# Patient Record
Sex: Male | Born: 1935 | Race: White | Hispanic: No | Marital: Married | Smoking: Never smoker
Health system: Southern US, Community
[De-identification: ages and names within clinical notes are randomized; demographics above are authoritative.]

## PROBLEM LIST (undated history)

## (undated) DIAGNOSIS — E785 Hyperlipidemia, unspecified: Secondary | ICD-10-CM

## (undated) DIAGNOSIS — J310 Chronic rhinitis: Secondary | ICD-10-CM

## (undated) DIAGNOSIS — J449 Chronic obstructive pulmonary disease, unspecified: Secondary | ICD-10-CM

## (undated) DIAGNOSIS — N189 Chronic kidney disease, unspecified: Secondary | ICD-10-CM

## (undated) DIAGNOSIS — D649 Anemia, unspecified: Secondary | ICD-10-CM

## (undated) DIAGNOSIS — I2510 Atherosclerotic heart disease of native coronary artery without angina pectoris: Secondary | ICD-10-CM

## (undated) DIAGNOSIS — C189 Malignant neoplasm of colon, unspecified: Secondary | ICD-10-CM

## (undated) HISTORY — DX: Anemia, unspecified: D64.9

## (undated) HISTORY — DX: Atherosclerotic heart disease of native coronary artery without angina pectoris: I25.10

## (undated) HISTORY — PX: COLON SURGERY: SHX602

## (undated) HISTORY — DX: Chronic rhinitis: J31.0

## (undated) HISTORY — DX: Chronic obstructive pulmonary disease, unspecified: J44.9

## (undated) HISTORY — DX: Chronic kidney disease, unspecified: N18.9

## (undated) HISTORY — DX: Hyperlipidemia, unspecified: E78.5

## (undated) HISTORY — DX: Malignant neoplasm of colon, unspecified: C18.9

---

## 2004-05-13 HISTORY — PX: COLONOSCOPY: SHX174

## 2005-08-11 ENCOUNTER — Other Ambulatory Visit: Payer: Self-pay

## 2005-08-11 ENCOUNTER — Ambulatory Visit: Payer: Self-pay | Admitting: Orthopedic Surgery

## 2005-08-18 ENCOUNTER — Ambulatory Visit: Payer: Self-pay | Admitting: Orthopedic Surgery

## 2005-09-08 ENCOUNTER — Ambulatory Visit: Payer: Self-pay | Admitting: Orthopedic Surgery

## 2006-12-20 ENCOUNTER — Ambulatory Visit: Payer: Self-pay | Admitting: Ophthalmology

## 2006-12-20 ENCOUNTER — Other Ambulatory Visit: Payer: Self-pay

## 2006-12-27 ENCOUNTER — Ambulatory Visit: Payer: Self-pay | Admitting: Ophthalmology

## 2007-02-15 HISTORY — PX: CORONARY ANGIOPLASTY: SHX604

## 2007-02-22 ENCOUNTER — Inpatient Hospital Stay: Payer: Self-pay | Admitting: Internal Medicine

## 2007-02-22 ENCOUNTER — Other Ambulatory Visit: Payer: Self-pay

## 2007-02-24 ENCOUNTER — Other Ambulatory Visit: Payer: Self-pay

## 2007-02-26 ENCOUNTER — Inpatient Hospital Stay: Payer: Self-pay | Admitting: Internal Medicine

## 2007-02-26 ENCOUNTER — Other Ambulatory Visit: Payer: Self-pay

## 2007-02-27 ENCOUNTER — Other Ambulatory Visit: Payer: Self-pay

## 2007-03-06 ENCOUNTER — Emergency Department: Payer: Self-pay | Admitting: Emergency Medicine

## 2007-11-26 ENCOUNTER — Inpatient Hospital Stay: Payer: Self-pay | Admitting: Internal Medicine

## 2007-11-26 ENCOUNTER — Other Ambulatory Visit: Payer: Self-pay

## 2008-01-25 ENCOUNTER — Emergency Department: Payer: Self-pay

## 2008-09-30 ENCOUNTER — Ambulatory Visit: Payer: Self-pay | Admitting: Family Medicine

## 2008-11-20 ENCOUNTER — Emergency Department: Payer: Self-pay | Admitting: Emergency Medicine

## 2009-08-05 ENCOUNTER — Inpatient Hospital Stay: Payer: Self-pay | Admitting: Internal Medicine

## 2010-12-01 ENCOUNTER — Ambulatory Visit: Payer: Self-pay | Admitting: Ophthalmology

## 2010-12-22 ENCOUNTER — Ambulatory Visit: Payer: Self-pay | Admitting: Ophthalmology

## 2011-02-03 ENCOUNTER — Inpatient Hospital Stay: Payer: Self-pay | Admitting: Internal Medicine

## 2013-12-07 DIAGNOSIS — J449 Chronic obstructive pulmonary disease, unspecified: Secondary | ICD-10-CM | POA: Insufficient documentation

## 2014-04-19 ENCOUNTER — Ambulatory Visit: Payer: Self-pay | Admitting: Specialist

## 2014-05-09 ENCOUNTER — Inpatient Hospital Stay: Payer: Self-pay | Admitting: Internal Medicine

## 2014-05-09 DIAGNOSIS — D649 Anemia, unspecified: Secondary | ICD-10-CM | POA: Insufficient documentation

## 2014-05-09 DIAGNOSIS — I2510 Atherosclerotic heart disease of native coronary artery without angina pectoris: Secondary | ICD-10-CM | POA: Insufficient documentation

## 2014-05-09 DIAGNOSIS — K219 Gastro-esophageal reflux disease without esophagitis: Secondary | ICD-10-CM | POA: Insufficient documentation

## 2014-05-09 DIAGNOSIS — E782 Mixed hyperlipidemia: Secondary | ICD-10-CM | POA: Insufficient documentation

## 2014-05-09 DIAGNOSIS — R0602 Shortness of breath: Secondary | ICD-10-CM | POA: Insufficient documentation

## 2014-05-09 LAB — CBC WITH DIFFERENTIAL/PLATELET
Basophil #: 0.1 10*3/uL (ref 0.0–0.1)
Basophil %: 1.3 %
EOS PCT: 1.1 %
Eosinophil #: 0.1 10*3/uL (ref 0.0–0.7)
HCT: 20.3 % — ABNORMAL LOW (ref 40.0–52.0)
HGB: 5.4 g/dL — AB (ref 13.0–18.0)
Lymphocyte #: 1.2 10*3/uL (ref 1.0–3.6)
Lymphocyte %: 23.9 %
MCH: 14.8 pg — AB (ref 26.0–34.0)
MCHC: 26.6 g/dL — ABNORMAL LOW (ref 32.0–36.0)
MCV: 56 fL — ABNORMAL LOW (ref 80–100)
MONOS PCT: 16.2 %
Monocyte #: 0.8 x10 3/mm (ref 0.2–1.0)
NEUTROS ABS: 3 10*3/uL (ref 1.4–6.5)
Neutrophil %: 57.5 %
Platelet: 194 10*3/uL (ref 150–440)
RBC: 3.63 10*6/uL — ABNORMAL LOW (ref 4.40–5.90)
RDW: 20.5 % — ABNORMAL HIGH (ref 11.5–14.5)
WBC: 5.2 10*3/uL (ref 3.8–10.6)

## 2014-05-09 LAB — FERRITIN: Ferritin (ARMC): 3 ng/mL — ABNORMAL LOW (ref 8–388)

## 2014-05-09 LAB — IRON AND TIBC
IRON BIND. CAP.(TOTAL): 453 ug/dL — AB (ref 250–450)
IRON SATURATION: 4 %
Iron: 16 ug/dL — ABNORMAL LOW (ref 65–175)
Unbound Iron-Bind.Cap.: 437 ug/dL

## 2014-05-09 LAB — COMPREHENSIVE METABOLIC PANEL
ALT: 20 U/L
ANION GAP: 8 (ref 7–16)
Albumin: 3.4 g/dL (ref 3.4–5.0)
Alkaline Phosphatase: 56 U/L
BILIRUBIN TOTAL: 0.5 mg/dL (ref 0.2–1.0)
BUN: 18 mg/dL (ref 7–18)
CHLORIDE: 106 mmol/L (ref 98–107)
CO2: 27 mmol/L (ref 21–32)
CREATININE: 1.23 mg/dL (ref 0.60–1.30)
Calcium, Total: 8.7 mg/dL (ref 8.5–10.1)
EGFR (Non-African Amer.): 58 — ABNORMAL LOW
Glucose: 84 mg/dL (ref 65–99)
OSMOLALITY: 282 (ref 275–301)
POTASSIUM: 4.2 mmol/L (ref 3.5–5.1)
SGOT(AST): 31 U/L (ref 15–37)
Sodium: 141 mmol/L (ref 136–145)
TOTAL PROTEIN: 7 g/dL (ref 6.4–8.2)

## 2014-05-09 LAB — TROPONIN I: Troponin-I: <0.02 ng/mL

## 2014-05-09 LAB — FOLATE: Folic Acid: 65.4 ng/mL — ABNORMAL HIGH (ref 3.1–17.5)

## 2014-05-10 LAB — CBC WITH DIFFERENTIAL/PLATELET
BASOS PCT: 1.5 %
Basophil #: 0.1 10*3/uL (ref 0.0–0.1)
EOS ABS: 0.2 10*3/uL (ref 0.0–0.7)
Eosinophil %: 3.1 %
HCT: 21.4 % — ABNORMAL LOW (ref 40.0–52.0)
HGB: 6.2 g/dL — ABNORMAL LOW (ref 13.0–18.0)
LYMPHS PCT: 19.6 %
Lymphocyte #: 1.3 10*3/uL (ref 1.0–3.6)
MCH: 17.5 pg — ABNORMAL LOW (ref 26.0–34.0)
MCHC: 28.7 g/dL — AB (ref 32.0–36.0)
MCV: 61 fL — AB (ref 80–100)
Monocyte #: 1.3 x10 3/mm — ABNORMAL HIGH (ref 0.2–1.0)
Monocyte %: 19.7 %
Neutrophil #: 3.6 10*3/uL (ref 1.4–6.5)
Neutrophil %: 56.1 %
Platelet: 149 10*3/uL — ABNORMAL LOW (ref 150–440)
RBC: 3.53 10*6/uL — ABNORMAL LOW (ref 4.40–5.90)
RDW: 25.6 % — ABNORMAL HIGH (ref 11.5–14.5)
WBC: 6.5 10*3/uL (ref 3.8–10.6)

## 2014-05-10 LAB — OCCULT BLOOD X 1 CARD TO LAB, STOOL: OCCULT BLOOD, FECES: POSITIVE

## 2014-05-11 HISTORY — PX: ESOPHAGOGASTRODUODENOSCOPY: SHX1529

## 2014-05-11 LAB — CBC WITH DIFFERENTIAL/PLATELET
BASOS ABS: 0.1 10*3/uL (ref 0.0–0.1)
Basophil %: 1.7 %
EOS ABS: 0.3 10*3/uL (ref 0.0–0.7)
Eosinophil %: 4.1 %
HCT: 25.4 % — AB (ref 40.0–52.0)
HGB: 7.2 g/dL — AB (ref 13.0–18.0)
LYMPHS PCT: 21.5 %
Lymphocyte #: 1.7 10*3/uL (ref 1.0–3.6)
MCH: 18.1 pg — AB (ref 26.0–34.0)
MCHC: 28.2 g/dL — AB (ref 32.0–36.0)
MCV: 64 fL — AB (ref 80–100)
Monocyte #: 1.7 x10 3/mm — ABNORMAL HIGH (ref 0.2–1.0)
Monocyte %: 20.8 %
Neutrophil #: 4.1 10*3/uL (ref 1.4–6.5)
Neutrophil %: 51.9 %
Platelet: 158 10*3/uL (ref 150–440)
RBC: 3.97 10*6/uL — ABNORMAL LOW (ref 4.40–5.90)
RDW: 28.6 % — ABNORMAL HIGH (ref 11.5–14.5)
WBC: 7.9 10*3/uL (ref 3.8–10.6)

## 2014-05-12 LAB — CBC WITH DIFFERENTIAL/PLATELET
BASOS ABS: 0.1 10*3/uL (ref 0.0–0.1)
Basophil %: 1.1 %
EOS PCT: 3.3 %
Eosinophil #: 0.3 10*3/uL (ref 0.0–0.7)
HCT: 28.6 % — AB (ref 40.0–52.0)
HGB: 8.4 g/dL — ABNORMAL LOW (ref 13.0–18.0)
LYMPHS ABS: 1.4 10*3/uL (ref 1.0–3.6)
Lymphocyte %: 13.4 %
MCH: 19.4 pg — ABNORMAL LOW (ref 26.0–34.0)
MCHC: 29.3 g/dL — AB (ref 32.0–36.0)
MCV: 66 fL — ABNORMAL LOW (ref 80–100)
Monocyte #: 1.4 x10 3/mm — ABNORMAL HIGH (ref 0.2–1.0)
Monocyte %: 13.2 %
Neutrophil #: 7.3 10*3/uL — ABNORMAL HIGH (ref 1.4–6.5)
Neutrophil %: 69 %
Platelet: 165 10*3/uL (ref 150–440)
RBC: 4.33 10*6/uL — ABNORMAL LOW (ref 4.40–5.90)
RDW: 32.2 % — ABNORMAL HIGH (ref 11.5–14.5)
WBC: 10.6 10*3/uL (ref 3.8–10.6)

## 2014-05-13 LAB — CBC WITH DIFFERENTIAL/PLATELET
BASOS ABS: 0.1 10*3/uL (ref 0.0–0.1)
Basophil %: 1 %
EOS ABS: 0.2 10*3/uL (ref 0.0–0.7)
Eosinophil %: 2 %
HCT: 29.7 % — ABNORMAL LOW (ref 40.0–52.0)
HGB: 8.7 g/dL — ABNORMAL LOW (ref 13.0–18.0)
LYMPHS ABS: 1 10*3/uL (ref 1.0–3.6)
LYMPHS PCT: 11.7 %
MCH: 19.3 pg — ABNORMAL LOW (ref 26.0–34.0)
MCHC: 29.2 g/dL — ABNORMAL LOW (ref 32.0–36.0)
MCV: 66 fL — ABNORMAL LOW (ref 80–100)
MONO ABS: 1.3 x10 3/mm — AB (ref 0.2–1.0)
Monocyte %: 15.6 %
NEUTROS PCT: 69.7 %
Neutrophil #: 5.8 10*3/uL (ref 1.4–6.5)
Platelet: 172 10*3/uL (ref 150–440)
RBC: 4.49 10*6/uL (ref 4.40–5.90)
RDW: 33 % — ABNORMAL HIGH (ref 11.5–14.5)
WBC: 8.2 10*3/uL (ref 3.8–10.6)

## 2014-05-14 HISTORY — PX: FLEXIBLE SIGMOIDOSCOPY: SHX1649

## 2014-05-14 LAB — HEPATIC FUNCTION PANEL A (ARMC)
AST: 24 U/L (ref 15–37)
Albumin: 2.9 g/dL — ABNORMAL LOW (ref 3.4–5.0)
Alkaline Phosphatase: 60 U/L
BILIRUBIN DIRECT: 0.2 mg/dL (ref 0.0–0.2)
Bilirubin,Total: 0.9 mg/dL (ref 0.2–1.0)
SGPT (ALT): 16 U/L
TOTAL PROTEIN: 6.2 g/dL — AB (ref 6.4–8.2)

## 2014-05-14 LAB — CBC WITH DIFFERENTIAL/PLATELET
Basophil #: 0.1 10*3/uL (ref 0.0–0.1)
Basophil %: 1 %
Eosinophil #: 0.1 10*3/uL (ref 0.0–0.7)
Eosinophil %: 1.2 %
HCT: 28.2 % — ABNORMAL LOW (ref 40.0–52.0)
HGB: 8.2 g/dL — AB (ref 13.0–18.0)
Lymphocyte #: 1 10*3/uL (ref 1.0–3.6)
Lymphocyte %: 11.7 %
MCH: 19.4 pg — AB (ref 26.0–34.0)
MCHC: 29.2 g/dL — AB (ref 32.0–36.0)
MCV: 66 fL — ABNORMAL LOW (ref 80–100)
MONO ABS: 1.5 x10 3/mm — AB (ref 0.2–1.0)
MONOS PCT: 16.7 %
NEUTROS ABS: 6 10*3/uL (ref 1.4–6.5)
NEUTROS PCT: 69.4 %
PLATELETS: 149 10*3/uL — AB (ref 150–440)
RBC: 4.25 10*6/uL — AB (ref 4.40–5.90)
RDW: 33.8 % — ABNORMAL HIGH (ref 11.5–14.5)
WBC: 8.7 10*3/uL (ref 3.8–10.6)

## 2014-05-14 LAB — BASIC METABOLIC PANEL
Anion Gap: 7 (ref 7–16)
BUN: 7 mg/dL (ref 7–18)
CREATININE: 1.17 mg/dL (ref 0.60–1.30)
Calcium, Total: 8.2 mg/dL — ABNORMAL LOW (ref 8.5–10.1)
Chloride: 109 mmol/L — ABNORMAL HIGH (ref 98–107)
Co2: 26 mmol/L (ref 21–32)
EGFR (Non-African Amer.): >60
Glucose: 79 mg/dL (ref 65–99)
OSMOLALITY: 280 (ref 275–301)
POTASSIUM: 3.6 mmol/L (ref 3.5–5.1)
Sodium: 142 mmol/L (ref 136–145)

## 2014-05-15 LAB — HEMOGLOBIN: HGB: 8.7 g/dL — AB (ref 13.0–18.0)

## 2014-05-16 LAB — BASIC METABOLIC PANEL
Anion Gap: 7 (ref 7–16)
BUN: 11 mg/dL (ref 7–18)
CHLORIDE: 108 mmol/L — AB (ref 98–107)
Calcium, Total: 8.7 mg/dL (ref 8.5–10.1)
Co2: 26 mmol/L (ref 21–32)
Creatinine: 1.23 mg/dL (ref 0.60–1.30)
EGFR (African American): >60
EGFR (Non-African Amer.): 58 — ABNORMAL LOW
Glucose: 92 mg/dL (ref 65–99)
OSMOLALITY: 280 (ref 275–301)
Potassium: 3.6 mmol/L (ref 3.5–5.1)
Sodium: 141 mmol/L (ref 136–145)

## 2014-05-16 LAB — URIC ACID: Uric Acid: 6 mg/dL (ref 3.5–7.2)

## 2014-05-16 LAB — CBC WITH DIFFERENTIAL/PLATELET
BASOS ABS: 0 10*3/uL (ref 0.0–0.1)
Basophil %: 0.4 %
EOS ABS: 0 10*3/uL (ref 0.0–0.7)
Eosinophil %: 0.5 %
HCT: 29.2 % — AB (ref 40.0–52.0)
HGB: 8.5 g/dL — ABNORMAL LOW (ref 13.0–18.0)
LYMPHS ABS: 1.3 10*3/uL (ref 1.0–3.6)
Lymphocyte %: 17.7 %
MCH: 19.5 pg — ABNORMAL LOW (ref 26.0–34.0)
MCHC: 29.2 g/dL — ABNORMAL LOW (ref 32.0–36.0)
MCV: 67 fL — AB (ref 80–100)
Monocyte #: 1.2 x10 3/mm — ABNORMAL HIGH (ref 0.2–1.0)
Monocyte %: 15.4 %
Neutrophil #: 5 10*3/uL (ref 1.4–6.5)
Neutrophil %: 66 %
PLATELETS: 132 10*3/uL — AB (ref 150–440)
RBC: 4.36 10*6/uL — ABNORMAL LOW (ref 4.40–5.90)
RDW: 33.5 % — AB (ref 11.5–14.5)
WBC: 7.5 10*3/uL (ref 3.8–10.6)

## 2014-05-16 LAB — PROTIME-INR
INR: 1.1
Prothrombin Time: 14.2 secs (ref 11.5–14.7)

## 2014-05-16 LAB — CEA: CEA: 2 ng/mL (ref 0.0–4.7)

## 2014-05-17 ENCOUNTER — Ambulatory Visit: Payer: Self-pay | Admitting: Oncology

## 2014-05-17 DIAGNOSIS — K922 Gastrointestinal hemorrhage, unspecified: Secondary | ICD-10-CM | POA: Diagnosis not present

## 2014-05-17 DIAGNOSIS — D649 Anemia, unspecified: Secondary | ICD-10-CM | POA: Diagnosis not present

## 2014-05-17 DIAGNOSIS — R4182 Altered mental status, unspecified: Secondary | ICD-10-CM | POA: Diagnosis not present

## 2014-05-17 DIAGNOSIS — C189 Malignant neoplasm of colon, unspecified: Secondary | ICD-10-CM | POA: Diagnosis not present

## 2014-05-17 LAB — COMPREHENSIVE METABOLIC PANEL
ALBUMIN: 2.5 g/dL — AB (ref 3.4–5.0)
ANION GAP: 3 — AB (ref 7–16)
AST: 20 U/L (ref 15–37)
Alkaline Phosphatase: 51 U/L
BILIRUBIN TOTAL: 0.5 mg/dL (ref 0.2–1.0)
BUN: 14 mg/dL (ref 7–18)
CHLORIDE: 109 mmol/L — AB (ref 98–107)
CREATININE: 1.54 mg/dL — AB (ref 0.60–1.30)
Calcium, Total: 7.7 mg/dL — ABNORMAL LOW (ref 8.5–10.1)
Co2: 28 mmol/L (ref 21–32)
EGFR (African American): 55 — ABNORMAL LOW
EGFR (Non-African Amer.): 45 — ABNORMAL LOW
GLUCOSE: 119 mg/dL — AB (ref 65–99)
OSMOLALITY: 281 (ref 275–301)
POTASSIUM: 4.3 mmol/L (ref 3.5–5.1)
SGPT (ALT): 16 U/L
SODIUM: 140 mmol/L (ref 136–145)
Total Protein: 5.8 g/dL — ABNORMAL LOW (ref 6.4–8.2)

## 2014-05-17 LAB — CBC WITH DIFFERENTIAL/PLATELET
BASOS ABS: 1 %
Bands: 3 %
Eosinophil: 1 %
HCT: 27.9 % — AB (ref 40.0–52.0)
HGB: 7.9 g/dL — AB (ref 13.0–18.0)
LYMPHS PCT: 4 %
MCH: 19.3 pg — ABNORMAL LOW (ref 26.0–34.0)
MCHC: 28.5 g/dL — ABNORMAL LOW (ref 32.0–36.0)
MCV: 68 fL — ABNORMAL LOW (ref 80–100)
MONOS PCT: 8 %
Platelet: 141 10*3/uL — ABNORMAL LOW (ref 150–440)
RBC: 4.12 10*6/uL — ABNORMAL LOW (ref 4.40–5.90)
RDW: 34.2 % — AB (ref 11.5–14.5)
SEGMENTED NEUTROPHILS: 83 %
WBC: 27 10*3/uL — AB (ref 3.8–10.6)

## 2014-05-18 DIAGNOSIS — K922 Gastrointestinal hemorrhage, unspecified: Secondary | ICD-10-CM | POA: Diagnosis not present

## 2014-05-18 DIAGNOSIS — R4182 Altered mental status, unspecified: Secondary | ICD-10-CM | POA: Diagnosis not present

## 2014-05-18 DIAGNOSIS — D649 Anemia, unspecified: Secondary | ICD-10-CM | POA: Diagnosis not present

## 2014-05-18 DIAGNOSIS — C189 Malignant neoplasm of colon, unspecified: Secondary | ICD-10-CM | POA: Diagnosis not present

## 2014-05-18 LAB — CBC WITH DIFFERENTIAL/PLATELET
BASOS PCT: 0.6 %
Basophil #: 0.1 10*3/uL (ref 0.0–0.1)
EOS PCT: 0.5 %
Eosinophil #: 0.1 10*3/uL (ref 0.0–0.7)
HCT: 23.3 % — AB (ref 40.0–52.0)
HGB: 6.6 g/dL — ABNORMAL LOW (ref 13.0–18.0)
LYMPHS ABS: 1 10*3/uL (ref 1.0–3.6)
Lymphocyte %: 7.6 %
MCH: 19.4 pg — ABNORMAL LOW (ref 26.0–34.0)
MCHC: 28.4 g/dL — ABNORMAL LOW (ref 32.0–36.0)
MCV: 68 fL — AB (ref 80–100)
Monocyte #: 2.4 x10 3/mm — ABNORMAL HIGH (ref 0.2–1.0)
Monocyte %: 17.3 %
NEUTROS ABS: 10 10*3/uL — AB (ref 1.4–6.5)
Neutrophil %: 74 %
PLATELETS: 116 10*3/uL — AB (ref 150–440)
RBC: 3.42 10*6/uL — AB (ref 4.40–5.90)
RDW: 33.2 % — ABNORMAL HIGH (ref 11.5–14.5)
WBC: 13.6 10*3/uL — ABNORMAL HIGH (ref 3.8–10.6)

## 2014-05-18 LAB — BASIC METABOLIC PANEL
Anion Gap: 5 — ABNORMAL LOW (ref 7–16)
BUN: 13 mg/dL (ref 7–18)
CHLORIDE: 107 mmol/L (ref 98–107)
CO2: 26 mmol/L (ref 21–32)
CREATININE: 1.15 mg/dL (ref 0.60–1.30)
Calcium, Total: 7.5 mg/dL — ABNORMAL LOW (ref 8.5–10.1)
EGFR (African American): >60
EGFR (Non-African Amer.): >60
Glucose: 91 mg/dL (ref 65–99)
Osmolality: 275 (ref 275–301)
Potassium: 3.9 mmol/L (ref 3.5–5.1)
SODIUM: 138 mmol/L (ref 136–145)

## 2014-05-19 DIAGNOSIS — R4182 Altered mental status, unspecified: Secondary | ICD-10-CM | POA: Diagnosis not present

## 2014-05-19 DIAGNOSIS — C189 Malignant neoplasm of colon, unspecified: Secondary | ICD-10-CM | POA: Diagnosis not present

## 2014-05-19 DIAGNOSIS — K922 Gastrointestinal hemorrhage, unspecified: Secondary | ICD-10-CM | POA: Diagnosis not present

## 2014-05-19 DIAGNOSIS — D649 Anemia, unspecified: Secondary | ICD-10-CM | POA: Diagnosis not present

## 2014-05-19 LAB — CBC WITH DIFFERENTIAL/PLATELET
BASOS PCT: 0.6 %
Basophil #: 0.1 10*3/uL (ref 0.0–0.1)
EOS PCT: 2.6 %
Eosinophil #: 0.2 10*3/uL (ref 0.0–0.7)
HCT: 28.1 % — AB (ref 40.0–52.0)
HGB: 8.4 g/dL — AB (ref 13.0–18.0)
LYMPHS ABS: 0.8 10*3/uL — AB (ref 1.0–3.6)
Lymphocyte %: 8.7 %
MCH: 21.5 pg — AB (ref 26.0–34.0)
MCHC: 30 g/dL — ABNORMAL LOW (ref 32.0–36.0)
MCV: 71 fL — AB (ref 80–100)
Monocyte #: 1.4 x10 3/mm — ABNORMAL HIGH (ref 0.2–1.0)
Monocyte %: 14.9 %
Neutrophil #: 6.9 10*3/uL — ABNORMAL HIGH (ref 1.4–6.5)
Neutrophil %: 73.2 %
Platelet: 132 10*3/uL — ABNORMAL LOW (ref 150–440)
RBC: 3.93 10*6/uL — AB (ref 4.40–5.90)
RDW: 33.5 % — ABNORMAL HIGH (ref 11.5–14.5)
WBC: 9.5 10*3/uL (ref 3.8–10.6)

## 2014-05-19 LAB — BASIC METABOLIC PANEL
Anion Gap: 5 — ABNORMAL LOW (ref 7–16)
BUN: 9 mg/dL (ref 7–18)
CHLORIDE: 103 mmol/L (ref 98–107)
CO2: 29 mmol/L (ref 21–32)
CREATININE: 1.1 mg/dL (ref 0.60–1.30)
Calcium, Total: 7.7 mg/dL — ABNORMAL LOW (ref 8.5–10.1)
EGFR (African American): >60
EGFR (Non-African Amer.): >60
Glucose: 97 mg/dL (ref 65–99)
OSMOLALITY: 272 (ref 275–301)
Potassium: 4.1 mmol/L (ref 3.5–5.1)
Sodium: 137 mmol/L (ref 136–145)

## 2014-05-20 DIAGNOSIS — C189 Malignant neoplasm of colon, unspecified: Secondary | ICD-10-CM | POA: Diagnosis not present

## 2014-05-20 DIAGNOSIS — K922 Gastrointestinal hemorrhage, unspecified: Secondary | ICD-10-CM | POA: Diagnosis not present

## 2014-05-20 DIAGNOSIS — C19 Malignant neoplasm of rectosigmoid junction: Secondary | ICD-10-CM | POA: Diagnosis not present

## 2014-05-20 DIAGNOSIS — R4182 Altered mental status, unspecified: Secondary | ICD-10-CM | POA: Diagnosis not present

## 2014-05-20 DIAGNOSIS — D649 Anemia, unspecified: Secondary | ICD-10-CM | POA: Diagnosis not present

## 2014-05-20 DIAGNOSIS — D509 Iron deficiency anemia, unspecified: Secondary | ICD-10-CM | POA: Diagnosis not present

## 2014-05-20 LAB — CBC WITH DIFFERENTIAL/PLATELET
Basophil #: 0.1 10*3/uL (ref 0.0–0.1)
Basophil %: 0.6 %
EOS ABS: 0.3 10*3/uL (ref 0.0–0.7)
Eosinophil %: 2.7 %
HCT: 29.5 % — ABNORMAL LOW (ref 40.0–52.0)
HGB: 8.9 g/dL — ABNORMAL LOW (ref 13.0–18.0)
LYMPHS ABS: 0.8 10*3/uL — AB (ref 1.0–3.6)
Lymphocyte %: 6.6 %
MCH: 21.3 pg — AB (ref 26.0–34.0)
MCHC: 30.2 g/dL — AB (ref 32.0–36.0)
MCV: 71 fL — AB (ref 80–100)
MONO ABS: 1.6 x10 3/mm — AB (ref 0.2–1.0)
Monocyte %: 12.7 %
Neutrophil #: 9.9 10*3/uL — ABNORMAL HIGH (ref 1.4–6.5)
Neutrophil %: 77.4 %
PLATELETS: 201 10*3/uL (ref 150–440)
RBC: 4.18 10*6/uL — ABNORMAL LOW (ref 4.40–5.90)
RDW: 33.8 % — AB (ref 11.5–14.5)
WBC: 12.8 10*3/uL — AB (ref 3.8–10.6)

## 2014-05-20 LAB — IRON AND TIBC
IRON BIND. CAP.(TOTAL): 243 ug/dL — AB (ref 250–450)
Iron Saturation: 9 %
Iron: 21 ug/dL — ABNORMAL LOW (ref 65–175)
UNBOUND IRON-BIND. CAP.: 222 ug/dL

## 2014-05-20 LAB — FERRITIN: Ferritin (ARMC): 108 ng/mL (ref 8–388)

## 2014-05-21 DIAGNOSIS — K922 Gastrointestinal hemorrhage, unspecified: Secondary | ICD-10-CM | POA: Diagnosis not present

## 2014-05-21 DIAGNOSIS — R4182 Altered mental status, unspecified: Secondary | ICD-10-CM | POA: Diagnosis not present

## 2014-05-21 DIAGNOSIS — C19 Malignant neoplasm of rectosigmoid junction: Secondary | ICD-10-CM | POA: Diagnosis not present

## 2014-05-21 DIAGNOSIS — Z515 Encounter for palliative care: Secondary | ICD-10-CM | POA: Diagnosis not present

## 2014-05-21 DIAGNOSIS — D649 Anemia, unspecified: Secondary | ICD-10-CM | POA: Diagnosis not present

## 2014-05-21 DIAGNOSIS — C189 Malignant neoplasm of colon, unspecified: Secondary | ICD-10-CM | POA: Diagnosis not present

## 2014-05-21 LAB — CBC WITH DIFFERENTIAL/PLATELET
Eosinophil: 3 %
HCT: 30 % — AB (ref 40.0–52.0)
HGB: 9 g/dL — AB (ref 13.0–18.0)
Lymphocytes: 8 %
MCH: 21.3 pg — AB (ref 26.0–34.0)
MCHC: 30 g/dL — AB (ref 32.0–36.0)
MCV: 71 fL — ABNORMAL LOW (ref 80–100)
Monocytes: 12 %
Platelet: 234 10*3/uL (ref 150–440)
RBC: 4.23 10*6/uL — ABNORMAL LOW (ref 4.40–5.90)
RDW: 34.8 % — AB (ref 11.5–14.5)
Segmented Neutrophils: 77 %
WBC: 14 10*3/uL — ABNORMAL HIGH (ref 3.8–10.6)

## 2014-05-21 LAB — COMPREHENSIVE METABOLIC PANEL
ALBUMIN: 2.1 g/dL — AB (ref 3.4–5.0)
ANION GAP: 5 — AB (ref 7–16)
Alkaline Phosphatase: 66 U/L
BUN: 7 mg/dL (ref 7–18)
Bilirubin,Total: 0.6 mg/dL (ref 0.2–1.0)
CO2: 29 mmol/L (ref 21–32)
Calcium, Total: 8.2 mg/dL — ABNORMAL LOW (ref 8.5–10.1)
Chloride: 105 mmol/L (ref 98–107)
Creatinine: 0.96 mg/dL (ref 0.60–1.30)
EGFR (African American): >60
EGFR (Non-African Amer.): >60
GLUCOSE: 99 mg/dL (ref 65–99)
OSMOLALITY: 276 (ref 275–301)
Potassium: 4 mmol/L (ref 3.5–5.1)
SGOT(AST): 34 U/L (ref 15–37)
SGPT (ALT): 22 U/L
SODIUM: 139 mmol/L (ref 136–145)
Total Protein: 5.5 g/dL — ABNORMAL LOW (ref 6.4–8.2)

## 2014-05-22 DIAGNOSIS — C189 Malignant neoplasm of colon, unspecified: Secondary | ICD-10-CM | POA: Diagnosis not present

## 2014-05-22 DIAGNOSIS — D649 Anemia, unspecified: Secondary | ICD-10-CM | POA: Diagnosis not present

## 2014-05-22 DIAGNOSIS — R4182 Altered mental status, unspecified: Secondary | ICD-10-CM | POA: Diagnosis not present

## 2014-05-22 DIAGNOSIS — K922 Gastrointestinal hemorrhage, unspecified: Secondary | ICD-10-CM | POA: Diagnosis not present

## 2014-05-22 LAB — CBC WITH DIFFERENTIAL/PLATELET
Basophil #: 0.1 10*3/uL (ref 0.0–0.1)
Basophil %: 1.1 %
EOS ABS: 0.6 10*3/uL (ref 0.0–0.7)
Eosinophil %: 4.5 %
HCT: 30.8 % — AB (ref 40.0–52.0)
HGB: 9.2 g/dL — AB (ref 13.0–18.0)
LYMPHS ABS: 1.4 10*3/uL (ref 1.0–3.6)
Lymphocyte %: 11.2 %
MCH: 21.3 pg — AB (ref 26.0–34.0)
MCHC: 30 g/dL — ABNORMAL LOW (ref 32.0–36.0)
MCV: 71 fL — AB (ref 80–100)
Monocyte #: 1.9 x10 3/mm — ABNORMAL HIGH (ref 0.2–1.0)
Monocyte %: 15.1 %
Neutrophil #: 8.6 10*3/uL — ABNORMAL HIGH (ref 1.4–6.5)
Neutrophil %: 68.1 %
Platelet: 279 10*3/uL (ref 150–440)
RBC: 4.33 10*6/uL — AB (ref 4.40–5.90)
RDW: 35.3 % — ABNORMAL HIGH (ref 11.5–14.5)
WBC: 12.6 10*3/uL — ABNORMAL HIGH (ref 3.8–10.6)

## 2014-05-22 LAB — BASIC METABOLIC PANEL
Anion Gap: 6 — ABNORMAL LOW (ref 7–16)
BUN: 7 mg/dL (ref 7–18)
CALCIUM: 8.4 mg/dL — AB (ref 8.5–10.1)
CHLORIDE: 109 mmol/L — AB (ref 98–107)
CREATININE: 1.02 mg/dL (ref 0.60–1.30)
Co2: 27 mmol/L (ref 21–32)
EGFR (African American): >60
EGFR (Non-African Amer.): >60
Glucose: 95 mg/dL (ref 65–99)
OSMOLALITY: 281 (ref 275–301)
POTASSIUM: 3.8 mmol/L (ref 3.5–5.1)
Sodium: 142 mmol/L (ref 136–145)

## 2014-05-23 ENCOUNTER — Ambulatory Visit: Payer: Self-pay | Admitting: Oncology

## 2014-05-23 ENCOUNTER — Encounter: Payer: Self-pay | Admitting: Internal Medicine

## 2014-05-23 DIAGNOSIS — C189 Malignant neoplasm of colon, unspecified: Secondary | ICD-10-CM | POA: Diagnosis not present

## 2014-05-23 DIAGNOSIS — D649 Anemia, unspecified: Secondary | ICD-10-CM | POA: Diagnosis not present

## 2014-05-23 DIAGNOSIS — J441 Chronic obstructive pulmonary disease with (acute) exacerbation: Secondary | ICD-10-CM | POA: Diagnosis not present

## 2014-05-28 DIAGNOSIS — D649 Anemia, unspecified: Secondary | ICD-10-CM | POA: Diagnosis not present

## 2014-05-28 LAB — CBC WITH DIFFERENTIAL/PLATELET
BASOS ABS: 2 %
EOS PCT: 3 %
HCT: 35.2 % — ABNORMAL LOW (ref 40.0–52.0)
HGB: 10.5 g/dL — ABNORMAL LOW (ref 13.0–18.0)
Lymphocytes: 11 %
MCH: 22.4 pg — ABNORMAL LOW (ref 26.0–34.0)
MCHC: 29.9 g/dL — AB (ref 32.0–36.0)
MCV: 75 fL — ABNORMAL LOW (ref 80–100)
MONOS PCT: 8 %
Metamyelocyte: 1 %
Platelet: 353 10*3/uL (ref 150–440)
RBC: 4.7 10*6/uL (ref 4.40–5.90)
RDW: 37 % — AB (ref 11.5–14.5)
Segmented Neutrophils: 75 %
WBC: 14.3 10*3/uL — ABNORMAL HIGH (ref 3.8–10.6)

## 2014-05-28 LAB — BASIC METABOLIC PANEL
ANION GAP: 9 (ref 7–16)
BUN: 19 mg/dL — ABNORMAL HIGH (ref 7–18)
CALCIUM: 8.9 mg/dL (ref 8.5–10.1)
Chloride: 104 mmol/L (ref 98–107)
Co2: 25 mmol/L (ref 21–32)
Creatinine: 1.28 mg/dL (ref 0.60–1.30)
GFR CALC NON AF AMER: 56 — AB
GLUCOSE: 73 mg/dL (ref 65–99)
OSMOLALITY: 277 (ref 275–301)
Potassium: 4 mmol/L (ref 3.5–5.1)
Sodium: 138 mmol/L (ref 136–145)

## 2014-05-30 DIAGNOSIS — E782 Mixed hyperlipidemia: Secondary | ICD-10-CM | POA: Diagnosis not present

## 2014-05-30 DIAGNOSIS — I2510 Atherosclerotic heart disease of native coronary artery without angina pectoris: Secondary | ICD-10-CM | POA: Diagnosis not present

## 2014-05-30 DIAGNOSIS — J449 Chronic obstructive pulmonary disease, unspecified: Secondary | ICD-10-CM | POA: Diagnosis not present

## 2014-06-03 DIAGNOSIS — Z79899 Other long term (current) drug therapy: Secondary | ICD-10-CM | POA: Diagnosis not present

## 2014-06-03 DIAGNOSIS — D509 Iron deficiency anemia, unspecified: Secondary | ICD-10-CM | POA: Diagnosis not present

## 2014-06-03 DIAGNOSIS — I2510 Atherosclerotic heart disease of native coronary artery without angina pectoris: Secondary | ICD-10-CM | POA: Diagnosis not present

## 2014-06-03 DIAGNOSIS — K219 Gastro-esophageal reflux disease without esophagitis: Secondary | ICD-10-CM | POA: Diagnosis not present

## 2014-06-03 DIAGNOSIS — C19 Malignant neoplasm of rectosigmoid junction: Secondary | ICD-10-CM | POA: Diagnosis not present

## 2014-06-03 LAB — CBC CANCER CENTER
BASOS ABS: 0.1 x10 3/mm (ref 0.0–0.1)
BASOS PCT: 1.2 %
EOS PCT: 1.6 %
Eosinophil #: 0.2 x10 3/mm (ref 0.0–0.7)
HCT: 36.7 % — AB (ref 40.0–52.0)
HGB: 11.2 g/dL — ABNORMAL LOW (ref 13.0–18.0)
LYMPHS PCT: 14.7 %
Lymphocyte #: 1.6 x10 3/mm (ref 1.0–3.6)
MCH: 22.7 pg — ABNORMAL LOW (ref 26.0–34.0)
MCHC: 30.6 g/dL — ABNORMAL LOW (ref 32.0–36.0)
MCV: 74 fL — AB (ref 80–100)
MONOS PCT: 11.8 %
Monocyte #: 1.3 x10 3/mm — ABNORMAL HIGH (ref 0.2–1.0)
NEUTROS ABS: 7.9 x10 3/mm — AB (ref 1.4–6.5)
Neutrophil %: 70.7 %
Platelet: 484 x10 3/mm — ABNORMAL HIGH (ref 150–440)
RBC: 4.93 10*6/uL (ref 4.40–5.90)
RDW: 35.7 % — AB (ref 11.5–14.5)
WBC: 11.1 x10 3/mm — ABNORMAL HIGH (ref 3.8–10.6)

## 2014-06-04 DIAGNOSIS — D649 Anemia, unspecified: Secondary | ICD-10-CM | POA: Diagnosis not present

## 2014-06-04 LAB — BASIC METABOLIC PANEL
ANION GAP: 8 (ref 7–16)
BUN: 20 mg/dL — AB (ref 7–18)
CO2: 23 mmol/L (ref 21–32)
CREATININE: 1.22 mg/dL (ref 0.60–1.30)
Calcium, Total: 8.6 mg/dL (ref 8.5–10.1)
Chloride: 107 mmol/L (ref 98–107)
EGFR (Non-African Amer.): 59 — ABNORMAL LOW
Glucose: 75 mg/dL (ref 65–99)
Osmolality: 277 (ref 275–301)
POTASSIUM: 4.1 mmol/L (ref 3.5–5.1)
Sodium: 138 mmol/L (ref 136–145)

## 2014-06-04 LAB — CBC WITH DIFFERENTIAL/PLATELET
Basophil #: 0.1 10*3/uL (ref 0.0–0.1)
Basophil %: 1.4 %
EOS PCT: 2.7 %
Eosinophil #: 0.2 10*3/uL (ref 0.0–0.7)
HCT: 34.6 % — ABNORMAL LOW (ref 40.0–52.0)
HGB: 10.5 g/dL — ABNORMAL LOW (ref 13.0–18.0)
LYMPHS PCT: 16 %
Lymphocyte #: 1.3 10*3/uL (ref 1.0–3.6)
MCH: 23 pg — AB (ref 26.0–34.0)
MCHC: 30.3 g/dL — ABNORMAL LOW (ref 32.0–36.0)
MCV: 76 fL — ABNORMAL LOW (ref 80–100)
MONO ABS: 1.1 x10 3/mm — AB (ref 0.2–1.0)
Monocyte %: 13.6 %
NEUTROS PCT: 66.3 %
Neutrophil #: 5.6 10*3/uL (ref 1.4–6.5)
Platelet: 394 10*3/uL (ref 150–440)
RBC: 4.56 10*6/uL (ref 4.40–5.90)
RDW: 36.5 % — ABNORMAL HIGH (ref 11.5–14.5)
WBC: 8.4 10*3/uL (ref 3.8–10.6)

## 2014-06-17 ENCOUNTER — Ambulatory Visit: Payer: Self-pay | Admitting: Oncology

## 2014-06-17 ENCOUNTER — Encounter: Payer: Self-pay | Admitting: Internal Medicine

## 2014-06-17 DIAGNOSIS — I493 Ventricular premature depolarization: Secondary | ICD-10-CM | POA: Diagnosis not present

## 2014-06-17 DIAGNOSIS — R0602 Shortness of breath: Secondary | ICD-10-CM | POA: Diagnosis not present

## 2014-06-17 DIAGNOSIS — E782 Mixed hyperlipidemia: Secondary | ICD-10-CM | POA: Diagnosis not present

## 2014-06-17 DIAGNOSIS — I2510 Atherosclerotic heart disease of native coronary artery without angina pectoris: Secondary | ICD-10-CM | POA: Diagnosis not present

## 2014-06-25 DIAGNOSIS — R0602 Shortness of breath: Secondary | ICD-10-CM | POA: Diagnosis not present

## 2014-06-25 DIAGNOSIS — Z8709 Personal history of other diseases of the respiratory system: Secondary | ICD-10-CM | POA: Diagnosis not present

## 2014-06-25 DIAGNOSIS — J449 Chronic obstructive pulmonary disease, unspecified: Secondary | ICD-10-CM | POA: Diagnosis not present

## 2014-06-25 DIAGNOSIS — Z85038 Personal history of other malignant neoplasm of large intestine: Secondary | ICD-10-CM | POA: Diagnosis not present

## 2014-06-27 DIAGNOSIS — R0602 Shortness of breath: Secondary | ICD-10-CM | POA: Diagnosis not present

## 2014-06-30 DIAGNOSIS — I2510 Atherosclerotic heart disease of native coronary artery without angina pectoris: Secondary | ICD-10-CM | POA: Diagnosis not present

## 2014-06-30 DIAGNOSIS — J449 Chronic obstructive pulmonary disease, unspecified: Secondary | ICD-10-CM | POA: Diagnosis not present

## 2014-06-30 DIAGNOSIS — E782 Mixed hyperlipidemia: Secondary | ICD-10-CM | POA: Diagnosis not present

## 2014-07-08 DIAGNOSIS — I10 Essential (primary) hypertension: Secondary | ICD-10-CM | POA: Diagnosis not present

## 2014-07-08 DIAGNOSIS — I714 Abdominal aortic aneurysm, without rupture: Secondary | ICD-10-CM | POA: Diagnosis not present

## 2014-07-08 DIAGNOSIS — E785 Hyperlipidemia, unspecified: Secondary | ICD-10-CM | POA: Diagnosis not present

## 2014-07-08 DIAGNOSIS — J449 Chronic obstructive pulmonary disease, unspecified: Secondary | ICD-10-CM | POA: Diagnosis not present

## 2014-07-08 DIAGNOSIS — D509 Iron deficiency anemia, unspecified: Secondary | ICD-10-CM | POA: Diagnosis not present

## 2014-07-16 ENCOUNTER — Ambulatory Visit: Payer: Self-pay | Admitting: Surgery

## 2014-07-16 ENCOUNTER — Ambulatory Visit
Admit: 2014-07-16 | Disposition: A | Discharge status: Home / Self Care | Payer: Self-pay | Attending: Internal Medicine | Admitting: Internal Medicine

## 2014-07-16 ENCOUNTER — Encounter
Admit: 2014-07-16 | Disposition: A | Discharge status: Home / Self Care | Payer: Self-pay | Attending: Internal Medicine | Admitting: Internal Medicine

## 2014-07-16 DIAGNOSIS — Z01812 Encounter for preprocedural laboratory examination: Secondary | ICD-10-CM | POA: Diagnosis not present

## 2014-07-23 ENCOUNTER — Ambulatory Visit: Payer: Self-pay | Admitting: Podiatry

## 2014-07-25 ENCOUNTER — Inpatient Hospital Stay: Payer: Self-pay | Admitting: Surgery

## 2014-07-25 DIAGNOSIS — Z66 Do not resuscitate: Secondary | ICD-10-CM | POA: Diagnosis not present

## 2014-07-25 DIAGNOSIS — T8132XA Disruption of internal operation (surgical) wound, not elsewhere classified, initial encounter: Secondary | ICD-10-CM | POA: Diagnosis not present

## 2014-07-25 DIAGNOSIS — I2510 Atherosclerotic heart disease of native coronary artery without angina pectoris: Secondary | ICD-10-CM | POA: Diagnosis not present

## 2014-07-25 DIAGNOSIS — E782 Mixed hyperlipidemia: Secondary | ICD-10-CM | POA: Diagnosis not present

## 2014-07-25 DIAGNOSIS — Z432 Encounter for attention to ileostomy: Secondary | ICD-10-CM | POA: Diagnosis not present

## 2014-07-25 DIAGNOSIS — Z9981 Dependence on supplemental oxygen: Secondary | ICD-10-CM | POA: Diagnosis not present

## 2014-07-25 DIAGNOSIS — I480 Paroxysmal atrial fibrillation: Secondary | ICD-10-CM | POA: Diagnosis not present

## 2014-07-25 DIAGNOSIS — K219 Gastro-esophageal reflux disease without esophagitis: Secondary | ICD-10-CM | POA: Diagnosis not present

## 2014-07-25 DIAGNOSIS — I4891 Unspecified atrial fibrillation: Secondary | ICD-10-CM | POA: Diagnosis not present

## 2014-07-25 DIAGNOSIS — C189 Malignant neoplasm of colon, unspecified: Secondary | ICD-10-CM | POA: Diagnosis not present

## 2014-07-25 DIAGNOSIS — I110 Hypertensive heart disease with heart failure: Secondary | ICD-10-CM | POA: Diagnosis not present

## 2014-07-25 DIAGNOSIS — E789 Disorder of lipoprotein metabolism, unspecified: Secondary | ICD-10-CM | POA: Diagnosis not present

## 2014-07-25 DIAGNOSIS — Z515 Encounter for palliative care: Secondary | ICD-10-CM | POA: Diagnosis not present

## 2014-07-25 DIAGNOSIS — Z452 Encounter for adjustment and management of vascular access device: Secondary | ICD-10-CM | POA: Diagnosis not present

## 2014-07-25 DIAGNOSIS — T8130XA Disruption of wound, unspecified, initial encounter: Secondary | ICD-10-CM | POA: Diagnosis not present

## 2014-07-25 DIAGNOSIS — N400 Enlarged prostate without lower urinary tract symptoms: Secondary | ICD-10-CM | POA: Diagnosis not present

## 2014-07-25 DIAGNOSIS — M47815 Spondylosis without myelopathy or radiculopathy, thoracolumbar region: Secondary | ICD-10-CM | POA: Diagnosis not present

## 2014-07-25 DIAGNOSIS — J449 Chronic obstructive pulmonary disease, unspecified: Secondary | ICD-10-CM | POA: Diagnosis not present

## 2014-07-25 DIAGNOSIS — D649 Anemia, unspecified: Secondary | ICD-10-CM | POA: Diagnosis not present

## 2014-07-25 DIAGNOSIS — I214 Non-ST elevation (NSTEMI) myocardial infarction: Secondary | ICD-10-CM | POA: Diagnosis not present

## 2014-07-25 DIAGNOSIS — N179 Acute kidney failure, unspecified: Secondary | ICD-10-CM | POA: Diagnosis not present

## 2014-07-25 DIAGNOSIS — I213 ST elevation (STEMI) myocardial infarction of unspecified site: Secondary | ICD-10-CM | POA: Diagnosis not present

## 2014-07-25 DIAGNOSIS — E440 Moderate protein-calorie malnutrition: Secondary | ICD-10-CM | POA: Diagnosis not present

## 2014-07-25 DIAGNOSIS — Z01810 Encounter for preprocedural cardiovascular examination: Secondary | ICD-10-CM | POA: Diagnosis not present

## 2014-07-25 DIAGNOSIS — J90 Pleural effusion, not elsewhere classified: Secondary | ICD-10-CM | POA: Diagnosis not present

## 2014-07-25 DIAGNOSIS — R079 Chest pain, unspecified: Secondary | ICD-10-CM | POA: Diagnosis not present

## 2014-07-25 DIAGNOSIS — R140 Abdominal distension (gaseous): Secondary | ICD-10-CM | POA: Diagnosis not present

## 2014-07-25 DIAGNOSIS — N471 Phimosis: Secondary | ICD-10-CM | POA: Diagnosis not present

## 2014-07-25 DIAGNOSIS — I5022 Chronic systolic (congestive) heart failure: Secondary | ICD-10-CM | POA: Diagnosis not present

## 2014-07-25 DIAGNOSIS — K6389 Other specified diseases of intestine: Secondary | ICD-10-CM | POA: Diagnosis not present

## 2014-07-25 DIAGNOSIS — R531 Weakness: Secondary | ICD-10-CM | POA: Diagnosis not present

## 2014-07-25 DIAGNOSIS — K567 Ileus, unspecified: Secondary | ICD-10-CM | POA: Diagnosis not present

## 2014-07-25 DIAGNOSIS — N289 Disorder of kidney and ureter, unspecified: Secondary | ICD-10-CM | POA: Diagnosis not present

## 2014-07-25 DIAGNOSIS — R918 Other nonspecific abnormal finding of lung field: Secondary | ICD-10-CM | POA: Diagnosis not present

## 2014-07-25 DIAGNOSIS — R000 Tachycardia, unspecified: Secondary | ICD-10-CM | POA: Diagnosis not present

## 2014-07-25 DIAGNOSIS — Z433 Encounter for attention to colostomy: Secondary | ICD-10-CM | POA: Diagnosis not present

## 2014-07-25 DIAGNOSIS — I714 Abdominal aortic aneurysm, without rupture: Secondary | ICD-10-CM | POA: Diagnosis not present

## 2014-07-25 DIAGNOSIS — Z483 Aftercare following surgery for neoplasm: Secondary | ICD-10-CM | POA: Diagnosis not present

## 2014-07-25 DIAGNOSIS — I482 Chronic atrial fibrillation: Secondary | ICD-10-CM | POA: Diagnosis not present

## 2014-07-25 DIAGNOSIS — C19 Malignant neoplasm of rectosigmoid junction: Secondary | ICD-10-CM | POA: Diagnosis not present

## 2014-07-25 DIAGNOSIS — Z933 Colostomy status: Secondary | ICD-10-CM | POA: Diagnosis not present

## 2014-07-25 DIAGNOSIS — R109 Unspecified abdominal pain: Secondary | ICD-10-CM | POA: Diagnosis not present

## 2014-07-25 DIAGNOSIS — J9811 Atelectasis: Secondary | ICD-10-CM | POA: Diagnosis not present

## 2014-07-25 DIAGNOSIS — E785 Hyperlipidemia, unspecified: Secondary | ICD-10-CM | POA: Diagnosis not present

## 2014-07-25 DIAGNOSIS — K598 Other specified functional intestinal disorders: Secondary | ICD-10-CM | POA: Diagnosis not present

## 2014-07-25 DIAGNOSIS — Z9889 Other specified postprocedural states: Secondary | ICD-10-CM | POA: Diagnosis not present

## 2014-07-25 DIAGNOSIS — R0602 Shortness of breath: Secondary | ICD-10-CM | POA: Diagnosis not present

## 2014-07-25 DIAGNOSIS — N170 Acute kidney failure with tubular necrosis: Secondary | ICD-10-CM | POA: Diagnosis not present

## 2014-08-01 DIAGNOSIS — Z433 Encounter for attention to colostomy: Secondary | ICD-10-CM | POA: Diagnosis not present

## 2014-08-01 DIAGNOSIS — Z66 Do not resuscitate: Secondary | ICD-10-CM | POA: Diagnosis not present

## 2014-08-01 DIAGNOSIS — E785 Hyperlipidemia, unspecified: Secondary | ICD-10-CM | POA: Diagnosis not present

## 2014-08-01 DIAGNOSIS — C19 Malignant neoplasm of rectosigmoid junction: Secondary | ICD-10-CM | POA: Diagnosis not present

## 2014-08-01 DIAGNOSIS — I4891 Unspecified atrial fibrillation: Secondary | ICD-10-CM | POA: Diagnosis not present

## 2014-08-01 DIAGNOSIS — J449 Chronic obstructive pulmonary disease, unspecified: Secondary | ICD-10-CM | POA: Diagnosis not present

## 2014-08-01 DIAGNOSIS — Z515 Encounter for palliative care: Secondary | ICD-10-CM | POA: Diagnosis not present

## 2014-08-01 DIAGNOSIS — Z9981 Dependence on supplemental oxygen: Secondary | ICD-10-CM | POA: Diagnosis not present

## 2014-08-01 DIAGNOSIS — K567 Ileus, unspecified: Secondary | ICD-10-CM | POA: Diagnosis not present

## 2014-08-08 LAB — BASIC METABOLIC PANEL
ANION GAP: 3 — AB (ref 7–16)
BUN: 19 mg/dL
CALCIUM: 7.7 mg/dL — AB
CREATININE: 0.82 mg/dL
Chloride: 102 mmol/L
Co2: 36 mmol/L — ABNORMAL HIGH
EGFR (Non-African Amer.): >60
Glucose: 104 mg/dL — ABNORMAL HIGH
Potassium: 3.5 mmol/L
SODIUM: 141 mmol/L

## 2014-08-08 LAB — MAGNESIUM: MAGNESIUM: 2.1 mg/dL

## 2014-08-08 LAB — PLATELET COUNT: Platelet: 306 10*3/uL (ref 150–440)

## 2014-08-08 LAB — PHOSPHORUS: Phosphorus: 2.6 mg/dL

## 2014-08-09 LAB — CLOSTRIDIUM DIFFICILE(ARMC)

## 2014-08-09 LAB — BASIC METABOLIC PANEL
ANION GAP: 3 — AB (ref 7–16)
BUN: 18 mg/dL
Calcium, Total: 7.8 mg/dL — ABNORMAL LOW
Chloride: 101 mmol/L
Co2: 36 mmol/L — ABNORMAL HIGH
Creatinine: 0.77 mg/dL
EGFR (African American): >60
GLUCOSE: 138 mg/dL — AB
Potassium: 3.5 mmol/L
Sodium: 140 mmol/L

## 2014-08-09 LAB — PHOSPHORUS: Phosphorus: 2.5 mg/dL

## 2014-08-09 LAB — MAGNESIUM: Magnesium: 2.1 mg/dL

## 2014-08-10 LAB — BASIC METABOLIC PANEL
ANION GAP: 5 — AB (ref 7–16)
BUN: 19 mg/dL
Calcium, Total: 7.9 mg/dL — ABNORMAL LOW
Chloride: 101 mmol/L
Co2: 37 mmol/L — ABNORMAL HIGH
Creatinine: 0.8 mg/dL
EGFR (Non-African Amer.): >60
GLUCOSE: 125 mg/dL — AB
POTASSIUM: 3.8 mmol/L
Sodium: 143 mmol/L

## 2014-08-10 LAB — PHOSPHORUS: PHOSPHORUS: 2.5 mg/dL

## 2014-08-10 LAB — MAGNESIUM: Magnesium: 2.2 mg/dL

## 2014-08-10 LAB — PLATELET COUNT: PLATELETS: 272 10*3/uL (ref 150–440)

## 2014-08-12 LAB — BASIC METABOLIC PANEL
Anion Gap: 4 — ABNORMAL LOW (ref 7–16)
BUN: 18 mg/dL
CHLORIDE: 101 mmol/L
CO2: 38 mmol/L — AB
Calcium, Total: 8 mg/dL — ABNORMAL LOW
Creatinine: 0.82 mg/dL
EGFR (African American): >60
Glucose: 107 mg/dL — ABNORMAL HIGH
Potassium: 4.2 mmol/L
Sodium: 143 mmol/L

## 2014-08-13 DIAGNOSIS — C189 Malignant neoplasm of colon, unspecified: Secondary | ICD-10-CM | POA: Diagnosis not present

## 2014-08-13 DIAGNOSIS — E789 Disorder of lipoprotein metabolism, unspecified: Secondary | ICD-10-CM | POA: Diagnosis not present

## 2014-08-13 DIAGNOSIS — J449 Chronic obstructive pulmonary disease, unspecified: Secondary | ICD-10-CM | POA: Diagnosis not present

## 2014-08-13 DIAGNOSIS — L03311 Cellulitis of abdominal wall: Secondary | ICD-10-CM | POA: Diagnosis not present

## 2014-08-13 DIAGNOSIS — Z483 Aftercare following surgery for neoplasm: Secondary | ICD-10-CM | POA: Diagnosis not present

## 2014-08-13 DIAGNOSIS — N400 Enlarged prostate without lower urinary tract symptoms: Secondary | ICD-10-CM | POA: Diagnosis not present

## 2014-08-13 DIAGNOSIS — M47815 Spondylosis without myelopathy or radiculopathy, thoracolumbar region: Secondary | ICD-10-CM | POA: Diagnosis not present

## 2014-08-13 DIAGNOSIS — N471 Phimosis: Secondary | ICD-10-CM | POA: Diagnosis not present

## 2014-08-13 DIAGNOSIS — K567 Ileus, unspecified: Secondary | ICD-10-CM | POA: Diagnosis not present

## 2014-08-13 DIAGNOSIS — I2510 Atherosclerotic heart disease of native coronary artery without angina pectoris: Secondary | ICD-10-CM | POA: Diagnosis not present

## 2014-08-13 DIAGNOSIS — I482 Chronic atrial fibrillation: Secondary | ICD-10-CM | POA: Diagnosis not present

## 2014-08-13 DIAGNOSIS — I5022 Chronic systolic (congestive) heart failure: Secondary | ICD-10-CM | POA: Diagnosis not present

## 2014-08-13 DIAGNOSIS — I480 Paroxysmal atrial fibrillation: Secondary | ICD-10-CM | POA: Diagnosis not present

## 2014-08-13 DIAGNOSIS — K219 Gastro-esophageal reflux disease without esophagitis: Secondary | ICD-10-CM | POA: Diagnosis not present

## 2014-08-13 DIAGNOSIS — R531 Weakness: Secondary | ICD-10-CM | POA: Diagnosis not present

## 2014-08-13 DIAGNOSIS — I110 Hypertensive heart disease with heart failure: Secondary | ICD-10-CM | POA: Diagnosis not present

## 2014-08-13 DIAGNOSIS — I213 ST elevation (STEMI) myocardial infarction of unspecified site: Secondary | ICD-10-CM | POA: Diagnosis not present

## 2014-08-16 ENCOUNTER — Encounter
Admit: 2014-08-16 | Disposition: A | Discharge status: Home / Self Care | Payer: Self-pay | Attending: Internal Medicine | Admitting: Internal Medicine

## 2014-08-16 ENCOUNTER — Ambulatory Visit
Admit: 2014-08-16 | Disposition: A | Discharge status: Home / Self Care | Payer: Self-pay | Attending: Internal Medicine | Admitting: Internal Medicine

## 2014-08-16 LAB — CBC WITH DIFFERENTIAL/PLATELET
BANDS NEUTROPHIL: 3 %
HCT: 30.2 % — ABNORMAL LOW (ref 40.0–52.0)
HGB: 9.4 g/dL — AB (ref 13.0–18.0)
LYMPHS PCT: 9 %
MCH: 29.6 pg (ref 26.0–34.0)
MCHC: 31.1 g/dL — ABNORMAL LOW (ref 32.0–36.0)
MCV: 95 fL (ref 80–100)
Metamyelocyte: 2 %
Monocytes: 8 %
Myelocyte: 2 %
Platelet: 89 10*3/uL — ABNORMAL LOW (ref 150–440)
RBC: 3.18 10*6/uL — ABNORMAL LOW (ref 4.40–5.90)
RDW: 14.6 % — ABNORMAL HIGH (ref 11.5–14.5)
SEGMENTED NEUTROPHILS: 76 %
WBC: 14.3 10*3/uL — ABNORMAL HIGH (ref 3.8–10.6)

## 2014-08-20 DIAGNOSIS — L03311 Cellulitis of abdominal wall: Secondary | ICD-10-CM | POA: Diagnosis not present

## 2014-08-27 DIAGNOSIS — I110 Hypertensive heart disease with heart failure: Secondary | ICD-10-CM | POA: Diagnosis not present

## 2014-08-27 DIAGNOSIS — N400 Enlarged prostate without lower urinary tract symptoms: Secondary | ICD-10-CM | POA: Diagnosis not present

## 2014-08-27 DIAGNOSIS — E789 Disorder of lipoprotein metabolism, unspecified: Secondary | ICD-10-CM | POA: Diagnosis not present

## 2014-08-27 DIAGNOSIS — I5022 Chronic systolic (congestive) heart failure: Secondary | ICD-10-CM | POA: Diagnosis not present

## 2014-08-27 DIAGNOSIS — I2510 Atherosclerotic heart disease of native coronary artery without angina pectoris: Secondary | ICD-10-CM | POA: Diagnosis not present

## 2014-08-27 DIAGNOSIS — K219 Gastro-esophageal reflux disease without esophagitis: Secondary | ICD-10-CM | POA: Diagnosis not present

## 2014-08-27 DIAGNOSIS — I480 Paroxysmal atrial fibrillation: Secondary | ICD-10-CM | POA: Diagnosis not present

## 2014-08-27 DIAGNOSIS — J449 Chronic obstructive pulmonary disease, unspecified: Secondary | ICD-10-CM | POA: Diagnosis not present

## 2014-08-27 DIAGNOSIS — M47815 Spondylosis without myelopathy or radiculopathy, thoracolumbar region: Secondary | ICD-10-CM | POA: Diagnosis not present

## 2014-08-29 DIAGNOSIS — I2510 Atherosclerotic heart disease of native coronary artery without angina pectoris: Secondary | ICD-10-CM | POA: Diagnosis not present

## 2014-08-29 DIAGNOSIS — J449 Chronic obstructive pulmonary disease, unspecified: Secondary | ICD-10-CM | POA: Diagnosis not present

## 2014-08-29 DIAGNOSIS — E782 Mixed hyperlipidemia: Secondary | ICD-10-CM | POA: Diagnosis not present

## 2014-09-06 ENCOUNTER — Emergency Department
Admit: 2014-09-06 | Disposition: A | Discharge status: Home / Self Care | Payer: Self-pay | Admitting: Emergency Medicine

## 2014-09-06 DIAGNOSIS — Z91040 Latex allergy status: Secondary | ICD-10-CM | POA: Diagnosis not present

## 2014-09-06 DIAGNOSIS — S40021A Contusion of right upper arm, initial encounter: Secondary | ICD-10-CM | POA: Diagnosis not present

## 2014-09-06 DIAGNOSIS — S5011XA Contusion of right forearm, initial encounter: Secondary | ICD-10-CM | POA: Diagnosis not present

## 2014-09-06 DIAGNOSIS — Z23 Encounter for immunization: Secondary | ICD-10-CM | POA: Diagnosis not present

## 2014-09-07 NOTE — Consult Note (Signed)
Chief Complaint:  Subjective/Chief Complaint Covering for Dr. Vira Agar. S/P blood transfusion yesterday. Hgb upto 8.4 today. No specific complaints. No Bm yet. EGD negative.   VITAL SIGNS/ANCILLARY NOTES: **Vital Signs.:   27-Dec-15 06:24  Vital Signs Type Routine  Temperature Temperature (F) 98.2  Celsius 36.7  Temperature Source oral  Pulse Pulse 72  Respirations Respirations 20  Systolic BP Systolic BP 888  Diastolic BP (mmHg) Diastolic BP (mmHg) 63  Mean BP 79  Pulse Ox % Pulse Ox % 95  Pulse Ox Activity Level  At rest  Oxygen Delivery 2L; Nasal Cannula   Brief Assessment:  GEN no acute distress   Cardiac Regular   Respiratory clear BS   Gastrointestinal Normal   Lab Results: Routine Chem:  27-Dec-15 05:01   Result Comment HGB/HCT - RESULTS VERIFIED BY REPEAT TESTING.  Result(s) reported on 12 May 2014 at 05:41AM.  Routine Hem:  27-Dec-15 05:01   WBC (CBC) 10.6  RBC (CBC)  4.33  Hemoglobin (CBC)  8.4  Hematocrit (CBC)  28.6  Platelet Count (CBC) 165  MCV  66  MCH  19.4  MCHC  29.3  RDW  32.2  Neutrophil % 69.0  Lymphocyte % 13.4  Monocyte % 13.2  Eosinophil % 3.3  Basophil % 1.1  Neutrophil #  7.3  Lymphocyte # 1.4  Monocyte #  1.4  Eosinophil # 0.3  Basophil # 0.1   Assessment/Plan:  Assessment/Plan:  Assessment Fe def anemia. Neg EGD. Stable.   Plan For colonoscopy tomorrow with Dr. Vira Agar.   Electronic Signatures: Verdie Shire (MD)  (Signed 27-Dec-15 10:24)  Authored: Chief Complaint, VITAL SIGNS/ANCILLARY NOTES, Brief Assessment, Lab Results, Assessment/Plan   Last Updated: 27-Dec-15 10:24 by Verdie Shire (MD)

## 2014-09-07 NOTE — Consult Note (Signed)
Pt with severe iron def anemia with hypochromic microcytic anemia.  Plan check blood in morning and do EGD tomorrow morning and depending on that test will do colonoscopy afterwards.  Given his heme pos stool and severe iron def I worry about occult right colon neoplasm or chronic blood loss from signif AVM.  Electronic Signatures: Manya Silvas (MD)  (Signed on 25-Dec-15 20:34)  Authored  Last Updated: 25-Dec-15 20:34 by Manya Silvas (MD)

## 2014-09-07 NOTE — Consult Note (Signed)
Surgeon requests I mark the area of the sigmoid colon so he can get this area also at time of right colon resection.  Discussed with anesthesia and Endo supervisor and patient family and will do this today.  Electronic Signatures: Manya Silvas (MD)  (Signed on 29-Dec-15 13:35)  Authored  Last Updated: 29-Dec-15 13:35 by Manya Silvas (MD)

## 2014-09-07 NOTE — H&P (Signed)
PATIENT NAME:  Douglas Ramsey, Douglas Ramsey MR#:  [id number] DATE OF BIRTH:  Mar 24, 1919  DATE OF ADMISSION:  05/09/2014  PRIMARY CARE PHYSICIAN:  Idelle Crouch, MD   EMERGENCY ROOM PHYSICIAN:  Garnette Scheuermann, MD    CHIEF COMPLAINT:  Anemia.  HISTORY OF PRESENT ILLNESS:  This is a 81 year old male sent in from Dr. Alveria Apley office because of hemoglobin of 5. The patient denies any melena. No history of vomiting of blood. No abdominal pain and no recent loss of weight. The patient's main complaint is shortness of breath and fatigue. The patient went to see Dr. Raul Del where he was examined and he was referred to Dr. Nehemiah Massed. Dr. Nehemiah Massed took the blood work and found he was profoundly anemic so he was sent in here.  The patient denies any other complaints. No chest pain. The patient has no abdominal pain.   PAST MEDICAL HISTORY:  Coronary artery disease status post stent in 2008, history of COPD on 2 liters of oxygen.  Also includes BPH, hyperlipidemia, mild renal insufficiency, GERD.   ALLERGIES: He has no known allergies.   SOCIAL HISTORY: No smoking, no drinking.  Lives with wife.    PAST SURGICAL HISTORY:  Coronary artery disease and a stent placement.  Also includes cataract surgery, stent placement in the RCA.  FAMILY HISTORY: No family history of hypertension or diabetes. The patient has 1 living sister 34 year old.   REVIEW OF SYSTEMS:   GENERAL: The  patient denies any fever. Does have some fatigue.  EYES: No blurred vision.  EARS AND NOSE AND THROAT: No tinnitus. No epistaxis. No difficulty swallowing. RESPIRATORY:   No cough. No wheezing. Does have some trouble breathing with exertion.  CARDIOVASCULAR: No chest pain. No orthopnea.  GASTROINTESTINAL: No nausea. No vomiting. No abdominal pain.  GENITOURINARY: No dysuria.  ENDOCRINE: No polyphagia, polydipsia.  HEMATOLOGIC: No anemia or easy bruising.  MUSCULOSKELETAL: No joint pain.  NEUROLOGIC: No numbness or weakness.  PSYCHIATRIC:   No anxiety or insomnia.   MEDICATIONS: Advair Diskus 250/50 one puff b.i.d., aspirin 81 mg daily, Combivent 18-103 mcg 1 puff b.i.d., simvastatin 20 mg p.o. daily, Spiriva 18 mcg inhalation daily, nitroglycerin as needed for chest pain 0.4 mg sublingual.   PHYSICAL EXAMINATION:  VITAL SIGNS: Temperature 97.8, heart rate 83, blood pressure is about 124/57. The patient's saturations were 98% on room air.  GENERAL: Alert, awake, oriented, elderly male not in distress, very hard of hearing. The patient is  responding appropriately. HEAD: Normocephalic, atraumatic.  EYES: Pupils are equal, reacting to light. Conjunctivae pallor is present.  NOSE: No  (\Turbinate hypertrophya,no drainage.  MOUTH: no oropharyngeal erythema .   NECK:  Supple, no JVD, no carotid bruits.   RESPIRATORY: The patient has good respiratory effort . No wheezing, no rales.  CARDIOVASCULAR: S1, S2 regular. No murmurs. The patient's PMI not displaced. The patient's pulses are equal in carotid, femoral, and dorsalis pedis, no peripheral edema.  GASTROINTESTINAL:  Abdomen is soft, nontender, nondistended.   EXTREMITIES:  No extremity edema.  NEUROLOGIC: Musculoskeletal power 5/5 in upper and lower extremities and able to move all extremities.  SKIN: Inspection is normal.  NEUROLOGIC:  Alert, awake, oriented. Cranial nerves II through XII are intact;  power 5/5 in upper and lower extremities; sensation is intact , DTRs 2+ bilaterally.  PSYCHIATRIC: Mood and affect are within normal limits.   LABORATORY DATA: WBC 5.3, hemoglobin 5.4, hematocrit 20.3, platelets 194,000, electrolytes, sodium is 141, potassium 4.2, chloride 106, bicarbonate 27, BUN  18, creatinine 1.2 with a glucose of 84.   ASSESSMENT AND PLAN:  The patient is a 81 year old male with severe symptomatic anemia(SOB,Fatigue) hemoglobin 5.4, hematocrit of 20.3.   1.  Admit to hospitalist service.  Transfuse 2 units (PRBC,check  CBC  post trasnfusion  obtain anemia panel  and stool for guaiac and gastrointestinal consultations and the patient denies any complaints, want to go home for Christmas.  That is a possible option. Dr. Doy Hutching can discharge and have a GI follow-up as an outpatient if the patient is stable.  2.  Coronary artery disease, status post stenting, hold the aspirin at this time, however, it is possible in this setting, just gastritis.  3.  Chronic obstructive pulmonary disease, oxygen dependent. Continue Spiriva, Combivent and Advair discus.    TIME SPENT: More than 55 minutes.    ____________________________ Epifanio Lesches, MD sk:at D: 05/09/2014 14:41:44 ET T: 05/09/2014 15:35:30 ET JOB#: [id number]  cc: Epifanio Lesches, MD, <Dictator> Leonie Douglas. Doy Hutching, MD  Epifanio Lesches MD ELECTRONICALLY SIGNED 05/09/2014 20:47

## 2014-09-07 NOTE — Consult Note (Signed)
General Aspect The patient is a 81 year old male who was admitted on December 24, with fatique and a hemoglobin of 5. The colonoscopy showed a large fungating mass in the right colon which was biopsied and sent for pathology. Also a 30 mm polyp was found in the distal sigmoid colon which also was biopsied, pathology results are pending.  The patient otherwise has been doing fine; no fevers, chills, night sweats, shortness of breath, cough, pain, abdominal pain, nausea, vomiting, diarrhea, constipation, dysuria, or hematuria. CT scan of the abdomen and the chest shows a 5.4 cm infrarenal AAA.  He denies caludication of pain in the feet no blue toe symptoms.  No back or abdominal pain.  PAST MEDICAL HISTORY: Coronary artery disease status post stent in 2005, history of COPD, BPH, hyperlipidemia, renal insufficiency, GERD, history of cataract surgery.   Home Medications: Medication Instructions Status  Nitrostat 0.4 mg sublingual tablet  1 tab (0.4 mg) sublingual every 5 minutes times 3 doses as needed-if no relief call md or go to emergency room.   Active  simvastatin 20 mg oral tablet 1 tab(s) orally once a day (at bedtime) Active  Advair Diskus 250 mcg-50 mcg inhalation powder 1 puff(s) inhaled 2 times a day to relieve wheezing. Active  Astepro 205.5 mcg/inh (0.15%) nasal spray 1 spray(s) in each nostril once a day. Active  Spiriva 18 mcg inhalation capsule 1 each inhaled once a day Active  Combivent 18 mcg-103 mcg-/inh inhalation aerosol 2 puff(s) inhaled 4 times a day while awake Active    No Known Allergies:   Case History:  Family History Non-Contributory   Social History negative tobacco, negative ETOH, negative Illicit drugs   Review of Systems:  Fever/Chills No   Cough No   Sputum No   Abdominal Pain No   Diarrhea No   Constipation No   Nausea/Vomiting No   SOB/DOE No   Chest Pain No   Telemetry Reviewed NSR   Dysuria No   Physical Exam:  GEN well developed,  well nourished, no acute distress   HEENT pale conjunctivae, PERRL, hearing intact to voice   NECK supple  trachea midline   RESP normal resp effort  no use of accessory muscles   CARD regular rate  No LE edema  no JVD   ABD denies tenderness  soft  adominal Mass   EXTR negative cyanosis/clubbing, negative edema, enlarged popliteal pulsations; 2+ DP pulses bilaterally   SKIN normal to palpation, No ulcers   NEURO cranial nerves intact, follows commands, motor/sensory function intact   PSYCH alert, good insight   Nursing/Ancillary Notes: **Vital Signs.:   29-Dec-15 13:29  Vital Signs Type Routine  Temperature Temperature (F) 97.5  Celsius 36.3  Temperature Source oral  Pulse Pulse 81  Respirations Respirations 18  Systolic BP Systolic BP 401  Diastolic BP (mmHg) Diastolic BP (mmHg) 74  Mean BP 92  Pulse Ox % Pulse Ox % 93  Pulse Ox Activity Level  At rest  Oxygen Delivery Room Air/ 21 %   Hepatic:  29-Dec-15 04:28   Bilirubin, Total 0.9  Bilirubin, Direct 0.2 (Result(s) reported on 14 May 2014 at 05:32AM.)  Alkaline Phosphatase 60 (46-116 NOTE: New Reference Range 12/04/13)  SGPT (ALT) 16 (14-63 NOTE: New Reference Range 12/04/13)  SGOT (AST) 24  Total Protein, Serum  6.2  Albumin, Serum  2.9  Routine Chem:  29-Dec-15 04:28   Glucose, Serum 79  BUN 7  Creatinine (comp) 1.17  Sodium, Serum 142  Potassium, Serum 3.6  Chloride, Serum  109  CO2, Serum 26  Calcium (Total), Serum  8.2  Anion Gap 7  Osmolality (calc) 280  eGFR (African American) >60  eGFR (Non-African American) >60 (eGFR values <44m/min/1.73 m2 may be an indication of chronic kidney disease (CKD). Calculated eGFR, using the MRDR Study equation, is useful in  patients with stable renal function. The eGFR calculation will not be reliable in acutely ill patients when serum creatinine is changing rapidly. It is not useful in patients on dialysis. The eGFR calculation may not be applicable to  patients at the low and high extremes of body sizes, pregnant women, and vegetarians.)  Result Comment HGB/HCT - RESULTS VERIFIED BY REPEAT TESTING.  Result(s) reported on 14 May 2014 at 05:31AM.  Routine Hem:  29-Dec-15 04:28   WBC (CBC) 8.7  RBC (CBC)  4.25  Hemoglobin (CBC)  8.2  Hematocrit (CBC)  28.2  Platelet Count (CBC)  149  MCV  66  MCH  19.4  MCHC  29.2  RDW  33.8  Neutrophil % 69.4  Lymphocyte % 11.7  Monocyte % 16.7  Eosinophil % 1.2  Basophil % 1.0  Neutrophil # 6.0  Lymphocyte # 1.0  Monocyte #  1.5  Eosinophil # 0.1  Basophil # 0.1   CT:    29-Dec-15 09:05, CT Chest, Abd, and Pelvis With Contrast  CT Chest, Abd, and Pelvis With Contrast   REASON FOR EXAM:    (1) colon cancer; (2) colon cancer  COMMENTS:       PROCEDURE: CT  - CT CHEST ABDOMEN AND PELVIS W  - May 14 2014  9:05AM     CLINICAL DATA:  Newly diagnosed colon carcinoma by colonoscopy.  Prior deficiency anemia. GI bleeding.    EXAM:  CT CHEST, ABDOMEN, AND PELVIS WITH CONTRAST    TECHNIQUE:  Multidetector CT imaging of the chest, abdomen and pelvis was  performed following the standard protocol during bolus  administration of intravenous contrast.  CONTRAST:  100 mL Omnipaque 300    COMPARISON:  Chest CT on 04/19/2014 ; no prior abdomen pelvis CT    FINDINGS:  CT CHEST FINDINGS    Mediastinum/Hilar Regions: 10 mm right paratracheal lymph node  remains stable as well as other shotty less than 1 cm mediastinal  lymph nodes.    Other Thoracic Lymphadenopathy:  None.    Lungs: Mild to moderate emphysema again noted. No suspicious  pulmonary nodules or masses identified.  Pleura: Bilateral calcified pleural plaque and mild pleural  thickening again noted, consistent with asbestos related pleural  disease. No pleural or chest wall mass identified.    Vascular/Cardiac:  No acute findings identified.    Other:  None.    Musculoskeletal:  No suspicious bone lesions identified.    CT  ABDOMEN AND PELVIS FINDINGS    Hepatobiliary: Several small hepatic cysts noted, however no liver  masses are identified. Gallbladder is unremarkable.  Pancreas: No mass, inflammatory changes, or other parenchymal  abnormality identified.    Spleen:  Within normal limits in size and appearance.    Adrenal Glands:  No mass identified.    Kidneys/Urinary Tract: No masses identified. Bilateral renal cysts  noted. No evidence of hydronephrosis.    Stomach/Bowel/Peritoneum: Masslike area of irregular wall thickening  is seen involving the ascending colon just above the level of the  ileocecal valve, suspicious for primary colon carcinoma. No evidence  of bowel obstruction.  Vascular/Lymphatic: Mild lymphadenopathy seen in the adjacent  right  lower quadrant mesentery with largest lymph node measuring 10 mm on  image 59 of series 6. This is suspicious for lymph node metastases.  No other sites of lymphadenopathy identified within the abdomen or  pelvis.    Infrarenal abdominal aortic aneurysm is seen measuring 5.4 cm in  maximum diameter. No evidence of aneurysm leak or rupture.    Reproductive:  No mass or other significant abnormality identified.    Other: Small right inguinal hernia is seen containing small bowel.  No evidence of bowel obstruction or ischemia.    Musculoskeletal:  No suspicious bone lesions identified.   IMPRESSION:  Masslike wall thickening involving the proximal ascending colon,  likely corresponding to the site of primary colon carcinoma seen at  colonoscopy.    Mild right lower quadrant pericolonic/mesenteric lymphadenopathy  measuring up to 10 mm, suspicious for metastatic disease.    No evidence of distant metastatic disease.    5.4 cm infrarenal abdominal aortic aneurysm. Recommend followup by  abdomen and pelvis CTA in 3-6 months, and vascular surgery  referral/consultation if not already obtained. This recommendation  follows ACR consensus  guidelines: White Paper of the ACR Incidental  Findings Committee II on Vascular Findings. J Am Coll Radiol 2013;  10:789-794.    Small right inguinalhernia containing small bowel.    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      Electronically Signed    By: Earle Gell M.D.    On: 05/14/2014 09:50         Verified By: Marlaine Hind, M.D.,    Impression 1.  Large AAA it is infrarenal and a very good candidate for endovascular stenting.  Given his presenting symptoms and the likely colon CA this should be treated first.  He would likely do poorly with heprinization and his risk of ruture per month is about 1.5%.  Once his resection is complete and he has proven he will do OK arrangements will be made for stent grafting. 2  Colon Mass/colon CA  work up continues path pending. 3  Iron deficiency anemia, probably chronic blood loss anemia,  Colonoscopy showed large mass. Hgb has been stable s/p Tx 4 COPD- stable with current medications 5. HTN- off meds with BP borderline low, but asymptomatic 6. CAD- off all anticoagulation/BP and HR meds; no new symptoms no chest pain   Plan consult level 4   Electronic Signatures: Hortencia Pilar (MD)  (Signed 29-Dec-15 20:49)  Authored: General Aspect/Present Illness, Home Medications, Allergies, History and Physical Exam, Vital Signs, Labs, Radiology, Impression/Plan   Last Updated: 29-Dec-15 20:49 by Hortencia Pilar (MD)

## 2014-09-07 NOTE — Consult Note (Signed)
Pt had EGD by me which was completely normal. His BP fell right after the procedure but came back up with elevating his legs.   Will let him rest today and do prep tomorrow and get colonoscopy Monday by me.  Discussed with his wife.  Electronic Signatures: Manya Silvas (MD)  (Signed on 26-Dec-15 12:13)  Authored  Last Updated: 26-Dec-15 12:13 by Manya Silvas (MD)

## 2014-09-07 NOTE — Consult Note (Signed)
Patient with right colon cancer,needs right colon resection and maybe left as well.  Await biopsies, get CT scan of abd, clear liq diet, Surgical consult to Dr. Rexene Edison.  Discussed with Dr. Caryl Comes, and family.  Electronic Signatures: Manya Silvas (MD)  (Signed on 28-Dec-15 13:45)  Authored  Last Updated: 28-Dec-15 13:45 by Manya Silvas (MD)

## 2014-09-07 NOTE — Consult Note (Signed)
PATIENT NAME:  Douglas Ramsey, Douglas Ramsey MR#:  [id number] DATE OF BIRTH:  Dec 24, 1918  DATE OF CONSULTATION:  05/10/2014  REFERRING PHYSICIAN:   CONSULTING PHYSICIAN:  Manya Silvas, MD  HISTORY OF PRESENT ILLNESS:  The patient is a 81 year old white male who was seen by Dr. Nehemiah Massed and Dr. Raul Del, and he was found to have a hemoglobin of 5, and was admitted to the hospital. He has gotten 2 units of blood since then. He feels somewhat stronger and is breathing better. I was asked to see him for consultation.    PAST MEDICAL HISTORY: Includes coronary artery disease with a stent 2008, history of COPD, recently on 2 liters of oxygen, history of BPH, hyperlipidemia, mild renal insufficiency, and GERD. He has been wheezing in the past couple of weeks on oxygen.   ALLERGIES: No known drug allergies.   SOCIAL HISTORY: Does not smoke, does not drink, lives with his wife.   PAST SURGICAL HISTORY: Coronary artery disease with stent placement, cataract surgery.   FAMILY HISTORY: Patient has a  living sister 69 years old.   REVIEW OF SYSTEMS: Denies any chest pain. No nausea or vomiting. No cough. No phlegm. No irregular heartbeats. No melena. No bright red blood per rectum. He has had dyspnea on exertion over the last few weeks and increasing breathlessness over the last six weeks; especially bad over the last week.   PHYSICAL EXAMINATION: GENERAL: An elderly white male who looks younger than stated age, good historian, fully with it.  VITAL SIGNS: Temperature 98.3, pulse 85, blood pressure 132/70, pulse oximetry 98% on 2 liters.  HEENT: Sclerae anicteric, conjunctivae slightly pale, tongue slightly pale.  CHEST: Clear. There is slightly decreased air flow at the left base.  HEART: No murmurs or gallops I can hear.  ABDOMEN: Bowel sounds present, no hepatosplenomegaly, no masses; no bruits.  EXTREMITIES: No significant edema.   LABORATORY DATA: CBC this morning shows a hemoglobin of 6.2, platelet  count 149,000, MCV is 61, MCH of 17.5, iron serum is 16, with 4% iron saturation, ferritin is 3.   Since his last labs, he has had another unit of blood.   ASSESSMENT: Chronic iron deficiency anemia with severe hypochromic microcytic indices without obvious brisk gastrointestinal bleeding. He has never had a colonoscopy or upper endoscopy. There is a significant possibility that he has a bleeding source in his gastrointestinal tract, possibly neoplasm of the right colon and possibly other neoplasms elsewhere, there is also a possibility of chronic blood loss from arteriovenous malformations or asymptomatic ulcer disease.   PLAN: Start off with an upper endoscopy tomorrow, if that is negative proceed with a colonoscopy, would recommend to keep his hemoglobin around 8 because of his breathlessness, wheezing and respiratory problems at the current level.    ____________________________ Manya Silvas, MD rte:nt D: 05/10/2014 20:32:55 ET T: 05/10/2014 21:02:59 ET JOB#: [id number]  cc: Manya Silvas, MD, <Dictator> Leonie Douglas. Doy Hutching, MD [facility]. Jacqualine Code, MD  Manya Silvas MD ELECTRONICALLY SIGNED 05/15/2014 14:01

## 2014-09-07 NOTE — Consult Note (Signed)
Pt tolerated flex sig well.  Ink marks place per op note.  Needs resection of  sigmoid to the rectosigmoid area to be sure get it all.  Electronic Signatures: Manya Silvas (MD)  (Signed on 29-Dec-15 15:54)  Authored  Last Updated: 29-Dec-15 15:54 by Manya Silvas (MD)

## 2014-09-09 DIAGNOSIS — I2510 Atherosclerotic heart disease of native coronary artery without angina pectoris: Secondary | ICD-10-CM | POA: Diagnosis not present

## 2014-09-09 DIAGNOSIS — I340 Nonrheumatic mitral (valve) insufficiency: Secondary | ICD-10-CM | POA: Insufficient documentation

## 2014-09-09 DIAGNOSIS — R0602 Shortness of breath: Secondary | ICD-10-CM | POA: Diagnosis not present

## 2014-09-09 DIAGNOSIS — E782 Mixed hyperlipidemia: Secondary | ICD-10-CM | POA: Diagnosis not present

## 2014-09-09 DIAGNOSIS — I493 Ventricular premature depolarization: Secondary | ICD-10-CM | POA: Diagnosis not present

## 2014-09-09 LAB — SURGICAL PATHOLOGY

## 2014-09-11 NOTE — Consult Note (Signed)
PATIENT NAME:  Douglas Ramsey, Douglas Ramsey MR#:  [id number] DATE OF BIRTH:  05/12/1919  DATE OF CONSULTATION:  05/13/2014  REFERRING PHYSICIAN:   CONSULTING PHYSICIAN:  Douglas Gave A. Grae Leathers, MD  REASON FOR CONSULTATION: Right colon fungating mass and unresectable left sigmoid colon polyp.   HISTORY OF PRESENT ILLNESS: [name] is a pleasant 81 year old male who was admitted on December 24, for a hemoglobin of 5. He had an upper GI and a lower GI, a colonoscopy. The colonoscopy was performed today which showed a large fungating mass in the right colon which was biopsied and sent for pathology. Also a 30 mm polyp was found in the distal sigmoid colon which also was biopsied, otherwise had been doing fine; no fevers, chills, night sweats, shortness of breath, cough, pain, abdominal pain, nausea, vomiting, diarrhea, constipation, dysuria, or hematuria.   PAST MEDICAL HISTORY: Coronary artery disease status post stent in 2005, history of COPD on 2 liters, BPH, hyperlipidemia, renal insufficiency, GERD, history of cataract surgery.   ALLERGIES: No known drug allergies.   SOCIAL HISTORY: Denies tobacco or alcohol use.   FAMILY HISTORY:  One living sister, who is a 68 year old, no history of hypertension, diabetes.   ALLERGIES: No known drug allergies.   HOME MEDICATIONS:  1.  Advair.  2.  Astepro.    3.  Combivent.  4.  Nitrostat.  5.  Rosuvastatin.   6.  Spiriva.   PHYSICAL EXAMINATION: VITAL SIGNS: Temperature 97.6, pulse 74, blood pressure 114/62, respirations 18, sats 96% on 2 liters.  GENERAL: No acute distress, alert and oriented x 3.  HEAD: Normocephalic, atraumatic.  EYES: No scleral icterus. No conjunctivitis.  FACE: No obvious face trauma, normal external nose, normal external ears.  LUNGS: Clear to auscultation, moving air well.  HEART: Regular rate and rhythm. No murmurs, rubs, or gallops.  ABDOMEN: Soft, nontender, nondistended.  EXTREMITIES: Moves all extremities well, strength  5/5.  NEUROLOGIC: Cranial nerves II through XII grossly intact.   LABORATORY: Currently hemoglobin of 8.7, hematocrit 29.7, platelets 172,000, white cell count 8.2.   IMAGING: No imaging has been performed with this admission; however, does have a CT of the chest without contrast, which was relatively nonspecific.   ASSESSMENT AND PLAN: Mr.  Pickrel is a pleasant 81 year old with 2 concerning lesions one likely a cancer according to Dr. Vira Agar, 1 potentially a sessile polyp, total pathology unknown. We will recommend work-up for colon cancer while pathology still is pending; will require surgical resection of one or both lesions, we will discuss with Dr. Vira Agar further.    ____________________________ Glena Norfolk. Landan Fedie, MD cal:nt D: 05/13/2014 17:24:05 ET T: 05/13/2014 18:00:00 ET JOB#: [id number]  cc: Douglas Gave A. Luva Metzger, MD, <Dictator> Floyde Parkins MD ELECTRONICALLY SIGNED 05/28/2014 19:53

## 2014-09-15 NOTE — Consult Note (Signed)
Note Type Consult   Subjective: Chief Complaint/Diagnosis:   Colon cancer. HPI:   Patient is a 81 year old male who presented to the emergency room with profound anemia. Subsequent workup included colonoscopy which revealed a large colon mass. Patient subsequently underwent resection on May 16, 2014. Final pathology is pending. Patient currently feels weak, but otherwise well. He has no neurologic complaints. He denies any fevers. He is a good appetite and denies weight loss. He denies any chest pain or shortness of breath. He denies any nausea, vomiting, constipation, or diarrhea. He has no melena or hematochezia. Patient otherwise feels well and offers no further specific complaints today.   Review of Systems:  Performance Status (ECOG): 0  Pain ?: No complaints (0, none)  Emotional well-being: None  Review of Systems:   As per HPI. Otherwise, 10 point system review was negative.   Allergies:  No Known Allergies:   PFSH: Additional Past Medical and Surgical History: GERD, CAD status post stent, cataract surgery, partial colectomy.    Social history: Patient denies tobacco and alcohol.    Family history: Negative and noncontributory.   Home Medications: Medication Instructions Last Modified Date/Time  Nitrostat 0.4 mg sublingual tablet  1 tab (0.4 mg) sublingual every 5 minutes times 3 doses as needed-if no relief call md or go to emergency room.   25-Dec-15 17:10  simvastatin 20 mg oral tablet 1 tab(s) orally once a day (at bedtime) 25-Dec-15 17:10  Advair Diskus 250 mcg-50 mcg inhalation powder 1 puff(s) inhaled 2 times a day to relieve wheezing. 25-Dec-15 17:10  Astepro 205.5 mcg/inh (0.15%) nasal spray 1 spray(s) in each nostril once a day. 25-Dec-15 17:10  Spiriva 18 mcg inhalation capsule 1 each inhaled once a day 25-Dec-15 17:10  Combivent 18 mcg-103 mcg-/inh inhalation aerosol 2 puff(s) inhaled 4 times a day while awake 25-Dec-15 17:10   Vital Signs:  :: vital  signs stable, patient afebrile.   Physical Exam:  General: well developed, well nourished, and in no acute distress  Mental Status: normal affect  Eyes: anicteric sclera  Head, Ears, Nose,Throat: Normocephalic, moist mucous membranes, clear oropharynx without erythema or thrush.  Neck, Thyroid: No palpable lymphadenopathy, thyroid midline without nodules.  Respiratory: clear to auscultation bilaterally  Cardiovascular: regular rate and rhythm, no murmur, rub, or gallop  Gastrointestinal: soft, nondistended, nontender, no organomegaly.  normal active bowel sounds, surgical scar noted.  Musculoskeletal: No edema  Skin: No rash or petechiae noted  Neurological: alert, answering all questions appropriately.  Cranial nerves grossly intact   Laboratory Results: Routine Chem:  03-Jan-16 06:08   Glucose, Serum 97  BUN 9  Creatinine (comp) 1.10  Sodium, Serum 137  Potassium, Serum 4.1  Chloride, Serum 103  CO2, Serum 29  Calcium (Total), Serum  7.7  Anion Gap  5  Osmolality (calc) 272  eGFR (African American) >60  eGFR (Non-African American) >60 (eGFR values <87m/min/1.73 m2 may be an indication of chronic kidney disease (CKD). Calculated eGFR, using the MRDR Study equation, is useful in  patients with stable renal function. The eGFR calculation will not be reliable in acutely ill patients when serum creatinine is changing rapidly. It is not useful in patients on dialysis. The eGFR calculation may not be applicable to patients at the low and high extremes of body sizes, pregnant women, and vegetarians.)  04-Jan-16 06:18   Ferritin ([facility]) 108 (Result(s) reported on 20 May 2014 at 01:48PM.)  Iron Binding Capacity (TIBC)  243  Unbound Iron Binding Capacity 222  Iron, Serum  21  Iron Saturation 9 (Result(s) reported on 20 May 2014 at 01:54PM.)  Routine Hem:  03-Jan-16 06:08   WBC (CBC) 9.5  RBC (CBC)  3.93  Hemoglobin (CBC)  8.4  Hematocrit (CBC)  28.1  Platelet Count (CBC)  132   MCV  71  MCH  21.5  MCHC  30.0  RDW  33.5  Neutrophil % 73.2  Lymphocyte % 8.7  Monocyte % 14.9  Eosinophil % 2.6  Basophil % 0.6  Neutrophil #  6.9  Lymphocyte #  0.8  Monocyte #  1.4  Eosinophil # 0.2  Basophil # 0.1 (Result(s) reported on 19 May 2014 at 06:52AM.)  04-Jan-16 06:18   WBC (CBC)  12.8  RBC (CBC)  4.18  Hemoglobin (CBC)  8.9  Hematocrit (CBC)  29.5  Platelet Count (CBC) 201  MCV  71  MCH  21.3  MCHC  30.2  RDW  33.8  Neutrophil % 77.4  Lymphocyte % 6.6  Monocyte % 12.7  Eosinophil % 2.7  Basophil % 0.6  Neutrophil #  9.9  Lymphocyte #  0.8  Monocyte #  1.6  Eosinophil # 0.3  Basophil # 0.1 (Result(s) reported on 20 May 2014 at 06:49AM.)   Medical Imaging Results:   Review Medical Imaging   Chest, Abd, and Pelvis With Contrast 14-May-2014 09:05:00: IMPRESSION:  Masslike wall thickening involving the proximal ascending colon,  likely corresponding to the site of primary colon carcinoma seen at  colonoscopy.    Mild right lower quadrant pericolonic/mesenteric lymphadenopathy  measuring up to 10 mm, suspicious for metastatic disease.    No evidence of distant metastatic disease.    5.4 cm infrarenal abdominal aortic aneurysm. Recommend followup by  abdomen and pelvis CTA in 3-6 months, and vascular surgery  referral/consultation if not already obtained. This recommendation  follows ACR consensus guidelines: White Paper of the ACR Incidental  Findings Committee II on Vascular Findings. J Am Coll Radiol 2013;  10:789-794.    Small right inguinalhernia containing small bowel.    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      Electronically Signed    By: Earle Gell M.D.    On: 05/14/2014 09:50         Verified By: Marlaine Hind, M.D.,  Assessment and Plan: Impression:   Colon cancer. Plan:   1. Colon cancer: CT results as above with no obvious metastatic disease. Preop CEA is 2.0. Final pathology and staging are pending. No intervention is needed  at this time. Patient likely will not require adjuvant chemotherapy given his advanced age. No further intervention is needed at this time. Patient has been instructed to follow-up in the [place] in approximately 1 to 2 weeks to discuss his final pathology results. He expressed understanding and was in agreement with this plan.Iron deficiency anemia: Likely secondary to blood loss from above stated colon cancer. Patient received 2 units of packed red blood cells during this admission with significant improvement of his hemoglobin. Iron stores as above and patient will benefit from 510 mg IV Feraheme. He can receive his second dose as an outpatient when he returns to the [facility]. consult, call with questions.  Electronic Signatures: Delight Hoh (MD)  (Signed 04-Jan-16 16:21)  Authored: Note Type, CC/HPI, Review of Systems, ALLERGIES, Patient Family Social History, HOME MEDICATIONS, Vital Signs, Physical Exam, Lab Results Review, Rad Results Review, Assessment and Plan   Last Updated: 04-Jan-16 16:21 by Delight Hoh (MD)

## 2014-09-15 NOTE — Consult Note (Signed)
Chief Complaint:  Subjective/Chief Complaint Resting quietly in bed denies any abdominal pain still NPO still on the suction slightly confused   VITAL SIGNS/ANCILLARY NOTES: **Vital Signs.:   22-Mar-16 04:44  Vital Signs Type Q 4hr  Temperature Temperature (F) 97.6  Celsius 36.4  Temperature Source oral  Pulse Pulse 76  Respirations Respirations 23  Systolic BP Systolic BP 774  Diastolic BP (mmHg) Diastolic BP (mmHg) 64  Mean BP 82  Pulse Ox % Pulse Ox % 93  Pulse Ox Activity Level  At rest  Oxygen Delivery 3L  Pulse Ox Heart Rate 74  Telemetry pattern Cardiac Rhythm Normal sinus rhythm  *Intake and Output.:   Daily 22-Mar-16 07:00  Grand Totals Intake:  9988.4 Output:  2050    Net:  7938.4 24 Hr.:  7938.4  IV (Primary)      In:  398.4  IV (Secondary)      In:  50  TPN cc     In:  9540  Urine ml     Out:  1300  Hemovac ml     Out:  400  Other Output ml     Out:  350  Length of Stay Totals Intake:  33447.6 Output:  18200    Net:  15247.6   Brief Assessment:  GEN well developed, well nourished, no acute distress, thin   Cardiac Regular  murmur present   Respiratory normal resp effort  clear BS   Gastrointestinal Normal   Gastrointestinal details normal Soft   EXTR negative cyanosis/clubbing, negative edema   Lab Results: Routine Chem:  22-Mar-16 06:01   Glucose, Serum  128 (65-99 NOTE: New Reference Range  07/09/14)  BUN 20 (6-20 NOTE: New Reference Range  07/09/14)  Creatinine (comp) 0.74 (0.61-1.24 NOTE: New Reference Range  07/09/14)  Sodium, Serum 143 (135-145 NOTE: New Reference Range  07/09/14)  Potassium, Serum 3.5 (3.5-5.1 NOTE: New Reference Range  07/09/14)  Chloride, Serum 106 (101-111 NOTE: New Reference Range  07/09/14)  CO2, Serum  34 (22-32 NOTE: New Reference Range  07/09/14)  Calcium (Total), Serum  7.6 (8.9-10.3 NOTE: New Reference Range  07/09/14)  Anion Gap  3  eGFR (African American) >60  eGFR (Non-African American)  >60 (eGFR values <61m/min/1.73 m2 may be an indication of chronic kidney disease (CKD). Calculated eGFR is useful in patients with stable renal function. The eGFR calculation will not be reliable in acutely ill patients when serum creatinine is changing rapidly. It is not useful in patients on dialysis. The eGFR calculation may not be applicable to patients at the low and high extremes of body sizes, pregnant women, and vegetarians.)  Result Comment LABS - This specimen was collected through an   - indwelling catheter or arterial line.  - A minimum of 56m of blood was wasted prior    - to collecting the sample.  Interpret  - results with caution.  Result(s) reported on 06 Aug 2014 at 06:48AM.  Magnesium, Serum 2.0 (1.7-2.4 THERAPEUTIC RANGE: 4-7 mg/dL TOXIC: > 10 mg/dL  ----------------------- NOTE: New Reference Range  07/09/14)  Phosphorus, Serum 2.8 (2.5-4.6 NOTE: New Reference Range  07/09/14)   Radiology Results: XRay:    13-Mar-16 08:32, Abdomen AP Only  Abdomen AP Only   REASON FOR EXAM:    abd distention  COMMENTS:   Bedside (portable):Y    PROCEDURE: DXR - DXR ABDOMEN AP ONLY  - Jul 28 2014  8:32AM     CLINICAL DATA:  New onset abdominal distention. History  of colon  cancer.    EXAM:  ABDOMEN - 1 VIEW    COMPARISON:  Were 05/14/2014    FINDINGS:  Dilated loops of small bowel are identified. There is a paucity of  colonic gas. No free intraperitoneal air identified. Ventral skin  staples noted.     IMPRESSION:  1. Dilated loops of small bowel which may reflect small-bowel  obstruction or postoperative ileus.      Electronically Signed    By: Kerby Moors M.D.    On: 07/28/2014 08:48         Verified By: Angelita Ingles, M.D.,    14-Mar-16 08:01, Chest Portable Single View  Chest Portable Single View   REASON FOR EXAM:    CHF  COMMENTS:       PROCEDURE: DXR - DXR PORTABLE CHEST SINGLE VIEW  - Jul 29 2014  8:01AM     CLINICAL DATA:   Increased dyspnea overnight, history of CHF, status  post colostomy reversal 4 days ago    EXAM:  PORTABLE CHEST - 1 VIEW    COMPARISON:  CT scan of the chest of April 19, 2014    FINDINGS:  There are small bilateral pleural effusions. The lungs are  hypoinflated. There is no alveolar infiltrate. The cardiac  silhouette is top-normal in size. The pulmonary vascularity is  mildly prominent centrally but no definite cephalization is  observed. There are loops of mildly distended small bowel in the  upper abdomen.     IMPRESSION:  1. There are new small bilateral pleural effusions with mild  bibasilar atelectasis.Low-grade pulmonary interstitial edema is  suspected as well.  2. Distended gas-filled small bowel loops in the upper abdomen.      Electronically Signed    By: David  Martinique    On: 07/29/2014 08:04         Verified By: DAVID A. Martinique, M.D., MD    14-Mar-16 08:01, KUB - Kidney Ureter Bladder  KUB - Kidney Ureter Bladder   REASON FOR EXAM:    ileus  COMMENTS:   Bedside (portable):Y    PROCEDURE: DXR - DXR KIDNEY URETER BLADDER  - Jul 29 2014  8:01AM     CLINICAL DATA:  Status post colostomy reversal 4 days ago, follow-up  of ileus -small bowel obstruction.    EXAM:  ABDOMEN - 1 VIEW    COMPARISON:  Abdominal series of December 28, 2014    FINDINGS:  There are persistent mildly distended small bowel loops throughout  the abdomen. No significant colonic gas is demonstrated. There is a  tiny amount of gas in the rectum.No free extraluminal gas  collections are demonstrated. There is are stable metallic densities  projecting over the right flank and right iliac crest. Midline lower  abdominal and pelvic skin staples are present. There are phleboliths  within the pelvis and calcifications in the region of the pancreatic  body.     IMPRESSION:  Stable bowel gas pattern consistent with a distal small bowel  obstruction or ileus.      Electronically Signed     By: David  Martinique    On: 07/29/2014 08:08         Verified By: DAVID A. Martinique, M.D., MD    15-Mar-16 07:50, Chest Portable Single View  Chest Portable Single View   REASON FOR EXAM:    SOB  COMMENTS:       PROCEDURE: DXR - DXR PORTABLE CHEST SINGLE VIEW  - Jul 30 2014  7:50AM     CLINICAL DATA:  Short of breath    EXAM:  PORTABLE CHEST - 1 VIEW    COMPARISON:  07/29/2014    FINDINGS:  No significant change bibasilar airspace disease left greater than  right with small bilateral effusions. Negative for vascular  congestion or heart failure. Decreased lung volume also stable.     IMPRESSION:  Bibasilar infiltrates and small effusions unchanged, probable  pneumonia.      Electronically Signed    By: Franchot Gallo M.D.    On: 07/30/2014 08:21         Verified By: Truett Perna, M.D.,    15-Mar-16 07:50, KUB - Kidney Ureter Bladder  KUB - Kidney Ureter Bladder   REASON FOR EXAM:    ileus  COMMENTS:   Bedside (portable):Y    PROCEDURE: DXR - DXR KIDNEY URETER BLADDER  - Jul 30 2014  7:50AM     CLINICAL DATA:  Status post colostomy were worse reversal on July 25, 2014, follow-up of ileus- small bowel obstruction.    EXAM:  ABDOMEN - 1 VIEW    COMPARISON:  Abdominal examinations of March 13th and July 29, 2014    FINDINGS:  There remain loops of mildly distended gas-filled small bowel  throughout the abdomen. There is no significant colonic gas. No free  extraluminal gas collections are demonstrated. There are  degenerative changes of the lumbar spine. There are midline skin  staples over the pelvis.     IMPRESSION:  Findings consistent with a persistent distal small bowel  obstruction. Nasogastric suction may be useful.      Electronically Signed    By: David  Martinique    On: 07/30/2014 08:19       Verified By: DAVID A. Martinique, M.D., MD    16-Mar-16 08:29, Chest Portable Single View  Chest Portable Single View   REASON FOR EXAM:    SOB  COMMENTS:        PROCEDURE: DXR - DXR PORTABLE CHEST SINGLE VIEW  - Jul 31 2014  8:29AM     CLINICAL DATA:  Shortness of breath post colostomy reversal    EXAM:  PORTABLE CHEST - 1 VIEW    COMPARISON:  the previous day's study    FINDINGS:  Small pleural effusions and bibasilar consolidation/atelectasis,  left greater than right, partially improved from previous exam.  Heart size upper limits normal for technique. Atheromatous aorta.  Partially calcified bilateral pleural plaques. No effusion.  No pneumothorax.  Spurring in the mid and lower thoracic spine.     IMPRESSION:  1. Bilateral small pleural effusions and bibasilar  atelectasis/consolidation, slightly improved since previous exam.      Electronically Signed  By: Lucrezia Europe M.D.    On: 07/31/2014 09:17         Verified By: Kandis Cocking, M.D.,    16-Mar-16 08:29, KUB - Kidney Ureter Bladder  KUB - Kidney Ureter Bladder   REASON FOR EXAM:    ileus  COMMENTS:   Bedside (portable):Y    PROCEDURE: DXR - DXR KIDNEY URETER BLADDER  - Jul 31 2014  8:29AM     CLINICAL DATA:  ileus    EXAM:  ABDOMEN - 1 VIEW    COMPARISON:  the previous day's study    FINDINGS:  Midline skin staples. Multiple dilated small bowel loops throughout  the abdomen, similar in number and degree of dilatation since  previous exam. Endoscopic clips in the  right lower quadrant. Left  pelvic phleboliths. Atheromatous aorta. Spondylitic changes in the  lumbar spine.    Regional bones unremarkable.     IMPRESSION:  1. Little change in small bowel dilatation since previous day's  exam.      Electronically Signed    By: Lucrezia Europe M.D.    On: 07/31/2014 09:10     Verified By: Kandis Cocking, M.D.,    16-Mar-16 11:13, Abdomen Flat and Erect  Abdomen Flat and Erect   REASON FOR EXAM:    abdominal distension  COMMENTS:       PROCEDURE: DXR - DXR ABDOMEN 2 V FLAT AND ERECT  - Jul 31 2014 11:13AM     CLINICAL DATA:  Subsequent evaluation  abdominal distension shortness  of breath weakness    EXAM:  ABDOMEN - 2 VIEW    COMPARISON:  07/31/2014    FINDINGS:  Mildly dilated loops of small bowel in the central abdomen. There  are air-fluid levels. Abdominal wall staples postsurgical are noted.  There does appear to be some gas into the large bowel.     IMPRESSION:  Persistent small bowel dilatation without significant interval  change.      Electronically Signed    By: Skipper Cliche M.D.    On: 07/31/2014 13:15         Verified By: Rachael Fee, M.D.,    17-Mar-16 08:58, Abdomen Flat and Erect  Abdomen Flat and Erect   REASON FOR EXAM:    ileus  COMMENTS:   Bedside (portable):Y    PROCEDURE: DXR - DXR ABDOMEN 2 V FLAT AND ERECT  - Aug 01 2014  8:58AM     CLINICAL DATA:  Ileus    EXAM:  ABDOMEN - 2 VIEW    COMPARISON:  July 31, 2014    FINDINGS:  Supine and uprightabdomen images were obtained. There is persistent  small bowel dilatation without appreciable air-fluid levels. No free  air. There are skin staples overlying the right lower abdomen and  pelvis. There are clips in the right lower quadrant region.  Nasogastric tube tip and side port are in the stomach.     IMPRESSION:  Persistent bowel dilatation without air-fluid levels. No free air.  The appearance is consistent with a degree of ileus.      Electronically Signed    By: Lowella Grip III M.D.    On: 08/01/2014 09:01         Verified By: Leafy Kindle. WOODRUFF, M.D.,    17-Mar-16 08:58, Chest Portable Single View  Chest Portable Single View   REASON FOR EXAM:    SOB  COMMENTS:       PROCEDURE: DXR - DXR PORTABLE CHEST SINGLE VIEW  - Aug 01 2014  8:58AM     CLINICAL DATA:  Difficulty breathing and wheezing    EXAM:  PORTABLE CHEST - 1 VIEW    COMPARISON:  July 31, 2014    FINDINGS:  Nasogastric tube tip is in the stomach. There are small pleural  effusions bilaterally with bibasilar atelectasis and  cardiomegaly.  The pulmonary vascularity is normal. No adenopathy. No bone lesions.     IMPRESSION:  Small pleural effusions and cardiomegaly. Suspect a degree of  congestive heart failure. Stable patchy bibasilar atelectasis. No  appreciable airspace consolidation.      Electronically Signed    By: Lowella Grip III M.D.    On: 08/01/2014 09:02  Verified By: Leafy Kindle. WOODRUFF, M.D.,    17-Mar-16 15:06, Chest Portable Single View  Chest Portable Single View   REASON FOR EXAM:    Picc Line Placement  COMMENTS:       PROCEDURE: DXR - DXR PORTABLE CHEST SINGLE VIEW  - Aug 01 2014  3:06PM     CLINICAL DATA:  PICC placement    EXAM:  PORTABLE CHEST - 1 VIEW    COMPARISON:  Chest x-ray from the same day 8:44 a.m.    FINDINGS:  New right upper extremity PICC, tip at the distal SVC.  Nasogastric tube tip is difficult to visualize compared to imaging  earlier the same day. No indication of displacement    Dilated bowel noted in the epigastric region, known from dedicated  imaging.    A peripheral left base opacity is again noted. Indistinct appearance  is suspicious for pneumonia. There is pleural-based calcification on  the right. Stable heart size and mediastinal contours.     IMPRESSION:  1. A new rightupper extremity PICC is in good position.  2. Persistence of the left basilar opacity, concerning for  pneumonia.  3. Trace pleural effusions and calcified pleural plaques.  Electronically Signed    By: Monte Fantasia M.D.    On: 08/01/2014 15:42         Verified By: Gilford Silvius, M.D.,    19-Mar-16 14:39, Abdomen Flat and Erect  Abdomen Flat and Erect   REASON FOR EXAM:    sbo  COMMENTS:       PROCEDURE: DXR - DXR ABDOMEN 2 V FLAT AND ERECT  - Aug 03 2014  2:39PM     CLINICAL DATA:  colostomy reversal yesterday, abd pain    EXAM:  ABDOMEN - 2 VIEW    COMPARISON:  CT 08/02/2014    FINDINGS:  NG tube extends into the distal stomach versus  second portion of the  duodenum. Dilated loops of small bowel up to 3 cm. No air-fluid  levels. Rectal drain in the pelvis.     IMPRESSION:  1. NG tube likely within the duodenum.  2. Dilated small bowel most consistent with postoperative ileus.      Electronically Signed    By: Suzy Bouchard M.D.    On: 08/03/2014 14:44         Verified By: Rennis Golden, M.D.,    22-Mar-16 07:23, Abdomen Flat and Erect  Abdomen Flat and Erect   REASON FOR EXAM:    f/u sbo vs ileus  COMMENTS:       PROCEDURE: DXR - DXR ABDOMEN 2 V FLAT AND ERECT  - Aug 06 2014  7:23AM     CLINICAL DATA:  Follow-up bowel distention.    EXAM:  ABDOMEN - 2 VIEW    COMPARISON:  08/03/2014.  CT 08/02/2014 .    FINDINGS:  NG tube noted in good anatomic position. Persistent slightly  improved small bowel distention. Colonic gas pattern is normal. No  free air or pneumatosis. Surgical clips and staples noted over the  lower abdomen. Calcified aortic aneurysm is present, reference made  to prior CT report. Bibasilar atelectasis.     IMPRESSION:  1. NG tube noted in good anatomic position.  2. Persistent but slightly improved small bowel distention. Colonic  gas pattern is normal. These findings are most consistent with  adynamic ileus. No free air.  3. Abdominal aortic aneurysm. Reference is made to CT report  08/02/2014.  4. Bibasilar atelectasis.  Electronically Signed    By: Marcello Moores  Register    On: 08/06/2014 07:47         Verified By: Osa Craver, M.D., MD  Cardiology:    12-Mar-16 08:23, ECG  Ventricular Rate 110  Atrial Rate 110  P-R Interval 124  QRS Duration 88  QT 318  QTc 430  P Axis 33  R Axis 7  T Axis 29  ECG interpretation   Sinus tachycardia with Premature supraventricular complexes  Otherwise normal ECG  When compared with ECG of 09-May-2014 12:24,  Premature supraventricular complexes are now Present  QRS voltage has decreased  T wave amplitude has  decreased in Anterior leads  Confirmed by PARASCHOS, ALEX ([id number]) on 07/29/2014 4:20:35 PM    Overreader: Miquel Dunn  ECG     13-Mar-16 19:43, ECG  Ventricular Rate 153  Atrial Rate 141  QRS Duration 90  QT 284  QTc 453  R Axis 6  T Axis 14  ECG interpretation   Atrial fibrillation with rapid ventricular response  Statement Not Found (#1024)  Abnormal ECG  When compared with ECG of 27-Jul-2014 08:23,  Atrial fibrillation has replaced Sinus rhythm  ST now depressed in Anterolateral leads  Confirmed by PARASCHOS, ALEX ([id number]) on 07/29/2014 4:20:15 PM    Overreader: PARASCHOS, ALEX  ECG     14-Mar-16 13:19, Echo Doppler  Echo Doppler   REASON FOR EXAM:      COMMENTS:       PROCEDURE: [place] - ECHO DOPPLER COMPLETE(TRANSTHOR)  - Jul 29 2014  1:19PM     RESULT: Echocardiogram Report    Patient Name:   Douglas Ramsey Date of Exam: 07/29/2014  Medical Rec #:  [id number]          Custom1:  Date of Birth:  1919-04-17       Height:       73.0 in  Patient Age:    81 years        Weight:       160.0 lb  Patient Gender: M               BSA:          1.96 m??    Indications: Atrial Fib  Sonographer:    Sherrie Sport RDCS  Referring Phys: Marlyce Huge, A    Sonographer Comments: Technically challenging study due to less than   ideal echo windows and Due to rapid heart rate.    Summary:   1. Left ventricular ejection fraction, by visual estimation, is 40 to   45%.   2. Mildly decreased global left ventricular systolic function.   3. Mildly dilated left atrium.   4. Mildly dilated right atrium.   5. Mild to moderate mitral valve regurgitation.   6. Mild to moderate tricuspid regurgitation.  2D AND M-MODE MEASUREMENTS (normal ranges within parentheses):  Left Ventricle:          Normal  IVSd (2D):      1.05 cm (0.7-1.1)  LVPWd (2D):     0.79 cm (0.7-1.1) Aorta/LA:                  Normal  LVIDd (2D):     4.54 cm (3.4-5.7) Aortic Root (2D): 3.90 cm (2.4-3.7)  LVIDs (2D):      3.95 cm         Left Atrium (2D): 5.50 cm (1.9-4.0)  LV FS (2D):     13.0 %   (>25%)  LV EF (2D):     28.0 %   (>50%)                                    Right Ventricle:                                    RVd (2D):        2.44 cm  LV DIASTOLIC FUNCTION:  MVPeak E: 0.87 m/s E/e' Ratio: 8.90                      Decel Time: 185 msec  SPECTRAL DOPPLER ANALYSIS (where applicable):  Mitral Valve:  MV P1/2 Time: 53.65 msec  MV Area, PHT: 4.10 cm??  Aortic Valve: AoV Max Vel: 1.17 m/s AoV Peak PG: 5.4 mmHg AoVMean PG:   3.7 mmHg  LVOT Vmax: 0.52 m/s LVOT VTI: 0.090 m LVOT Diameter: 2.20 cm  AoV Area, Vmax: 1.69 cm?? AoV Area, VTI: 1.84 cm?? AoV Area, Vmn: 1.53 cm??  Tricuspid Valve and PA/RV Systolic Pressure: TR Max Velocity: 2.76 m/s RA   Pressure: 5 mmHg RVSP/PASP: 35.5 mmHg  Pulmonic Valve:  PV Max Velocity: 0.79 m/s PV Max PG: 2.5 mmHg PV Mean PG:    PHYSICIAN INTERPRETATION:  Left Ventricle: The left ventricular internal cavity size was normal. LV   posterior wall thickness was normal. No left ventricular hypertrophy.   Global LV systolic function was mildly decreased. Left ventricular   ejection fraction, by visual estimation, is 40 to 45%. Spectral Doppler   shows normal pattern of LV diastolic filling.  Right Ventricle: The right ventricularsize is normal.  Left Atrium: The left atrium is mildly dilated.  Right Atrium: The right atrium is mildly dilated.  Pericardium: There is no evidence of pericardial effusion.  Mitral Valve: Mild to moderate mitral valve regurgitation is seen.  Tricuspid Valve: Mild to moderate tricuspid regurgitation is visualized.   The tricuspid regurgitant velocity is 2.76 m/s, and with an assumed right   atrial pressure of 5 mmHg, the estimated right ventricular systolic   pressure is normal at 35.5 mmHg.  Aortic Valve: The aortic valve is normal.  Aorta: The aortic root is normal in size and structure.    [place] MD  Electronically  signed by [id number] Isaias Cowman MD  Signature Date/Time: 07/29/2014/4:14:35 PM  *** Final ***    IMPRESSION: .        Verified BySheppard Coil . PARASCHOS, M.D., MD    16-Mar-16 08:52, ECG  Ventricular Rate 83  Atrial Rate 83  P-R Interval 126  QRS Duration 106  QT 392  QTc 460  P Axis 49  R Axis 14  T Axis 38  ECG interpretation   Sinus rhythm with marked sinus arrhythmia  Otherwise normal ECG  When compared with ECG of 28-Jul-2014 19:43,  Sinus rhythm has replaced Atrial fibrillation  Vent. rate has decreased BY  70 BPM  ST no longer depressed in Lateral leads  Confirmed by PARASCHOS, ALEX ([id number]) on 07/31/2014 5:28:32 PM    Overreader: PARASCHOS, ALEX  ECG   CT:    18-Mar-16 09:10, CT Abdomen and Pelvis Without Contrast  CT Abdomen and Pelvis Without Contrast   REASON FOR EXAM:    (1) distension POD 8; (2) distension POD 8  COMMENTS:  PROCEDURE: CT  - CT ABDOMEN AND PELVIS W0  - Aug 02 2014  9:10AM     CLINICAL DATA:  Postop day 8 laparotomy. Distended abdomen. Colon  carcinoma    EXAM:  CT ABDOMEN ANDPELVIS WITHOUT CONTRAST    TECHNIQUE:  Multidetector CT imaging of the abdomen and pelvis was performed  following the standard protocol without IV contrast.  COMPARISON:  CT 04/24/2014    FINDINGS:  Small to moderate bilateral pleural effusions withcompressive  atelectasis in the lung bases. Calcified pleural plaques bilaterally  consistent with prior asbestos exposure. Cardiac enlargement.  Coronary calcification.    Hepatic cysts as noted on the prior CT. No new hepatic lesion.  Spleen is normal in size. Pancreas shows no edema or mass.  Gallbladder has contrast related to prior intravenous contrast  administration.    Negative for renal mass or obstruction. Left renal cyst is  unchanged. No renal calculi. Urinary bladder contains a Foley  catheter without abnormality.    Abdominal aortic aneurysm unchanged measuring 4.9 x 5.5 cm. No  evidence of  aneurysm leak. Atherosclerotic disease throughout the  iliac arteries.    Postop resection of a mass in the right colon. Surgical anastomosis  is present in the right lower quadrant. The terminal ileum is  visualized and is not dilated. There is diffuse small bowel  dilatation throughout the remainder of the bowel with multiple  air-fluid levels consistent with small bowel obstruction. Note is  made of a right inguinal hernia which was present previously.  Dilated small bowel extends into the hernia and the hernia may be  the cause of small bowel obstruction. In addition, there are  anastomotic clips of the small bowel within the hernia. Left  inguinal hernia also present not containing bowel but fluid. Colon  is decompressed.    Small amount of free fluid in the abdomen consistent with recent  surgery. Fluid collection in the anterior abdominal wall measures  approximately 15 x 9cm compatible with postop seroma. Infection not  excluded and correlation with physical examination recommended.     IMPRESSION:  Postop resection of a mass in the right colon.    Small bowel dilatation with air-fluid levels consistent with small  bowel obstruction. Bowel is dilated down to the region of  anastomosis in the small bowel. This is in the area of a right  inguinal hernia and the hernia could be causing obstruction. Colon  decompressed.    Small amount of free fluid in the abdomen.    Postop seroma in the anterior abdominal wall. Correlate clinically  for any signs of infection in the anterior abdominal wall.    Abdominal aortic aneurysm measuring 4.9 x 5.5 cm unchanged from the  prior study. Recommend followup by abdomen and pelvis CTA in 3-6  months, and vascular surgery referral/consultation if not already  obtained. This recommendation follows ACR consensus guidelines:  White Paper of the ACR Incidental Findings Committee II on Vascular  Findings. J Am Coll Radiol 2013;  10:789-794.    Electronically Signed    By: Franchot Gallo M.D.    On: 08/02/2014 09:43         Verified By: Truett Perna, M.D.,   Assessment/Plan:  Invasive Device Daily Assessment of Necessity:  Does the patient currently have any of the following indwelling devices? foley  central line catheter   Indwelling Urinary Catheter continued, requirement due to   Reason to continue Indwelling Urinary Catheter for strict Intake/Output monitoring for hemodynamic  instability   Central Line continued, requirement due to   Reason to continue [employer] When IV access will be required for less than 8 weeks   Assessment/Plan:  Assessment IMP  generalized fatigue  postop from surgery  weakness  atrial fibrillation on amiodarone  abdominal pain with obstruction  dehydration  weakness  confusion  anemia .   Plan agree with palliative care recommend rehab  agree with surgical input therapy for abdominal pain  switch amiodarone to IV  use metoprolol IV intermittently for tachycardia  do not recommend anticoagulation   continue DVT prophylaxis  follow-up H&H for anemia  conservative cardiology care at this point  agree with palliative care   Electronic Signatures: Yolonda Kida (MD)  (Signed 22-Mar-16 12:45)  Authored: Chief Complaint, VITAL SIGNS/ANCILLARY NOTES, Brief Assessment, Lab Results, Radiology Results, Assessment/Plan   Last Updated: 22-Mar-16 12:45 by Yolonda Kida (MD)

## 2014-09-15 NOTE — Discharge Summary (Signed)
PATIENT NAME:  Douglas Ramsey, Douglas Ramsey MR#:  [id number] DATE OF BIRTH:  July 29, 1918  DATE OF ADMISSION:  05/09/2014 DATE OF DISCHARGE:  05/22/2014  TYPE OF DISCHARGE: Patient transfer to a skilled nursing facility.   REASON FOR ADMISSION: Symptomatic anemia.   HISTORY OF PRESENT ILLNESS: The patient is a 81 year old male with a significant history of coronary artery disease, COPD, BPH, and renal insufficiency. He presented to the cardiologist's office with weakness and was found to have a hemoglobin of 5. He was sent to the Emergency Room for admission.   PAST MEDICAL HISTORY: 1. ASCVD status post PTCA with stent placement.  2. COPD.  3. BPH.  4. Hyperlipidemia.  5. Renal insufficiency.  6. GE reflux disease.   MEDICATIONS ON ADMISSION: Please see admission note.   ALLERGIES: No known drug allergies.   SOCIAL HISTORY: Negative for alcohol or tobacco abuse.   FAMILY HISTORY: Noncontributory.   REVIEW OF SYSTEMS: As per admission note.   PHYSICAL EXAMINATION:  GENERAL: The patient was in no acute distress.  VITAL SIGNS: Stable and he was afebrile.  HEENT: Unremarkable.  NECK: Supple without JVD.  LUNGS: Clear.  CARDIAC: Revealed a regular rate and rhythm with a normal S1 and S2.  ABDOMEN: Soft and nontender.  EXTREMITIES: Without edema.  NEUROLOGIC: Grossly nonfocal.   HOSPITAL COURSE: The patient was admitted with severe symptomatic anemia with underlying heart disease. He was transfused 2 units of packed red blood cells. He was seen in consultation by GI. He underwent a colonoscopy, which revealed a large colonic mass consistent with cancer. Biopsies did reveal colon cancer. He was subsequently seen by surgery and underwent colectomy with colostomy. He did fairly well postoperatively except for abdominal pain. His diet was advanced slowly with good output from the ostomy. He was taken off IV pain medications and switched to oral medications, and continued to do well. His hemoglobin  remained stable with no evidence of postoperative bleeding. He was seen by physical therapy, who recommended placement for rehabilitation. He was also seen by palliative care and made a DNR per Dr. Johnney Killian. He is now stable and ready for transfer to a skilled nursing for further care and rehabilitation.   DISCHARGE DIAGNOSES: 1. Symptomatic anemia.  2. Gastrointestinal bleed.  3. Colon cancer.  4. Atherosclerotic cardiovascular disease.  5. Renal insufficiency.  6. Chronic anemia.  7. Chronic obstructive pulmonary disease.   DISCHARGE MEDICATIONS:  1. Combivent Respimat 1 puff 4 times daily.  2. Advair 250/50 one puff b.i.d.  3. Zofran 4 mg p.o. q.6 h. p.r.n. nausea and vomiting.  4. Protonix 40 mg p.o. daily.  5. Zocor 20 mg p.o. at bedtime.  6. Spiriva 1 capsule inhaled daily.  7. Doxycycline 100 mg p.o. b.i.d. x 1 week.  8. Duke's mouthwash 10 mL p.o. t.i.d. x 10 days.  9. Norco 5/325 one to two p.o. q.4 h. p.r.n. pain.   FOLLOW-UP PLANS AND APPOINTMENTS: The patient is a NO CODE BLUE/DO NOT RESUSCITATE. He will be followed by the resident physician at the skilled nursing facility. He is on a regular diet with Ensure supplements t.i.d. He will be seen in consultation by physical therapy for ambulation. Colostomy care per facility routine. Thigh-high TED hose worn daily, off at bedtime. Will obtain a CBC and a MET-B in 1 week.    ____________________________ Leonie Douglas. Doy Hutching, MD jds:JT D: 05/22/2014 07:41:51 ET T: 05/22/2014 08:12:54 ET JOB#: [id number]  cc: Leonie Douglas. Doy Hutching, MD, <Dictator> Manuelita Moxon Lennice Sites MD ELECTRONICALLY  SIGNED 05/22/2014 12:50

## 2014-09-15 NOTE — Consult Note (Signed)
Brief Consult Note: Diagnosis: Tachycardia, Dehydration.   Patient was seen by consultant.   Consult note dictated.   Recommend further assessment or treatment.   Orders entered.   Comments: appears dehydrated- agree with IV fluids at 150 ml/hr.  WIll continue to follow with you. Check renal function tomorrow.  Electronic Signatures: Vaughan Basta (MD)  (Signed 12-Mar-16 11:30)  Authored: Brief Consult Note   Last Updated: 12-Mar-16 11:30 by Vaughan Basta (MD)

## 2014-09-15 NOTE — Op Note (Signed)
PATIENT NAME:  Douglas Ramsey, Douglas Ramsey MR#:  [id number] DATE OF BIRTH:  08-13-1918  DATE OF PROCEDURE:  08/02/2014  PREOPERATIVE DIAGNOSIS: Fascial disruption.   POSTOPERATIVE DIAGNOSIS: Fascial disruption.   PROCEDURE PERFORMED: Wound examination under anesthesia and repair of fascial disruption with application of Wound VAC assisted closure device.   SURGEON: Sherri Rad, MD.   ASSISTANT: None.   ANESTHESIA: General with LMA.   FINDINGS: There was a fascial disruption due to a loosening of the looped PDS in the upper portion of the abdomen. The fascia appeared to be intact. No hematoma. No abscess. Knots were intact.   DESCRIPTION OF PROCEDURE: With informed consent, supine position, and general anesthesia being induced, the patient's abdomen was widely prepped and draped with Betadine solution and timeout was observed. Existing skin staples were removed. Skin was separated carefully. The length of the wound was examined. The fascia appeared to be intact. In manipulating the abdomen I could find some fluid emanating from the portion of the midline fascia in the upper portion. I found that there was a substantially loose looped PDS in this area and this was raised up with a hemostat clamp. An additional number 1 PDS was placed across the defect carefully. This was tied. It held nicely. The loop was then tied to this suture. The wound was then irrigated. Skin edges were reapproximated again with stapler. An incisional Wound VAC assisted closure device was placed with Ioban, Adaptic, and black foam. Excellent seal was obtained. The patient tolerated the procedure well without immediate complication and was extubated and taken to the recovery room in stable and satisfactory condition by anesthesia services.     ____________________________ [name], MD mab:bu D: 08/02/2014 17:54:10 ET T: 08/02/2014 18:35:39 ET JOB#: [id number]  cc: Elta Guadeloupe A. Marina Gravel, MD, <Dictator> [name] MD ELECTRONICALLY SIGNED  08/07/2014 13:49

## 2014-09-15 NOTE — Op Note (Signed)
PATIENT NAME:  Douglas Ramsey, Douglas Ramsey MR#:  [id number] DATE OF BIRTH:  Sep 19, 1918  DATE OF PROCEDURE:  07/25/2014  PREOPERATIVE:  Severe phimosis, inability to retract the foreskin.  POSTOPERATIVE DIAGNOSIS: Severe phimosis, inability to retract the foreskin.  PROCEDURE:   Dorsal slit for placement of Foley catheter.   SURGEON: Amisadai Woodford D. Elnoria Howard, DO   ANESTHESIA: General.   HISTORY:  I was called into the room because a Foley could not be placed secondary to a severe phimosis and inability to retract the foreskin. So, I did a dorsal slit and placed the Foley catheter for the surgeons. The patient is a 81 year old who has had a colectomy and is about to undergo a reanastomosis.   PROCEDURE IN DETAIL:  So, the dorsal slit is done under sterile conditions with sterile gloves and  sterile prep of the foreskin. I placed a clamp on the midline, it was a straight clamp, and crushed the tissue beneath the clamp, and then to the extent of the proximal portion of the clamp, I incised the skin, oversewed each side of the incised bleeding skin with a running 2-0 chromic. Then the Foley was placed easily into the bladder with a good amount of urine returned. Then the patient is prepped for the surgery.     ____________________________ Janice Coffin. Elnoria Howard, DO rdh:tr D: 07/25/2014 10:21:31 ET T: 07/25/2014 12:06:23 ET JOB#: [id number]  cc: Janice Coffin. Elnoria Howard, DO, <Dictator> Owens Hara D Solange Emry DO ELECTRONICALLY SIGNED 07/29/2014 7:38

## 2014-09-15 NOTE — Op Note (Signed)
PATIENT NAME:  Douglas Ramsey, Douglas Ramsey MR#:  [id number] DATE OF BIRTH:  1918-06-11  DATE OF PROCEDURE:  05/16/2014  PREOPERATIVE DIAGNOSES:  1. Right colon cancer.  2. Neoplastic sigmoid rectal polyp.   POSTOPERATIVE DIAGNOSES: 1. Right colon cancer.  2. Neoplastic sigmoid rectal polyp.   PROCEDURE PERFORMED: 1. Right hemicolectomy with anastomosis.  2. Low anterior resection with anastomosis. 3. Diverting loop ileostomy.   ANESTHESIA: General.   ESTIMATED BLOOD LOSS: 200 mL.   COMPLICATIONS: None.   SPECIMENS: Right colon and sigmoid and sigmoid/rectum.   INDICATION FOR SURGERY: Douglas Ramsey is a pleasant 81 year old who had his first colonoscopy for anemia and was found to have a large right colon mass as well as a polyp which was biopsied and shown to be carcinomatous extending to the areas of resection. He was thus brought to the operating room for definitive surgery to treat both lesions.   DETAILS OF PROCEDURE: Informed consent was obtained. Douglas Ramsey was brought to the operating room suite. He was induced. Endotracheal tube was placed. General anesthesia was administered. The abdomen was prepped and draped in a surgical fashion. A timeout was then performed correctly identifying the patient name, operative site and procedure to be performed. A midline incision was made. This was deepened down to the fascia. The fascia was incised. The peritoneum was entered.  The attention was made first to the right colon. There was a large mass with mesenteric lymphadenopathy. I then used a GIA 75 stapler to resect the right colon proximally ligating the terminal ileum and distally ligating to the right of the middle colic artery. I tried to limit my resection because he was going to need 2 resections. A harmonic was then used to perform a low ligation in the mesentery. There were palpable lymph nodes which were sent with the specimen. The specimen was then sent off. A side-to-side functional end-to-end  anastomosis was made by performing an enterotomy and a colotomy forming a common channel with a GIA 75 stapler and then closing the colotomy with a TA-60 stapler.  Next, the attention was made to the rectum. I transected. I incised the white line of Toldt in the left colon and then proceeded downwards. I did find a tattooing and then, as I encircled the  colon distally, I found another area of tattooing. I used a contour to ligate the colon distal to this more distal tattoo and a GIA 75 to transect approximately 5 cm proximal to the more proximal tattoo. A Harmonic scalpel was used to transect the mesentery and the specimen was sent. An end-to-end EEA anastomosis was made by placing a stapler in the anus, and this was connected to the sigmoid colon. Leak tests were performed, which showed no air leak. Because of the low aspect and the need for possible chemotherapy, I then performed a diverting loop ileostomy in the right lower quadrant. This ostomy was prepped matured with 4-0 Monocryl suture. Following this, prior to maturation, the wound was closed in a sterile setting with 2 looped #1 PDS run from both directions and tied in the middle. Staples then completed the dressing, and then the ostomy was matured. A sterile dressing and ostomy appliance were placed over the wounds.  The patient was then awoken, extubated and brought to the postanesthesia care unit. There were no immediate complications. Needle, sponge, and instrument counts were correct at the end of the procedure.     ____________________________ Glena Norfolk. Bryne Lindon, MD cal:jh D: 05/24/2014 08:51:21 ET T:  05/24/2014 11:36:16 ET JOB#: [id number]  cc: Harrell Gave A. Louie Meaders, MD, <Dictator> Floyde Parkins MD ELECTRONICALLY SIGNED 05/28/2014 19:53

## 2014-09-15 NOTE — Consult Note (Signed)
PATIENT NAME:  Douglas Ramsey, Douglas Ramsey MR#:  [id number] DATE OF BIRTH:  [birth year]  DATE OF CONSULTATION:  07/29/2014  REFERRING PHYSICIAN:   CONSULTING PHYSICIAN:  Isaias Cowman, MD  PRIMARY CARE PHYSICIAN: Fulton Reek, MD  PRIMARY CARDIOLOGIST: Serafina Royals, MD  CHIEF COMPLAINT: Shortness of breath.   REASON FOR CONSULTATION: Atrial fibrillation and non-ST elevation myocardial infarction.   HISTORY OF PRESENT ILLNESS: The patient is a 81 year old gentleman with known history of coronary artery disease status post prior coronary stent with colon cancer status post colostomy and chemotherapy who underwent reversal of colostomy. The patient was doing well postoperatively, developed tachycardia felt to be due to dehydration with acute renal failure. The patient developed respiratory failure, noted to be in atrial fibrillation with a rapid ventricular rate. The patient was transferred to the CCU where he initially was treated with Cardizem drip, which was switched to amiodarone drip. The patient has not restarted his metoprolol. The patient currently denies chest pain. Labs were notable for elevated troponin of 1.41.   PAST MEDICAL HISTORY: 1.  Coronary artery disease, status post coronary stent in 2008.  2.  Colon cancer.  3.  COPD, on O2 therapy.  4.  Hyperlipidemia.  5.  Chronic kidney disease.   MEDICATIONS: Metoprolol 12.5 mg b.i.d., Combivent inhaler q.i.d., Advair Diskus 1 puff b.i.d., hydromorphone PCA drip and nitroglycerin 0.4 mg sublingual p.r.n.   SOCIAL HISTORY: The patient is married, resides with his wife. Denies tobacco abuse.   FAMILY HISTORY: No immediate family history of myocardial infarction or coronary artery disease.   REVIEW OF SYSTEMS: CONSTITUTIONAL: No fever or chills.  EYES: No blurry vision.  EARS: No hearing loss.  RESPIRATORY: Shortness of breath.  CARDIOVASCULAR: No chest pain.  GASTROINTESTINAL: The patient just underwent reversal of colostomy.   GENITOURINARY: No dysuria or hematuria.  ENDOCRINE: No polyuria or polydipsia.  MUSCULOSKELETAL: No arthralgias or myalgias.  NEUROLOGIC: No focal muscle weakness or numbness.  PSYCHOLOGICAL: No depression or anxiety.   PHYSICAL EXAMINATION: VITAL SIGNS: Blood pressure 119/66, pulse 125 and irregularly irregular, respirations 25, temperature 98.5, pulse ox 95%.  HEENT: Pupils equal and reactive to light and accommodation.  NECK: Supple without thyromegaly.  LUNGS: Clear.  HEART: Normal JVP. Normal PMI. Tachycardia with irregularly irregular rhythm. Normal S1, S2. No appreciable gallop, murmur, or rub.  ABDOMEN: Soft and nontender. Pulses were intact bilaterally.  MUSCULOSKELETAL: Normal muscle tone.  NEUROLOGIC: The patient is alert and oriented x3. Motor and sensory are both grossly intact.   IMPRESSION: A 81 year old gentleman status post reversal colostomy who was doing well postoperative, initially developed tachycardia felt to be due to dehydration, now in atrial fibrillation with rapid ventricular rate with elevated troponin very likely due to demand/supply ischemia of atrial fibrillation. The patient currently denies chest pain.   RECOMMENDATIONS: 1.  Agree with overall current therapy.  2.  Consider full dosed anticoagulation if not a risk due to recent surgery.  3.  Continue amiodarone drip for now.  4.  Proceed metoprolol 25 mg p.o. q. 6, first dose now.  5.  Further recommendations pending the patient's clinical course.  ____________________________ Isaias Cowman, MD ap:sb D: 07/29/2014 08:22:06 ET T: 07/29/2014 09:02:47 ET JOB#: [id number]  cc: Isaias Cowman, MD, <Dictator> Isaias Cowman MD ELECTRONICALLY SIGNED 08/13/2014 12:48

## 2014-09-15 NOTE — Consult Note (Signed)
Chief Complaint:  Subjective/Chief Complaint Still NPO improves abdominal pain no fever denies shortness of breath.   VITAL SIGNS/ANCILLARY NOTES: **Vital Signs.:   21-Mar-16 06:17  Vital Signs Type Routine  Temperature Temperature (F) 98.1  Celsius 36.7  Temperature Source oral  Pulse Pulse 84  Respirations Respirations 18  Systolic BP Systolic BP 673  Diastolic BP (mmHg) Diastolic BP (mmHg) 69  Mean BP 86  Pulse Ox % Pulse Ox % 90  Pulse Ox Activity Level  At rest  Oxygen Delivery 3L  *Intake and Output.:   Daily 21-Mar-16 07:00  Grand Totals Intake:  3131.2 Output:  2500    Net:  631.2 24 Hr.:  631.2  IV (Primary)      In:  369.2  IV (Secondary)      In:  866  TPN cc     In:  1876  Other Intake cc     In:  20  Urine ml     Out:  1700  Hemovac ml     Out:  500  Other Output ml     Out:  300  Length of Stay Totals Intake:  23459.2 Output:  16150    Net:  7309.2   Brief Assessment:  GEN well developed, well nourished, no acute distress, thin   Cardiac Regular  murmur present   Respiratory normal resp effort  clear BS   Gastrointestinal Normal   Gastrointestinal details normal Soft   EXTR negative cyanosis/clubbing, negative edema   Lab Results: Routine Chem:  21-Mar-16 05:03   Glucose, Serum  109 (65-99 NOTE: New Reference Range  07/09/14)  BUN 18 (6-20 NOTE: New Reference Range  07/09/14)  Creatinine (comp) 0.83 (0.61-1.24 NOTE: New Reference Range  07/09/14)  Sodium, Serum 141 (135-145 NOTE: New Reference Range  07/09/14)  Potassium, Serum 3.5 (3.5-5.1 NOTE: New Reference Range  07/09/14)  Chloride, Serum 103 (101-111 NOTE: New Reference Range  07/09/14)  CO2, Serum 32 (22-32 NOTE: New Reference Range  07/09/14)  Calcium (Total), Serum  8.1 (8.9-10.3 NOTE: New Reference Range  07/09/14)  Anion Gap  6  eGFR (African American) >60  eGFR (Non-African American) >60 (eGFR values <74m/min/1.73 m2 may be an indication of chronic kidney  disease (CKD). Calculated eGFR is useful in patients with stable renal function. The eGFR calculation will not be reliable in acutely ill patients when serum creatinine is changing rapidly. It is not useful in patients on dialysis. The eGFR calculation may not be applicable to patients at the low and high extremes of body sizes, pregnant women, and vegetarians.)  Result Comment LABS - This specimen was collected through an   - indwelling catheter or arterial line.  - A minimum of 531m of blood was wasted prior    - to collecting the sample.  Interpret  - results with caution.  Result(s) reported on 05 Aug 2014 at 05:34AM.  Magnesium, Serum 2.0 (1.7-2.4 THERAPEUTIC RANGE: 4-7 mg/dL TOXIC: > 10 mg/dL  ----------------------- NOTE: New Reference Range  07/09/14)  Phosphorus, Serum 3.0 (2.5-4.6 NOTE: New Reference Range  07/09/14)   Radiology Results: XRay:    13-Mar-16 08:32, Abdomen AP Only  Abdomen AP Only   REASON FOR EXAM:    abd distention  COMMENTS:   Bedside (portable):Y    PROCEDURE: DXR - DXR ABDOMEN AP ONLY  - Jul 28 2014  8:32AM     CLINICAL DATA:  New onset abdominal distention. History of colon  cancer.    EXAM:  ABDOMEN -  1 VIEW    COMPARISON:  Were 05/14/2014    FINDINGS:  Dilated loops of small bowel are identified. There is a paucity of  colonic gas. No free intraperitoneal air identified. Ventral skin  staples noted.     IMPRESSION:  1. Dilated loops of small bowel which may reflect small-bowel  obstruction or postoperative ileus.      Electronically Signed    By: Kerby Moors M.D.    On: 07/28/2014 08:48         Verified By: Angelita Ingles, M.D.,    14-Mar-16 08:01, Chest Portable Single View  Chest Portable Single View   REASON FOR EXAM:    CHF  COMMENTS:       PROCEDURE: DXR - DXR PORTABLE CHEST SINGLE VIEW  - Jul 29 2014  8:01AM     CLINICAL DATA:  Increased dyspnea overnight, history of CHF, status  post colostomy reversal 4  days ago    EXAM:  PORTABLE CHEST - 1 VIEW    COMPARISON:  CT scan of the chest of April 19, 2014    FINDINGS:  There are small bilateral pleural effusions. The lungs are  hypoinflated. There is no alveolar infiltrate. The cardiac  silhouette is top-normal in size. The pulmonary vascularity is  mildly prominent centrally but no definite cephalization is  observed. There are loops of mildly distended small bowel in the  upper abdomen.     IMPRESSION:  1. There are new small bilateral pleural effusions with mild  bibasilar atelectasis.Low-grade pulmonary interstitial edema is  suspected as well.  2. Distended gas-filled small bowel loops in the upper abdomen.      Electronically Signed    By: David  Martinique    On: 07/29/2014 08:04         Verified By: DAVID A. Martinique, M.D., MD    14-Mar-16 08:01, KUB - Kidney Ureter Bladder  KUB - Kidney Ureter Bladder   REASON FOR EXAM:    ileus  COMMENTS:   Bedside (portable):Y    PROCEDURE: DXR - DXR KIDNEY URETER BLADDER  - Jul 29 2014  8:01AM     CLINICAL DATA:  Status post colostomy reversal 4 days ago, follow-up  of ileus -small bowel obstruction.    EXAM:  ABDOMEN - 1 VIEW    COMPARISON:  Abdominal series of December 28, 2014    FINDINGS:  There are persistent mildly distended small bowel loops throughout  the abdomen. No significant colonic gas is demonstrated. There is a  tiny amount of gas in the rectum.No free extraluminal gas  collections are demonstrated. There is are stable metallic densities  projecting over the right flank and right iliac crest. Midline lower  abdominal and pelvic skin staples are present. There are phleboliths  within the pelvis and calcifications in the region of the pancreatic  body.     IMPRESSION:  Stable bowel gas pattern consistent with a distal small bowel  obstruction or ileus.      Electronically Signed    By: David  Martinique    On: 07/29/2014 08:08         Verified By: DAVID A.  Martinique, M.D., MD    15-Mar-16 07:50, Chest Portable Single View  Chest Portable Single View   REASON FOR EXAM:    SOB  COMMENTS:       PROCEDURE: DXR - DXR PORTABLE CHEST SINGLE VIEW  - Jul 30 2014  7:50AM     CLINICAL DATA:  Short  of breath    EXAM:  PORTABLE CHEST - 1 VIEW    COMPARISON:  07/29/2014    FINDINGS:  No significant change bibasilar airspace disease left greater than  right with small bilateral effusions. Negative for vascular  congestion or heart failure. Decreased lung volume also stable.     IMPRESSION:  Bibasilar infiltrates and small effusions unchanged, probable  pneumonia.      Electronically Signed    By: Franchot Gallo M.D.    On: 07/30/2014 08:21         Verified By: Truett Perna, M.D.,    15-Mar-16 07:50, KUB - Kidney Ureter Bladder  KUB - Kidney Ureter Bladder   REASON FOR EXAM:    ileus  COMMENTS:   Bedside (portable):Y    PROCEDURE: DXR - DXR KIDNEY URETER BLADDER  - Jul 30 2014  7:50AM     CLINICAL DATA:  Status post colostomy were worse reversal on July 25, 2014, follow-up of ileus- small bowel obstruction.    EXAM:  ABDOMEN - 1 VIEW    COMPARISON:  Abdominal examinations of March 13th and July 29, 2014    FINDINGS:  There remain loops of mildly distended gas-filled small bowel  throughout the abdomen. There is no significant colonic gas. No free  extraluminal gas collections are demonstrated. There are  degenerative changes of the lumbar spine. There are midline skin  staples over the pelvis.     IMPRESSION:  Findings consistent with a persistent distal small bowel  obstruction. Nasogastric suction may be useful.      Electronically Signed    By: David  Martinique    On: 07/30/2014 08:19       Verified By: DAVID A. Martinique, M.D., MD    16-Mar-16 08:29, Chest Portable Single View  Chest Portable Single View   REASON FOR EXAM:    SOB  COMMENTS:       PROCEDURE: DXR - DXR PORTABLE CHEST SINGLE VIEW  - Jul 31 2014   8:29AM     CLINICAL DATA:  Shortness of breath post colostomy reversal    EXAM:  PORTABLE CHEST - 1 VIEW    COMPARISON:  the previous day's study    FINDINGS:  Small pleural effusions and bibasilar consolidation/atelectasis,  left greater than right, partially improved from previous exam.  Heart size upper limits normal for technique. Atheromatous aorta.  Partially calcified bilateral pleural plaques. No effusion.  No pneumothorax.  Spurring in the mid and lower thoracic spine.     IMPRESSION:  1. Bilateral small pleural effusions and bibasilar  atelectasis/consolidation, slightly improved since previous exam.      Electronically Signed  By: Lucrezia Europe M.D.    On: 07/31/2014 09:17         Verified By: Kandis Cocking, M.D.,    16-Mar-16 08:29, KUB - Kidney Ureter Bladder  KUB - Kidney Ureter Bladder   REASON FOR EXAM:    ileus  COMMENTS:   Bedside (portable):Y    PROCEDURE: DXR - DXR KIDNEY URETER BLADDER  - Jul 31 2014  8:29AM     CLINICAL DATA:  ileus    EXAM:  ABDOMEN - 1 VIEW    COMPARISON:  the previous day's study    FINDINGS:  Midline skin staples. Multiple dilated small bowel loops throughout  the abdomen, similar in number and degree of dilatation since  previous exam. Endoscopic clips in the right lower quadrant. Left  pelvic phleboliths. Atheromatous aorta. Spondylitic changes  in the  lumbar spine.    Regional bones unremarkable.     IMPRESSION:  1. Little change in small bowel dilatation since previous day's  exam.      Electronically Signed    By: Lucrezia Europe M.D.    On: 07/31/2014 09:10     Verified By: Kandis Cocking, M.D.,    16-Mar-16 11:13, Abdomen Flat and Erect  Abdomen Flat and Erect   REASON FOR EXAM:    abdominal distension  COMMENTS:       PROCEDURE: DXR - DXR ABDOMEN 2 V FLAT AND ERECT  - Jul 31 2014 11:13AM     CLINICAL DATA:  Subsequent evaluation abdominal distension shortness  of breath weakness    EXAM:  ABDOMEN -  2 VIEW    COMPARISON:  07/31/2014    FINDINGS:  Mildly dilated loops of small bowel in the central abdomen. There  are air-fluid levels. Abdominal wall staples postsurgical are noted.  There does appear to be some gas into the large bowel.     IMPRESSION:  Persistent small bowel dilatation without significant interval  change.      Electronically Signed    By: Skipper Cliche M.D.    On: 07/31/2014 13:15         Verified By: Rachael Fee, M.D.,    17-Mar-16 08:58, Abdomen Flat and Erect  Abdomen Flat and Erect   REASON FOR EXAM:    ileus  COMMENTS:   Bedside (portable):Y    PROCEDURE: DXR - DXR ABDOMEN 2 V FLAT AND ERECT  - Aug 01 2014  8:58AM     CLINICAL DATA:  Ileus    EXAM:  ABDOMEN - 2 VIEW    COMPARISON:  July 31, 2014    FINDINGS:  Supine and uprightabdomen images were obtained. There is persistent  small bowel dilatation without appreciable air-fluid levels. No free  air. There are skin staples overlying the right lower abdomen and  pelvis. There are clips in the right lower quadrant region.  Nasogastric tube tip and side port are in the stomach.     IMPRESSION:  Persistent bowel dilatation without air-fluid levels. No free air.  The appearance is consistent with a degree of ileus.      Electronically Signed    By: Lowella Grip III M.D.    On: 08/01/2014 09:01         Verified By: Leafy Kindle. WOODRUFF, M.D.,    17-Mar-16 08:58, Chest Portable Single View  Chest Portable Single View   REASON FOR EXAM:    SOB  COMMENTS:       PROCEDURE: DXR - DXR PORTABLE CHEST SINGLE VIEW  - Aug 01 2014  8:58AM     CLINICAL DATA:  Difficulty breathing and wheezing    EXAM:  PORTABLE CHEST - 1 VIEW    COMPARISON:  July 31, 2014    FINDINGS:  Nasogastric tube tip is in the stomach. There are small pleural  effusions bilaterally with bibasilar atelectasis and cardiomegaly.  The pulmonary vascularity is normal. No adenopathy. No bone lesions.      IMPRESSION:  Small pleural effusions and cardiomegaly. Suspect a degree of  congestive heart failure. Stable patchy bibasilar atelectasis. No  appreciable airspace consolidation.      Electronically Signed    By: Lowella Grip III M.D.    On: 08/01/2014 09:02         Verified By: Leafy Kindle. WOODRUFF, M.D.,    17-Mar-16  15:06, Chest Portable Single View  Chest Portable Single View   REASON FOR EXAM:    Picc Line Placement  COMMENTS:       PROCEDURE: DXR - DXR PORTABLE CHEST SINGLE VIEW  - Aug 01 2014  3:06PM     CLINICAL DATA:  PICC placement    EXAM:  PORTABLE CHEST - 1 VIEW    COMPARISON:  Chest x-ray from the same day 8:44 a.m.    FINDINGS:  New right upper extremity PICC, tip at the distal SVC.  Nasogastric tube tip is difficult to visualize compared to imaging  earlier the same day. No indication of displacement    Dilated bowel noted in the epigastric region, known from dedicated  imaging.    A peripheral left base opacity is again noted. Indistinct appearance  is suspicious for pneumonia. There is pleural-based calcification on  the right. Stable heart size and mediastinal contours.     IMPRESSION:  1. A new rightupper extremity PICC is in good position.  2. Persistence of the left basilar opacity, concerning for  pneumonia.  3. Trace pleural effusions and calcified pleural plaques.  Electronically Signed    By: Monte Fantasia M.D.    On: 08/01/2014 15:42         Verified By: Gilford Silvius, M.D.,    19-Mar-16 14:39, Abdomen Flat and Erect  Abdomen Flat and Erect   REASON FOR EXAM:    sbo  COMMENTS:       PROCEDURE: DXR - DXR ABDOMEN 2 V FLAT AND ERECT  - Aug 03 2014  2:39PM     CLINICAL DATA:  colostomy reversal yesterday, abd pain    EXAM:  ABDOMEN - 2 VIEW    COMPARISON:  CT 08/02/2014    FINDINGS:  NG tube extends into the distal stomach versus second portion of the  duodenum. Dilated loops of small bowel up to 3 cm. No  air-fluid  levels. Rectal drain in the pelvis.     IMPRESSION:  1. NG tube likely within the duodenum.  2. Dilated small bowel most consistent with postoperative ileus.      Electronically Signed    By: Suzy Bouchard M.D.    On: 08/03/2014 14:44         Verified By: Rennis Golden, M.D.,  Cardiology:    12-Mar-16 08:23, ECG  Ventricular Rate 110  Atrial Rate 110  P-R Interval 124  QRS Duration 88  QT 318  QTc 430  P Axis 33  R Axis 7  T Axis 29  ECG interpretation   Sinus tachycardia with Premature supraventricular complexes  Otherwise normal ECG  When compared with ECG of 09-May-2014 12:24,  Premature supraventricular complexes are now Present  QRS voltage has decreased  T wave amplitude has decreased in Anterior leads  Confirmed by PARASCHOS, ALEX ([id number]) on 07/29/2014 4:20:35 PM    Overreader: Miquel Dunn  ECG     13-Mar-16 19:43, ECG  Ventricular Rate 153  Atrial Rate 141  QRS Duration 90  QT 284  QTc 453  R Axis 6  T Axis 14  ECG interpretation   Atrial fibrillation with rapid ventricular response  Statement Not Found (#[id number])  Abnormal ECG  When compared with ECG of 27-Jul-2014 08:23,  Atrial fibrillation has replaced Sinus rhythm  ST now depressed in Anterolateral leads  Confirmed by PARASCHOS, ALEX ([id number]) on 07/29/2014 4:20:15 PM    Overreader: PARASCHOS, ALEX  ECG     14-Mar-16 13:19,  Echo Doppler  Echo Doppler   REASON FOR EXAM:      COMMENTS:       PROCEDURE: [facility] - ECHO DOPPLER COMPLETE(TRANSTHOR)  - Jul 29 2014  1:19PM     RESULT: Echocardiogram Report    Patient Name:   JAYVYN HASELTON Date of Exam: 07/29/2014  Medical Rec #:  [id number]          Custom1:  Date of Birth:  [birth year]       Height:       73.0 in  Patient Age:    81 years        Weight:       160.0 lb  Patient Gender: M               BSA:          1.96 m??    Indications: Atrial Fib  Sonographer:    Sherrie Sport RDCS  Referring Phys: Marlyce Huge,  A    Sonographer Comments: Technically challenging study due to less than   ideal echo windows and Due to rapid heart rate.    Summary:   1. Left ventricular ejection fraction, by visual estimation, is 40 to   45%.   2. Mildly decreased global left ventricular systolic function.   3. Mildly dilated left atrium.   4. Mildly dilated right atrium.   5. Mild to moderate mitral valve regurgitation.   6. Mild to moderate tricuspid regurgitation.  2D AND M-MODE MEASUREMENTS (normal ranges within parentheses):  Left Ventricle:          Normal  IVSd (2D):      1.05 cm (0.7-1.1)  LVPWd (2D):     0.79 cm (0.7-1.1) Aorta/LA:                  Normal  LVIDd (2D):     4.54 cm (3.4-5.7) Aortic Root (2D): 3.90 cm (2.4-3.7)  LVIDs (2D):     3.95 cm         Left Atrium (2D): 5.50 cm (1.9-4.0)  LV FS (2D):     13.0 %   (>25%)  LV EF (2D):     28.0 %   (>50%)                                    Right Ventricle:                                    RVd (2D):        9.81 cm  LV DIASTOLIC FUNCTION:  MVPeak E: 0.87 m/s E/e' Ratio: 8.90                      Decel Time: 185 msec  SPECTRAL DOPPLER ANALYSIS (where applicable):  Mitral Valve:  MV P1/2 Time: 53.65 msec  MV Area, PHT: 4.10 cm??  Aortic Valve: AoV Max Vel: 1.17 m/s AoV Peak PG: 5.4 mmHg AoVMean PG:   3.7 mmHg  LVOT Vmax: 0.52 m/s LVOT VTI: 0.090 m LVOT Diameter: 2.20 cm  AoV Area, Vmax: 1.69 cm?? AoV Area, VTI: 1.84 cm?? AoV Area, Vmn: 1.53 cm??  Tricuspid Valve and PA/RV Systolic Pressure: TR Max Velocity: 2.76 m/s RA   Pressure: 5 mmHg RVSP/PASP: 35.5 mmHg  Pulmonic Valve:  PV Max Velocity: 0.79 m/s PV Max  PG: 2.5 mmHg PV Mean PG:    PHYSICIAN INTERPRETATION:  Left Ventricle: The left ventricular internal cavity size was normal. LV   posterior wall thickness was normal. No left ventricular hypertrophy.   Global LV systolic function was mildly decreased. Left ventricular   ejection fraction, by visual estimation, is 40 to 45%. Spectral  Doppler   shows normal pattern of LV diastolic filling.  Right Ventricle: The right ventricularsize is normal.  Left Atrium: The left atrium is mildly dilated.  Right Atrium: The right atrium is mildly dilated.  Pericardium: There is no evidence of pericardial effusion.  Mitral Valve: Mild to moderate mitral valve regurgitation is seen.  Tricuspid Valve: Mild to moderate tricuspid regurgitation is visualized.   The tricuspid regurgitant velocity is 2.76 m/s, and with an assumed right   atrial pressure of 5 mmHg, the estimated right ventricular systolic   pressure is normal at 35.5 mmHg.  Aortic Valve: The aortic valve is normal.  Aorta: The aortic root is normal in size and structure.    [facility] MD  Electronically signed by [id number] Isaias Cowman MD  Signature Date/Time: 07/29/2014/4:14:35 PM  *** Final ***    IMPRESSION: .        Verified BySheppard Coil . PARASCHOS, M.D., MD    16-Mar-16 08:52, ECG  Ventricular Rate 83  Atrial Rate 83  P-R Interval 126  QRS Duration 106  QT 392  QTc 460  P Axis 49  R Axis 14  T Axis 38  ECG interpretation   Sinus rhythm with marked sinus arrhythmia  Otherwise normal ECG  When compared with ECG of 28-Jul-2014 19:43,  Sinus rhythm has replaced Atrial fibrillation  Vent. rate has decreased BY  70 BPM  ST no longer depressed in Lateral leads  Confirmed by PARASCHOS, ALEX ([id number]) on 07/31/2014 5:28:32 PM    Overreader: PARASCHOS, ALEX  ECG   CT:    18-Mar-16 09:10, CT Abdomen and Pelvis Without Contrast  CT Abdomen and Pelvis Without Contrast   REASON FOR EXAM:    (1) distension POD 8; (2) distension POD 8  COMMENTS:       PROCEDURE: CT  - CT ABDOMEN AND PELVIS W0  - Aug 02 2014  9:10AM     CLINICAL DATA:  Postop day 8 laparotomy. Distended abdomen. Colon  carcinoma    EXAM:  CT ABDOMEN ANDPELVIS WITHOUT CONTRAST    TECHNIQUE:  Multidetector CT imaging of the abdomen and pelvis was performed  following the  standard protocol without IV contrast.  COMPARISON:  CT 04/24/2014    FINDINGS:  Small to moderate bilateral pleural effusions withcompressive  atelectasis in the lung bases. Calcified pleural plaques bilaterally  consistent with prior asbestos exposure. Cardiac enlargement.  Coronary calcification.    Hepatic cysts as noted on the prior CT. No new hepatic lesion.  Spleen is normal in size. Pancreas shows no edema or mass.  Gallbladder has contrast related to prior intravenous contrast  administration.    Negative for renal mass or obstruction. Left renal cyst is  unchanged. No renal calculi. Urinary bladder contains a Foley  catheter without abnormality.    Abdominal aortic aneurysm unchanged measuring 4.9 x 5.5 cm. No  evidence of aneurysm leak. Atherosclerotic disease throughout the  iliac arteries.    Postop resection of a mass in the right colon. Surgical anastomosis  is present in the right lower quadrant. The terminal ileum is  visualized and is not dilated. There is diffuse small  bowel  dilatation throughout the remainder of the bowel with multiple  air-fluid levels consistent with small bowel obstruction. Note is  made of a right inguinal hernia which was present previously.  Dilated small bowel extends into the hernia and the hernia may be  the cause of small bowel obstruction. In addition, there are  anastomotic clips of the small bowel within the hernia. Left  inguinal hernia also present not containing bowel but fluid. Colon  is decompressed.    Small amount of free fluid in the abdomen consistent with recent  surgery. Fluid collection in the anterior abdominal wall measures  approximately 15 x 9cm compatible with postop seroma. Infection not  excluded and correlation with physical examination recommended.     IMPRESSION:  Postop resection of a mass in the right colon.    Small bowel dilatation with air-fluid levels consistent with small  bowel obstruction.  Bowel is dilated down to the region of  anastomosis in the small bowel. This is in the area of a right  inguinal hernia and the hernia could be causing obstruction. Colon  decompressed.    Small amount of free fluid in the abdomen.    Postop seroma in the anterior abdominal wall. Correlate clinically  for any signs of infection in the anterior abdominal wall.    Abdominal aortic aneurysm measuring 4.9 x 5.5 cm unchanged from the  prior study. Recommend followup by abdomen and pelvis CTA in 3-6  months, and vascular surgery referral/consultation if not already  obtained. This recommendation follows ACR consensus guidelines:  White Paper of the ACR Incidental Findings Committee II on Vascular  Findings. J Am Coll Radiol 2013; 10:789-794.    Electronically Signed    By: Franchot Gallo M.D.    On: 08/02/2014 09:43         Verified By: Truett Perna, M.D.,   Assessment/Plan:  Invasive Device Daily Assessment of Necessity:  Does the patient currently have any of the following indwelling devices? foley  central line catheter   Indwelling Urinary Catheter continued, requirement due to   Reason to continue Indwelling Urinary Catheter for strict Intake/Output monitoring for hemodynamic instability   Central Line continued, requirement due to   Reason to continue Central Line When IV access will be required for less than 8 weeks   Assessment/Plan:  Assessment IMP  postop from surgery  weakness  atrial fibrillation on amiodarone  abdominal pain with obstruction  dehydration  weakness  confusion  anemia .   Plan recommend rehab  agree with surgical input therapy for abdominal pain  switch amiodarone to IV  use metoprolol IV intermittently for tachycardia  do not recommend anticoagulation   continue DVT prophylaxis  follow-up H&H for anemia  conservative cardiology care at this point  agree with palliative care   Electronic Signatures: Yolonda Kida (MD)  (Signed  21-Mar-16 10:27)  Authored: Chief Complaint, VITAL SIGNS/ANCILLARY NOTES, Brief Assessment, Lab Results, Radiology Results, Assessment/Plan   Last Updated: 21-Mar-16 10:27 by Lujean Amel D (MD)

## 2014-09-15 NOTE — Consult Note (Signed)
Chief Complaint:  Subjective/Chief Complaint Resting in bad slightly confused did not wears hearing aids does not appear to be in any pain denies any palpitations   VITAL SIGNS/ANCILLARY NOTES: **Vital Signs.:   19-Mar-16 09:16  Vital Signs Type Q 4hr  Temperature Temperature (F) 97.6  Celsius 36.4  Temperature Source oral  Pulse Pulse 71  Respirations Respirations 18  Systolic BP Systolic BP 638  Diastolic BP (mmHg) Diastolic BP (mmHg) 57  Mean BP 72  Pulse Ox % Pulse Ox % 93  Pulse Ox Activity Level  At rest  Oxygen Delivery 3L  *Intake and Output.:   Daily 19-Mar-16 07:00  Grand Totals Intake:  2433 Output:  2775    Net:  -342 24 Hr.:  -342  Oral Intake      In:  0  TPN cc     In:  886  Other Intake cc     In:  40  Urine ml     Out:  975  Other Output ml     Out:  1800  Length of Stay Totals Intake:  18009.8 Output:  10425    Net:  7584.8   Brief Assessment:  GEN well developed, well nourished, no acute distress, thin   Cardiac Regular  murmur present   Respiratory normal resp effort  clear BS   Gastrointestinal Normal   Gastrointestinal details normal Soft   EXTR negative cyanosis/clubbing, negative edema   Lab Results: Routine Chem:  19-Mar-16 06:26   BUN 19 (6-20 NOTE: New Reference Range  07/09/14)  Creatinine (comp) 0.92 (0.61-1.24 NOTE: New Reference Range  07/09/14)  Sodium, Serum 143 (135-145 NOTE: New Reference Range  07/09/14)  Potassium, Serum 3.5 (3.5-5.1 NOTE: New Reference Range  07/09/14)  Chloride, Serum 109 (101-111 NOTE: New Reference Range  07/09/14)  CO2, Serum 32 (22-32 NOTE: New Reference Range  07/09/14)  Calcium (Total), Serum  7.8 (8.9-10.3 NOTE: New Reference Range  07/09/14)  Anion Gap  2  eGFR (African American) >60  eGFR (Non-African American) >60 (eGFR values <64m/min/1.73 m2 may be an indication of chronic kidney disease (CKD). Calculated eGFR is useful in patients with stable renal function. The eGFR  calculation will not be reliable in acutely ill patients when serum creatinine is changing rapidly. It is not useful in patients on dialysis. The eGFR calculation may not be applicable to patients at the low and high extremes of body sizes, pregnant women, and vegetarians.)  Result Comment LABS - This specimen was collected through an   - indwelling catheter or arterial line.  - A minimum of 543m of blood was wasted prior    - to collecting the sample.  Interpret  - results with caution.  Result(s) reported on 03 Aug 2014 at 06:52AM.  Magnesium, Serum 2.0 (1.7-2.4 THERAPEUTIC RANGE: 4-7 mg/dL TOXIC: > 10 mg/dL  ----------------------- NOTE: New Reference Range  07/09/14)  Phosphorus, Serum 3.4 (2.5-4.6 NOTE: New Reference Range  07/09/14)  Routine Hem:  19-Mar-16 06:26   WBC (CBC) 7.9  RBC (CBC)  4.06  Hemoglobin (CBC)  10.5  Hematocrit (CBC)  33.6  Platelet Count (CBC) 244  MCV 83  MCH  25.8  MCHC  31.1  RDW  19.0  Neutrophil % 69.0  Lymphocyte % 7.7  Monocyte % 21.0  Eosinophil % 2.0  Basophil % 0.3  Neutrophil # 5.4  Lymphocyte #  0.6  Monocyte #  1.7  Eosinophil # 0.2  Basophil # 0.0 (Result(s) reported on 03 Aug 2014  at 06:45AM.)   Radiology Results: XRay:    13-Mar-16 08:32, Abdomen AP Only  Abdomen AP Only   REASON FOR EXAM:    abd distention  COMMENTS:   Bedside (portable):Y    PROCEDURE: DXR - DXR ABDOMEN AP ONLY  - Jul 28 2014  8:32AM     CLINICAL DATA:  New onset abdominal distention. History of colon  cancer.    EXAM:  ABDOMEN - 1 VIEW    COMPARISON:  Were 05/14/2014    FINDINGS:  Dilated loops of small bowel are identified. There is a paucity of  colonic gas. No free intraperitoneal air identified. Ventral skin  staples noted.     IMPRESSION:  1. Dilated loops of small bowel which may reflect small-bowel  obstruction or postoperative ileus.      Electronically Signed    By: Kerby Moors M.D.    On: 07/28/2014 08:48          Verified By: Angelita Ingles, M.D.,    14-Mar-16 08:01, Chest Portable Single View  Chest Portable Single View   REASON FOR EXAM:    CHF  COMMENTS:       PROCEDURE: DXR - DXR PORTABLE CHEST SINGLE VIEW  - Jul 29 2014  8:01AM     CLINICAL DATA:  Increased dyspnea overnight, history of CHF, status  post colostomy reversal 4 days ago    EXAM:  PORTABLE CHEST - 1 VIEW    COMPARISON:  CT scan of the chest of April 19, 2014    FINDINGS:  There are small bilateral pleural effusions. The lungs are  hypoinflated. There is no alveolar infiltrate. The cardiac  silhouette is top-normal in size. The pulmonary vascularity is  mildly prominent centrally but no definite cephalization is  observed. There are loops of mildly distended small bowel in the  upper abdomen.     IMPRESSION:  1. There are new small bilateral pleural effusions with mild  bibasilar atelectasis.Low-grade pulmonary interstitial edema is  suspected as well.  2. Distended gas-filled small bowel loops in the upper abdomen.      Electronically Signed    By: David  Martinique    On: 07/29/2014 08:04         Verified By: DAVID A. Martinique, M.D., MD    14-Mar-16 08:01, KUB - Kidney Ureter Bladder  KUB - Kidney Ureter Bladder   REASON FOR EXAM:    ileus  COMMENTS:   Bedside (portable):Y    PROCEDURE: DXR - DXR KIDNEY URETER BLADDER  - Jul 29 2014  8:01AM     CLINICAL DATA:  Status post colostomy reversal 4 days ago, follow-up  of ileus -small bowel obstruction.    EXAM:  ABDOMEN - 1 VIEW    COMPARISON:  Abdominal series of December 28, 2014    FINDINGS:  There are persistent mildly distended small bowel loops throughout  the abdomen. No significant colonic gas is demonstrated. There is a  tiny amount of gas in the rectum.No free extraluminal gas  collections are demonstrated. There is are stable metallic densities  projecting over the right flank and right iliac crest. Midline lower  abdominal and pelvic  skin staples are present. There are phleboliths  within the pelvis and calcifications in the region of the pancreatic  body.     IMPRESSION:  Stable bowel gas pattern consistent with a distal small bowel  obstruction or ileus.      Electronically Signed    By: David  Martinique  On: 07/29/2014 08:08         Verified By: DAVID A. Martinique, M.D., MD    15-Mar-16 07:50, Chest Portable Single View  Chest Portable Single View   REASON FOR EXAM:    SOB  COMMENTS:       PROCEDURE: DXR - DXR PORTABLE CHEST SINGLE VIEW  - Jul 30 2014  7:50AM     CLINICAL DATA:  Short of breath    EXAM:  PORTABLE CHEST - 1 VIEW    COMPARISON:  07/29/2014    FINDINGS:  No significant change bibasilar airspace disease left greater than  right with small bilateral effusions. Negative for vascular  congestion or heart failure. Decreased lung volume also stable.     IMPRESSION:  Bibasilar infiltrates and small effusions unchanged, probable  pneumonia.      Electronically Signed    By: Franchot Gallo M.D.    On: 07/30/2014 08:21         Verified By: Truett Perna, M.D.,    15-Mar-16 07:50, KUB - Kidney Ureter Bladder  KUB - Kidney Ureter Bladder   REASON FOR EXAM:    ileus  COMMENTS:   Bedside (portable):Y    PROCEDURE: DXR - DXR KIDNEY URETER BLADDER  - Jul 30 2014  7:50AM     CLINICAL DATA:  Status post colostomy were worse reversal on July 25, 2014, follow-up of ileus- small bowel obstruction.    EXAM:  ABDOMEN - 1 VIEW    COMPARISON:  Abdominal examinations of March 13th and July 29, 2014    FINDINGS:  There remain loops of mildly distended gas-filled small bowel  throughout the abdomen. There is no significant colonic gas. No free  extraluminal gas collections are demonstrated. There are  degenerative changes of the lumbar spine. There are midline skin  staples over the pelvis.     IMPRESSION:  Findings consistent with a persistent distal small bowel  obstruction.  Nasogastric suction may be useful.      Electronically Signed    By: David  Martinique    On: 07/30/2014 08:19       Verified By: DAVID A. Martinique, M.D., MD    16-Mar-16 08:29, Chest Portable Single View  Chest Portable Single View   REASON FOR EXAM:    SOB  COMMENTS:       PROCEDURE: DXR - DXR PORTABLE CHEST SINGLE VIEW  - Jul 31 2014  8:29AM     CLINICAL DATA:  Shortness of breath post colostomy reversal    EXAM:  PORTABLE CHEST - 1 VIEW    COMPARISON:  the previous day's study    FINDINGS:  Small pleural effusions and bibasilar consolidation/atelectasis,  left greater than right, partially improved from previous exam.  Heart size upper limits normal for technique. Atheromatous aorta.  Partially calcified bilateral pleural plaques. No effusion.  No pneumothorax.  Spurring in the mid and lower thoracic spine.     IMPRESSION:  1. Bilateral small pleural effusions and bibasilar  atelectasis/consolidation, slightly improved since previous exam.      Electronically Signed  By: Lucrezia Europe M.D.    On: 07/31/2014 09:17         Verified By: Kandis Cocking, M.D.,    16-Mar-16 08:29, KUB - Kidney Ureter Bladder  KUB - Kidney Ureter Bladder   REASON FOR EXAM:    ileus  COMMENTS:   Bedside (portable):Y    PROCEDURE: DXR - DXR KIDNEY URETER BLADDER  - Jul 31 2014  8:29AM     CLINICAL DATA:  ileus    EXAM:  ABDOMEN - 1 VIEW    COMPARISON:  the previous day's study    FINDINGS:  Midline skin staples. Multiple dilated small bowel loops throughout  the abdomen, similar in number and degree of dilatation since  previous exam. Endoscopic clips in the right lower quadrant. Left  pelvic phleboliths. Atheromatous aorta. Spondylitic changes in the  lumbar spine.    Regional bones unremarkable.     IMPRESSION:  1. Little change in small bowel dilatation since previous day's  exam.      Electronically Signed    By: Lucrezia Europe M.D.    On: 07/31/2014 09:10     Verified  By: Kandis Cocking, M.D.,    16-Mar-16 11:13, Abdomen Flat and Erect  Abdomen Flat and Erect   REASON FOR EXAM:    abdominal distension  COMMENTS:       PROCEDURE: DXR - DXR ABDOMEN 2 V FLAT AND ERECT  - Jul 31 2014 11:13AM     CLINICAL DATA:  Subsequent evaluation abdominal distension shortness  of breath weakness    EXAM:  ABDOMEN - 2 VIEW    COMPARISON:  07/31/2014    FINDINGS:  Mildly dilated loops of small bowel in the central abdomen. There  are air-fluid levels. Abdominal wall staples postsurgical are noted.  There does appear to be some gas into the large bowel.     IMPRESSION:  Persistent small bowel dilatation without significant interval  change.      Electronically Signed    By: Skipper Cliche M.D.    On: 07/31/2014 13:15         Verified By: Rachael Fee, M.D.,    17-Mar-16 08:58, Abdomen Flat and Erect  Abdomen Flat and Erect   REASON FOR EXAM:    ileus  COMMENTS:   Bedside (portable):Y    PROCEDURE: DXR - DXR ABDOMEN 2 V FLAT AND ERECT  - Aug 01 2014  8:58AM     CLINICAL DATA:  Ileus    EXAM:  ABDOMEN - 2 VIEW    COMPARISON:  July 31, 2014    FINDINGS:  Supine and uprightabdomen images were obtained. There is persistent  small bowel dilatation without appreciable air-fluid levels. No free  air. There are skin staples overlying the right lower abdomen and  pelvis. There are clips in the right lower quadrant region.  Nasogastric tube tip and side port are in the stomach.     IMPRESSION:  Persistent bowel dilatation without air-fluid levels. No free air.  The appearance is consistent with a degree of ileus.      Electronically Signed    By: Lowella Grip III M.D.    On: 08/01/2014 09:01         Verified By: Leafy Kindle. WOODRUFF, M.D.,    17-Mar-16 08:58, Chest Portable Single View  Chest Portable Single View   REASON FOR EXAM:    SOB  COMMENTS:       PROCEDURE: DXR - DXR PORTABLE CHEST SINGLE VIEW  - Aug 01 2014  8:58AM      CLINICAL DATA:  Difficulty breathing and wheezing    EXAM:  PORTABLE CHEST - 1 VIEW    COMPARISON:  July 31, 2014    FINDINGS:  Nasogastric tube tip is in the stomach. There are small pleural  effusions bilaterally with bibasilar atelectasis and cardiomegaly.  The pulmonary vascularity is normal. No  adenopathy. No bone lesions.     IMPRESSION:  Small pleural effusions and cardiomegaly. Suspect a degree of  congestive heart failure. Stable patchy bibasilar atelectasis. No  appreciable airspace consolidation.      Electronically Signed    By: Lowella Grip III M.D.    On: 08/01/2014 09:02         Verified By: Leafy Kindle. WOODRUFF, M.D.,    17-Mar-16 15:06, Chest Portable Single View  Chest Portable Single View   REASON FOR EXAM:    Picc Line Placement  COMMENTS:       PROCEDURE: DXR - DXR PORTABLE CHEST SINGLE VIEW  - Aug 01 2014  3:06PM     CLINICAL DATA:  PICC placement    EXAM:  PORTABLE CHEST - 1 VIEW    COMPARISON:  Chest x-ray from the same day 8:44 a.m.    FINDINGS:  New right upper extremity PICC, tip at the distal SVC.  Nasogastric tube tip is difficult to visualize compared to imaging  earlier the same day. No indication of displacement    Dilated bowel noted in the epigastric region, known from dedicated  imaging.    A peripheral left base opacity is again noted. Indistinct appearance  is suspicious for pneumonia. There is pleural-based calcification on  the right. Stable heart size and mediastinal contours.     IMPRESSION:  1. A new rightupper extremity PICC is in good position.  2. Persistence of the left basilar opacity, concerning for  pneumonia.  3. Trace pleural effusions and calcified pleural plaques.  Electronically Signed    By: Monte Fantasia M.D.    On: 08/01/2014 15:42         Verified By: Gilford Silvius, M.D.,  Cardiology:    12-Mar-16 08:23, ECG  Ventricular Rate 110  Atrial Rate 110  P-R Interval 124  QRS  Duration 88  QT 318  QTc 430  P Axis 33  R Axis 7  T Axis 29  ECG interpretation   Sinus tachycardia with Premature supraventricular complexes  Otherwise normal ECG  When compared with ECG of 09-May-2014 12:24,  Premature supraventricular complexes are now Present  QRS voltage has decreased  T wave amplitude has decreased in Anterior leads  Confirmed by PARASCHOS, ALEX ([id number]) on 07/29/2014 4:20:35 PM    Overreader: Miquel Dunn  ECG     13-Mar-16 19:43, ECG  Ventricular Rate 153  Atrial Rate 141  QRS Duration 90  QT 284  QTc 453  R Axis 6  T Axis 14  ECG interpretation   Atrial fibrillation with rapid ventricular response  Statement Not Found (#1024)  Abnormal ECG  When compared with ECG of 27-Jul-2014 08:23,  Atrial fibrillation has replaced Sinus rhythm  ST now depressed in Anterolateral leads  Confirmed by PARASCHOS, ALEX ([id number]) on 07/29/2014 4:20:15 PM    Overreader: PARASCHOS, ALEX  ECG     14-Mar-16 13:19, Echo Doppler  Echo Doppler   REASON FOR EXAM:      COMMENTS:       PROCEDURE: ECH - ECHO DOPPLER COMPLETE(TRANSTHOR)  - Jul 29 2014  1:19PM     RESULT: Echocardiogram Report    Patient Name:   Douglas Ramsey Date of Exam: 07/29/2014  Medical Rec #:  [id number]          Custom1:  Date of Birth:  11/15/1918       Height:       73.0 in  Patient Age:  81 years        Weight:       160.0 lb  Patient Gender: M               BSA:          1.96 m??    Indications: Atrial Fib  Sonographer:    Sherrie Sport RDCS  Referring Phys: Marlyce Huge, A    Sonographer Comments: Technically challenging study due to less than   ideal echo windows and Due to rapid heart rate.    Summary:   1. Left ventricular ejection fraction, by visual estimation, is 40 to   45%.   2. Mildly decreased global left ventricular systolic function.   3. Mildly dilated left atrium.   4. Mildly dilated right atrium.   5. Mild to moderate mitral valve regurgitation.   6. Mild to  moderate tricuspid regurgitation.  2D AND M-MODE MEASUREMENTS (normal ranges within parentheses):  Left Ventricle:          Normal  IVSd (2D):      1.05 cm (0.7-1.1)  LVPWd (2D):     0.79 cm (0.7-1.1) Aorta/LA:                  Normal  LVIDd (2D):     4.54 cm (3.4-5.7) Aortic Root (2D): 3.90 cm (2.4-3.7)  LVIDs (2D):     3.95 cm         Left Atrium (2D): 5.50 cm (1.9-4.0)  LV FS (2D):     13.0 %   (>25%)  LV EF (2D):     28.0 %   (>50%)                                    Right Ventricle:                                    RVd (2D):        2.80 cm  LV DIASTOLIC FUNCTION:  MVPeak E: 0.87 m/s E/e' Ratio: 8.90                      Decel Time: 185 msec  SPECTRAL DOPPLER ANALYSIS (where applicable):  Mitral Valve:  MV P1/2 Time: 53.65 msec  MV Area, PHT: 4.10 cm??  Aortic Valve: AoV Max Vel: 1.17 m/s AoV Peak PG: 5.4 mmHg AoVMean PG:   3.7 mmHg  LVOT Vmax: 0.52 m/s LVOT VTI: 0.090 m LVOT Diameter: 2.20 cm  AoV Area, Vmax: 1.69 cm?? AoV Area, VTI: 1.84 cm?? AoV Area, Vmn: 1.53 cm??  Tricuspid Valve and PA/RV Systolic Pressure: TR Max Velocity: 2.76 m/s RA   Pressure: 5 mmHg RVSP/PASP: 35.5 mmHg  Pulmonic Valve:  PV Max Velocity: 0.79 m/s PV Max PG: 2.5 mmHg PV Mean PG:    PHYSICIAN INTERPRETATION:  Left Ventricle: The left ventricular internal cavity size was normal. LV   posterior wall thickness was normal. No left ventricular hypertrophy.   Global LV systolic function was mildly decreased. Left ventricular   ejection fraction, by visual estimation, is 40 to 45%. Spectral Doppler   shows normal pattern of LV diastolic filling.  Right Ventricle: The right ventricularsize is normal.  Left Atrium: The left atrium is mildly dilated.  Right Atrium: The right atrium is mildly dilated.  Pericardium: There is no evidence  of pericardial effusion.  Mitral Valve: Mild to moderate mitral valve regurgitation is seen.  Tricuspid Valve: Mild to moderate tricuspid regurgitation is visualized.   The  tricuspid regurgitant velocity is 2.76 m/s, and with an assumed right   atrial pressure of 5 mmHg, the estimated right ventricular systolic   pressure is normal at 35.5 mmHg.  Aortic Valve: The aortic valve is normal.  Aorta: The aortic root is normal in size and structure.    Dodson MD  Electronically signed by [id number] Isaias Cowman MD  Signature Date/Time: 07/29/2014/4:14:35 PM  *** Final ***    IMPRESSION: .        Verified BySheppard Coil . PARASCHOS, M.D., MD    16-Mar-16 08:52, ECG  Ventricular Rate 83  Atrial Rate 83  P-R Interval 126  QRS Duration 106  QT 392  QTc 460  P Axis 49  R Axis 14  T Axis 38  ECG interpretation   Sinus rhythm with marked sinus arrhythmia  Otherwise normal ECG  When compared with ECG of 28-Jul-2014 19:43,  Sinus rhythm has replaced Atrial fibrillation  Vent. rate has decreased BY  70 BPM  ST no longer depressed in Lateral leads  Confirmed by PARASCHOS, ALEX ([id number]) on 07/31/2014 5:28:32 PM    Overreader: PARASCHOS, ALEX  ECG   CT:    18-Mar-16 09:10, CT Abdomen and Pelvis Without Contrast  CT Abdomen and Pelvis Without Contrast   REASON FOR EXAM:    (1) distension POD 8; (2) distension POD 8  COMMENTS:       PROCEDURE: CT  - CT ABDOMEN AND PELVIS W0  - Aug 02 2014  9:10AM     CLINICAL DATA:  Postop day 8 laparotomy. Distended abdomen. Colon  carcinoma    EXAM:  CT ABDOMEN ANDPELVIS WITHOUT CONTRAST    TECHNIQUE:  Multidetector CT imaging of the abdomen and pelvis was performed  following the standard protocol without IV contrast.  COMPARISON:  CT 04/24/2014    FINDINGS:  Small to moderate bilateral pleural effusions withcompressive  atelectasis in the lung bases. Calcified pleural plaques bilaterally  consistent with prior asbestos exposure. Cardiac enlargement.  Coronary calcification.    Hepatic cysts as noted on the prior CT. No new hepatic lesion.  Spleen is normal in size. Pancreas shows no edema or  mass.  Gallbladder has contrast related to prior intravenous contrast  administration.    Negative for renal mass or obstruction. Left renal cyst is  unchanged. No renal calculi. Urinary bladder contains a Foley  catheter without abnormality.    Abdominal aortic aneurysm unchanged measuring 4.9 x 5.5 cm. No  evidence of aneurysm leak. Atherosclerotic disease throughout the  iliac arteries.    Postop resection of a mass in the right colon. Surgical anastomosis  is present in the right lower quadrant. The terminal ileum is  visualized and is not dilated. There is diffuse small bowel  dilatation throughout the remainder of the bowel with multiple  air-fluid levels consistent with small bowel obstruction. Note is  made of a right inguinal hernia which was present previously.  Dilated small bowel extends into the hernia and the hernia may be  the cause of small bowel obstruction. In addition, there are  anastomotic clips of the small bowel within the hernia. Left  inguinal hernia also present not containing bowel but fluid. Colon  is decompressed.    Small amount of free fluid in the abdomen consistent with recent  surgery. Fluid collection  in the anterior abdominal wall measures  approximately 15 x 9cm compatible with postop seroma. Infection not  excluded and correlation with physical examination recommended.     IMPRESSION:  Postop resection of a mass in the right colon.    Small bowel dilatation with air-fluid levels consistent with small  bowel obstruction. Bowel is dilated down to the region of  anastomosis in the small bowel. This is in the area of a right  inguinal hernia and the hernia could be causing obstruction. Colon  decompressed.    Small amount of free fluid in the abdomen.    Postop seroma in the anterior abdominal wall. Correlate clinically  for any signs of infection in the anterior abdominal wall.    Abdominal aortic aneurysm measuring 4.9 x 5.5 cm unchanged  from the  prior study. Recommend followup by abdomen and pelvis CTA in 3-6  months, and vascular surgery referral/consultation if not already  obtained. This recommendation follows ACR consensus guidelines:  White Paper of the ACR Incidental Findings Committee II on Vascular  Findings. J Am Coll Radiol 2013; 10:789-794.    Electronically Signed    By: Franchot Gallo M.D.    On: 08/02/2014 09:43         Verified By: Truett Perna, M.D.,   Assessment/Plan:  Invasive Device Daily Assessment of Necessity:  Does the patient currently have any of the following indwelling devices? foley  central line catheter   Indwelling Urinary Catheter continued, requirement due to   Reason to continue Indwelling Urinary Catheter for strict Intake/Output monitoring for hemodynamic instability   Central Line continued, requirement due to   Reason to continue Central Line When IV access will be required for less than 8 weeks   Assessment/Plan:  Assessment IMP  atrial fibrillation on amiodarone  abdominal pain with obstruction  dehydration  weakness  confusion  anemia .   Plan continue NPO  agree with surgical input therapy for abdominal pain  switch amiodarone to IV  use metoprolol IV intermittently for tachycardia  do not recommend anticoagulation   continue DVT prophylaxis  follow-up H&H for anemia  conservative cardiology care at this point  agree with palliative care   Electronic Signatures: Yolonda Kida (MD)  (Signed 19-Mar-16 13:52)  Authored: Chief Complaint, VITAL SIGNS/ANCILLARY NOTES, Brief Assessment, Lab Results, Radiology Results, Assessment/Plan   Last Updated: 19-Mar-16 13:52 by Yolonda Kida (MD)

## 2014-09-15 NOTE — Consult Note (Signed)
Chief Complaint:  Subjective/Chief Complaint Still  awake alert slightly disoriented denies any pain or palpitation   VITAL SIGNS/ANCILLARY NOTES: **Vital Signs.:   20-Mar-16 12:03  Vital Signs Type Routine  Temperature Temperature (F) 97.6  Celsius 36.4  Pulse Pulse 70  Respirations Respirations 20  Systolic BP Systolic BP 827  Diastolic BP (mmHg) Diastolic BP (mmHg) 69  Mean BP 87  Pulse Ox % Pulse Ox % 94  Pulse Ox Activity Level  At rest  Oxygen Delivery 3L  *Intake and Output.:   Daily 20-Mar-16 07:00  Grand Totals Intake:  2318.2 Output:  3225    Net:  -906.8 24 Hr.:  -906.8  Oral Intake      In:  0  IV (Primary)      In:  326.2  TPN cc     In:  1952  Other Intake cc     In:  40  Urine ml     Out:  1650  Hemovac ml     Out:  750  Other Output ml     Out:  825  Length of Stay Totals Intake:  20328 Output:  13650    Net:  6678   Brief Assessment:  GEN well developed, well nourished, no acute distress, thin   Cardiac Regular  murmur present   Respiratory normal resp effort  clear BS   Gastrointestinal Normal   Gastrointestinal details normal Soft   EXTR negative cyanosis/clubbing, negative edema   Lab Results: Routine Chem:  20-Mar-16 04:10   Glucose, Serum  125 (65-99 NOTE: New Reference Range  07/09/14)  BUN 19 (6-20 NOTE: New Reference Range  07/09/14)  Creatinine (comp) 0.81 (0.61-1.24 NOTE: New Reference Range  07/09/14)  Sodium, Serum 142 (135-145 NOTE: New Reference Range  07/09/14)  Potassium, Serum  3.4 (3.5-5.1 NOTE: New Reference Range  07/09/14)  Chloride, Serum 108 (101-111 NOTE: New Reference Range  07/09/14)  CO2, Serum  33 (22-32 NOTE: New Reference Range  07/09/14)  Calcium (Total), Serum  7.6 (8.9-10.3 NOTE: New Reference Range  07/09/14)  Anion Gap  1  eGFR (African American) >60  eGFR (Non-African American) >60 (eGFR values <45m/min/1.73 m2 may be an indication of chronic kidney disease (CKD). Calculated eGFR  is useful in patients with stable renal function. The eGFR calculation will not be reliable in acutely ill patients when serum creatinine is changing rapidly. It is not useful in patients on dialysis. The eGFR calculation may not be applicable to patients at the low and high extremes of body sizes, pregnant women, and vegetarians.)  Result Comment LABS - This specimen was collected through an   - indwelling catheter or arterial line.  - A minimum of 512m of blood was wasted prior    - to collecting the sample.  Interpret  - results with caution.  Result(s) reported on 04 Aug 2014 at 04:47AM.  Magnesium, Serum 2.0 (1.7-2.4 THERAPEUTIC RANGE: 4-7 mg/dL TOXIC: > 10 mg/dL  ----------------------- NOTE: New Reference Range  07/09/14)  Phosphorus, Serum  2.4 (2.5-4.6 NOTE: New Reference Range  07/09/14)   Radiology Results: XRay:    13-Mar-16 08:32, Abdomen AP Only  Abdomen AP Only   REASON FOR EXAM:    abd distention  COMMENTS:   Bedside (portable):Y    PROCEDURE: DXR - DXR ABDOMEN AP ONLY  - Jul 28 2014  8:32AM     CLINICAL DATA:  New onset abdominal distention. History of colon  cancer.    EXAM:  ABDOMEN -  1 VIEW    COMPARISON:  Were 05/14/2014    FINDINGS:  Dilated loops of small bowel are identified. There is a paucity of  colonic gas. No free intraperitoneal air identified. Ventral skin  staples noted.     IMPRESSION:  1. Dilated loops of small bowel which may reflect small-bowel  obstruction or postoperative ileus.      Electronically Signed    By: Kerby Moors M.D.    On: 07/28/2014 08:48         Verified By: Angelita Ingles, M.D.,    14-Mar-16 08:01, Chest Portable Single View  Chest Portable Single View   REASON FOR EXAM:    CHF  COMMENTS:       PROCEDURE: DXR - DXR PORTABLE CHEST SINGLE VIEW  - Jul 29 2014  8:01AM     CLINICAL DATA:  Increased dyspnea overnight, history of CHF, status  post colostomy reversal 4 days ago    EXAM:  PORTABLE  CHEST - 1 VIEW    COMPARISON:  CT scan of the chest of April 19, 2014    FINDINGS:  There are small bilateral pleural effusions. The lungs are  hypoinflated. There is no alveolar infiltrate. The cardiac  silhouette is top-normal in size. The pulmonary vascularity is  mildly prominent centrally but no definite cephalization is  observed. There are loops of mildly distended small bowel in the  upper abdomen.     IMPRESSION:  1. There are new small bilateral pleural effusions with mild  bibasilar atelectasis.Low-grade pulmonary interstitial edema is  suspected as well.  2. Distended gas-filled small bowel loops in the upper abdomen.      Electronically Signed    By: David  Martinique    On: 07/29/2014 08:04         Verified By: DAVID A. Martinique, M.D., MD    14-Mar-16 08:01, KUB - Kidney Ureter Bladder  KUB - Kidney Ureter Bladder   REASON FOR EXAM:    ileus  COMMENTS:   Bedside (portable):Y    PROCEDURE: DXR - DXR KIDNEY URETER BLADDER  - Jul 29 2014  8:01AM     CLINICAL DATA:  Status post colostomy reversal 4 days ago, follow-up  of ileus -small bowel obstruction.    EXAM:  ABDOMEN - 1 VIEW    COMPARISON:  Abdominal series of December 28, 2014    FINDINGS:  There are persistent mildly distended small bowel loops throughout  the abdomen. No significant colonic gas is demonstrated. There is a  tiny amount of gas in the rectum.No free extraluminal gas  collections are demonstrated. There is are stable metallic densities  projecting over the right flank and right iliac crest. Midline lower  abdominal and pelvic skin staples are present. There are phleboliths  within the pelvis and calcifications in the region of the pancreatic  body.     IMPRESSION:  Stable bowel gas pattern consistent with a distal small bowel  obstruction or ileus.      Electronically Signed    By: David  Martinique    On: 07/29/2014 08:08         Verified By: DAVID A. [place]ique, M.D., MD    15-Mar-16  07:50, Chest Portable Single View  Chest Portable Single View   REASON FOR EXAM:    SOB  COMMENTS:       PROCEDURE: DXR - DXR PORTABLE CHEST SINGLE VIEW  - Jul 30 2014  7:50AM     CLINICAL DATA:  Short  of breath    EXAM:  PORTABLE CHEST - 1 VIEW    COMPARISON:  07/29/2014    FINDINGS:  No significant change bibasilar airspace disease left greater than  right with small bilateral effusions. Negative for vascular  congestion or heart failure. Decreased lung volume also stable.     IMPRESSION:  Bibasilar infiltrates and small effusions unchanged, probable  pneumonia.      Electronically Signed    By: Franchot Gallo M.D.    On: 07/30/2014 08:21         Verified By: Truett Perna, M.D.,    15-Mar-16 07:50, KUB - Kidney Ureter Bladder  KUB - Kidney Ureter Bladder   REASON FOR EXAM:    ileus  COMMENTS:   Bedside (portable):Y    PROCEDURE: DXR - DXR KIDNEY URETER BLADDER  - Jul 30 2014  7:50AM     CLINICAL DATA:  Status post colostomy were worse reversal on July 25, 2014, follow-up of ileus- small bowel obstruction.    EXAM:  ABDOMEN - 1 VIEW    COMPARISON:  Abdominal examinations of March 13th and July 29, 2014    FINDINGS:  There remain loops of mildly distended gas-filled small bowel  throughout the abdomen. There is no significant colonic gas. No free  extraluminal gas collections are demonstrated. There are  degenerative changes of the lumbar spine. There are midline skin  staples over the pelvis.     IMPRESSION:  Findings consistent with a persistent distal small bowel  obstruction. Nasogastric suction may be useful.      Electronically Signed    By: David  Martinique    On: 07/30/2014 08:19       Verified By: DAVID A. [place]ique, M.D., MD    16-Mar-16 08:29, Chest Portable Single View  Chest Portable Single View   REASON FOR EXAM:    SOB  COMMENTS:       PROCEDURE: DXR - DXR PORTABLE CHEST SINGLE VIEW  - Jul 31 2014  8:29AM     CLINICAL DATA:   Shortness of breath post colostomy reversal    EXAM:  PORTABLE CHEST - 1 VIEW    COMPARISON:  the previous day's study    FINDINGS:  Small pleural effusions and bibasilar consolidation/atelectasis,  left greater than right, partially improved from previous exam.  Heart size upper limits normal for technique. Atheromatous aorta.  Partially calcified bilateral pleural plaques. No effusion.  No pneumothorax.  Spurring in the mid and lower thoracic spine.     IMPRESSION:  1. Bilateral small pleural effusions and bibasilar  atelectasis/consolidation, slightly improved since previous exam.      Electronically Signed  By: Lucrezia Europe M.D.    On: 07/31/2014 09:17         Verified By: Kandis Cocking, M.D.,    16-Mar-16 08:29, KUB - Kidney Ureter Bladder  KUB - Kidney Ureter Bladder   REASON FOR EXAM:    ileus  COMMENTS:   Bedside (portable):Y    PROCEDURE: DXR - DXR KIDNEY URETER BLADDER  - Jul 31 2014  8:29AM     CLINICAL DATA:  ileus    EXAM:  ABDOMEN - 1 VIEW    COMPARISON:  the previous day's study    FINDINGS:  Midline skin staples. Multiple dilated small bowel loops throughout  the abdomen, similar in number and degree of dilatation since  previous exam. Endoscopic clips in the right lower quadrant. Left  pelvic phleboliths. Atheromatous aorta. Spondylitic changes  in the  lumbar spine.    Regional bones unremarkable.     IMPRESSION:  1. Little change in small bowel dilatation since previous day's  exam.      Electronically Signed    By: Lucrezia Europe M.D.    On: 07/31/2014 09:10     Verified By: Kandis Cocking, M.D.,    16-Mar-16 11:13, Abdomen Flat and Erect  Abdomen Flat and Erect   REASON FOR EXAM:    abdominal distension  COMMENTS:       PROCEDURE: DXR - DXR ABDOMEN 2 V FLAT AND ERECT  - Jul 31 2014 11:13AM     CLINICAL DATA:  Subsequent evaluation abdominal distension shortness  of breath weakness    EXAM:  ABDOMEN - 2 VIEW    COMPARISON:   07/31/2014    FINDINGS:  Mildly dilated loops of small bowel in the central abdomen. There  are air-fluid levels. Abdominal wall staples postsurgical are noted.  There does appear to be some gas into the large bowel.     IMPRESSION:  Persistent small bowel dilatation without significant interval  change.      Electronically Signed    By: Skipper Cliche M.D.    On: 07/31/2014 13:15         Verified By: Rachael Fee, M.D.,    17-Mar-16 08:58, Abdomen Flat and Erect  Abdomen Flat and Erect   REASON FOR EXAM:    ileus  COMMENTS:   Bedside (portable):Y    PROCEDURE: DXR - DXR ABDOMEN 2 V FLAT AND ERECT  - Aug 01 2014  8:58AM     CLINICAL DATA:  Ileus    EXAM:  ABDOMEN - 2 VIEW    COMPARISON:  July 31, 2014    FINDINGS:  Supine and uprightabdomen images were obtained. There is persistent  small bowel dilatation without appreciable air-fluid levels. No free  air. There are skin staples overlying the right lower abdomen and  pelvis. There are clips in the right lower quadrant region.  Nasogastric tube tip and side port are in the stomach.     IMPRESSION:  Persistent bowel dilatation without air-fluid levels. No free air.  The appearance is consistent with a degree of ileus.      Electronically Signed    By: Lowella Grip III M.D.    On: 08/01/2014 09:01         Verified By: Leafy Kindle. WOODRUFF, M.D.,    17-Mar-16 08:58, Chest Portable Single View  Chest Portable Single View   REASON FOR EXAM:    SOB  COMMENTS:       PROCEDURE: DXR - DXR PORTABLE CHEST SINGLE VIEW  - Aug 01 2014  8:58AM     CLINICAL DATA:  Difficulty breathing and wheezing    EXAM:  PORTABLE CHEST - 1 VIEW    COMPARISON:  July 31, 2014    FINDINGS:  Nasogastric tube tip is in the stomach. There are small pleural  effusions bilaterally with bibasilar atelectasis and cardiomegaly.  The pulmonary vascularity is normal. No adenopathy. No bone lesions.     IMPRESSION:  Small  pleural effusions and cardiomegaly. Suspect a degree of  congestive heart failure. Stable patchy bibasilar atelectasis. No  appreciable airspace consolidation.      Electronically Signed    By: Lowella Grip III M.D.    On: 08/01/2014 09:02         Verified By: Leafy Kindle. WOODRUFF, M.D.,    17-Mar-16  15:06, Chest Portable Single View  Chest Portable Single View   REASON FOR EXAM:    Picc Line Placement  COMMENTS:       PROCEDURE: DXR - DXR PORTABLE CHEST SINGLE VIEW  - Aug 01 2014  3:06PM     CLINICAL DATA:  PICC placement    EXAM:  PORTABLE CHEST - 1 VIEW    COMPARISON:  Chest x-ray from the same day 8:44 a.m.    FINDINGS:  New right upper extremity PICC, tip at the distal SVC.  Nasogastric tube tip is difficult to visualize compared to imaging  earlier the same day. No indication of displacement    Dilated bowel noted in the epigastric region, known from dedicated  imaging.    A peripheral left base opacity is again noted. Indistinct appearance  is suspicious for pneumonia. There is pleural-based calcification on  the right. Stable heart size and mediastinal contours.     IMPRESSION:  1. A new rightupper extremity PICC is in good position.  2. Persistence of the left basilar opacity, concerning for  pneumonia.  3. Trace pleural effusions and calcified pleural plaques.  Electronically Signed    By: Monte Fantasia M.D.    On: 08/01/2014 15:42         Verified By: Gilford Silvius, M.D.,    19-Mar-16 14:39, Abdomen Flat and Erect  Abdomen Flat and Erect   REASON FOR EXAM:    sbo  COMMENTS:       PROCEDURE: DXR - DXR ABDOMEN 2 V FLAT AND ERECT  - Aug 03 2014  2:39PM     CLINICAL DATA:  colostomy reversal yesterday, abd pain    EXAM:  ABDOMEN - 2 VIEW    COMPARISON:  CT 08/02/2014    FINDINGS:  NG tube extends into the distal stomach versus second portion of the  duodenum. Dilated loops of small bowel up to 3 cm. No air-fluid  levels. Rectal drain  in the pelvis.     IMPRESSION:  1. NG tube likely within the duodenum.  2. Dilated small bowel most consistent with postoperative ileus.      Electronically Signed    By: Suzy Bouchard M.D.    On: 08/03/2014 14:44         Verified By: Rennis Golden, M.D.,  Cardiology:    12-Mar-16 08:23, ECG  Ventricular Rate 110  Atrial Rate 110  P-R Interval 124  QRS Duration 88  QT 318  QTc 430  P Axis 33  R Axis 7  T Axis 29  ECG interpretation   Sinus tachycardia with Premature supraventricular complexes  Otherwise normal ECG  When compared with ECG of 09-May-2014 12:24,  Premature supraventricular complexes are now Present  QRS voltage has decreased  T wave amplitude has decreased in Anterior leads  Confirmed by PARASCHOS, ALEX ([id number]) on 07/29/2014 4:20:35 PM    Overreader: Miquel Dunn  ECG     13-Mar-16 19:43, ECG  Ventricular Rate 153  Atrial Rate 141  QRS Duration 90  QT 284  QTc 453  R Axis 6  T Axis 14  ECG interpretation   Atrial fibrillation with rapid ventricular response  Statement Not Found (#[id number])  Abnormal ECG  When compared with ECG of 27-Jul-2014 08:23,  Atrial fibrillation has replaced Sinus rhythm  ST now depressed in Anterolateral leads  Confirmed by PARASCHOS, ALEX ([id number]) on 07/29/2014 4:20:15 PM    Overreader: PARASCHOS, ALEX  ECG     14-Mar-16 13:19,  Echo Doppler  Echo Doppler   REASON FOR EXAM:      COMMENTS:       PROCEDURE: [place] - ECHO DOPPLER COMPLETE(TRANSTHOR)  - Jul 29 2014  1:19PM     RESULT: Echocardiogram Report    Patient Name:   LUCIANO CINQUEMANI Date of Exam: 07/29/2014  Medical Rec #:  [id number]          Custom1:  Date of Birth:  [birth year]       Height:       73.0 in  Patient Age:    81 years        Weight:       160.0 lb  Patient Gender: M               BSA:          1.96 m??    Indications: Atrial Fib  Sonographer:    Sherrie Sport RDCS  Referring Phys: Marlyce Huge, A    Sonographer Comments: Technically  challenging study due to less than   ideal echo windows and Due to rapid heart rate.    Summary:   1. Left ventricular ejection fraction, by visual estimation, is 40 to   45%.   2. Mildly decreased global left ventricular systolic function.   3. Mildly dilated left atrium.   4. Mildly dilated right atrium.   5. Mild to moderate mitral valve regurgitation.   6. Mild to moderate tricuspid regurgitation.  2D AND M-MODE MEASUREMENTS (normal ranges within parentheses):  Left Ventricle:          Normal  IVSd (2D):      1.05 cm (0.7-1.1)  LVPWd (2D):     0.79 cm (0.7-1.1) Aorta/LA:                  Normal  LVIDd (2D):     4.54 cm (3.4-5.7) Aortic Root (2D): 3.90 cm (2.4-3.7)  LVIDs (2D):     3.95 cm         Left Atrium (2D): 5.50 cm (1.9-4.0)  LV FS (2D):     13.0 %   (>25%)  LV EF (2D):     28.0 %   (>50%)                                    Right Ventricle:                                    RVd (2D):        1.60 cm  LV DIASTOLIC FUNCTION:  MVPeak E: 0.87 m/s E/e' Ratio: 8.90                      Decel Time: 185 msec  SPECTRAL DOPPLER ANALYSIS (where applicable):  Mitral Valve:  MV P1/2 Time: 53.65 msec  MV Area, PHT: 4.10 cm??  Aortic Valve: AoV Max Vel: 1.17 m/s AoV Peak PG: 5.4 mmHg AoVMean PG:   3.7 mmHg  LVOT Vmax: 0.52 m/s LVOT VTI: 0.090 m LVOT Diameter: 2.20 cm  AoV Area, Vmax: 1.69 cm?? AoV Area, VTI: 1.84 cm?? AoV Area, Vmn: 1.53 cm??  Tricuspid Valve and PA/RV Systolic Pressure: TR Max Velocity: 2.76 m/s RA   Pressure: 5 mmHg RVSP/PASP: 35.5 mmHg  Pulmonic Valve:  PV Max Velocity: 0.79 m/s PV Max  PG: 2.5 mmHg PV Mean PG:    PHYSICIAN INTERPRETATION:  Left Ventricle: The left ventricular internal cavity size was normal. LV   posterior wall thickness was normal. No left ventricular hypertrophy.   Global LV systolic function was mildly decreased. Left ventricular   ejection fraction, by visual estimation, is 40 to 45%. Spectral Doppler   shows normal pattern of LV  diastolic filling.  Right Ventricle: The right ventricularsize is normal.  Left Atrium: The left atrium is mildly dilated.  Right Atrium: The right atrium is mildly dilated.  Pericardium: There is no evidence of pericardial effusion.  Mitral Valve: Mild to moderate mitral valve regurgitation is seen.  Tricuspid Valve: Mild to moderate tricuspid regurgitation is visualized.   The tricuspid regurgitant velocity is 2.76 m/s, and with an assumed right   atrial pressure of 5 mmHg, the estimated right ventricular systolic   pressure is normal at 35.5 mmHg.  Aortic Valve: The aortic valve is normal.  Aorta: The aortic root is normal in size and structure.    [place] MD  Electronically signed by [id number] Isaias Cowman MD  Signature Date/Time: 07/29/2014/4:14:35 PM  *** Final ***    IMPRESSION: .        Verified BySheppard Coil . PARASCHOS, M.D., MD    16-Mar-16 08:52, ECG  Ventricular Rate 83  Atrial Rate 83  P-R Interval 126  QRS Duration 106  QT 392  QTc 460  P Axis 49  R Axis 14  T Axis 38  ECG interpretation   Sinus rhythm with marked sinus arrhythmia  Otherwise normal ECG  When compared with ECG of 28-Jul-2014 19:43,  Sinus rhythm has replaced Atrial fibrillation  Vent. rate has decreased BY  70 BPM  ST no longer depressed in Lateral leads  Confirmed by PARASCHOS, ALEX ([id number]) on 07/31/2014 5:28:32 PM    Overreader: PARASCHOS, ALEX  ECG   CT:    18-Mar-16 09:10, CT Abdomen and Pelvis Without Contrast  CT Abdomen and Pelvis Without Contrast   REASON FOR EXAM:    (1) distension POD 8; (2) distension POD 8  COMMENTS:       PROCEDURE: CT  - CT ABDOMEN AND PELVIS W0  - Aug 02 2014  9:10AM     CLINICAL DATA:  Postop day 8 laparotomy. Distended abdomen. Colon  carcinoma    EXAM:  CT ABDOMEN ANDPELVIS WITHOUT CONTRAST    TECHNIQUE:  Multidetector CT imaging of the abdomen and pelvis was performed  following the standard protocol without IV  contrast.  COMPARISON:  CT 04/24/2014    FINDINGS:  Small to moderate bilateral pleural effusions withcompressive  atelectasis in the lung bases. Calcified pleural plaques bilaterally  consistent with prior asbestos exposure. Cardiac enlargement.  Coronary calcification.    Hepatic cysts as noted on the prior CT. No new hepatic lesion.  Spleen is normal in size. Pancreas shows no edema or mass.  Gallbladder has contrast related to prior intravenous contrast  administration.    Negative for renal mass or obstruction. Left renal cyst is  unchanged. No renal calculi. Urinary bladder contains a Foley  catheter without abnormality.    Abdominal aortic aneurysm unchanged measuring 4.9 x 5.5 cm. No  evidence of aneurysm leak. Atherosclerotic disease throughout the  iliac arteries.    Postop resection of a mass in the right colon. Surgical anastomosis  is present in the right lower quadrant. The terminal ileum is  visualized and is not dilated. There is diffuse small  bowel  dilatation throughout the remainder of the bowel with multiple  air-fluid levels consistent with small bowel obstruction. Note is  made of a right inguinal hernia which was present previously.  Dilated small bowel extends into the hernia and the hernia may be  the cause of small bowel obstruction. In addition, there are  anastomotic clips of the small bowel within the hernia. Left  inguinal hernia also present not containing bowel but fluid. Colon  is decompressed.    Small amount of free fluid in the abdomen consistent with recent  surgery. Fluid collection in the anterior abdominal wall measures  approximately 15 x 9cm compatible with postop seroma. Infection not  excluded and correlation with physical examination recommended.     IMPRESSION:  Postop resection of a mass in the right colon.    Small bowel dilatation with air-fluid levels consistent with small  bowel obstruction. Bowel is dilated down to the  region of  anastomosis in the small bowel. This is in the area of a right  inguinal hernia and the hernia could be causing obstruction. Colon  decompressed.    Small amount of free fluid in the abdomen.    Postop seroma in the anterior abdominal wall. Correlate clinically  for any signs of infection in the anterior abdominal wall.    Abdominal aortic aneurysm measuring 4.9 x 5.5 cm unchanged from the  prior study. Recommend followup by abdomen and pelvis CTA in 3-6  months, and vascular surgery referral/consultation if not already  obtained. This recommendation follows ACR consensus guidelines:  White Paper of the ACR Incidental Findings Committee II on Vascular  Findings. J Am Coll Radiol 2013; 10:789-794.    Electronically Signed    By: Franchot Gallo M.D.    On: 08/02/2014 09:43         Verified By: Truett Perna, M.D.,   Assessment/Plan:  Invasive Device Daily Assessment of Necessity:  Does the patient currently have any of the following indwelling devices? foley  central line catheter   Indwelling Urinary Catheter continued, requirement due to   Reason to continue Indwelling Urinary Catheter for strict Intake/Output monitoring for hemodynamic instability   Central Line continued, requirement due to   Reason to continue Central Line When IV access will be required for less than 8 weeks   Assessment/Plan:  Assessment IMP  weakness  atrial fibrillation on amiodarone  abdominal pain with obstruction  dehydration  weakness  confusion  anemia .   Plan IV hydration  agree with surgical input therapy for abdominal pain  switch amiodarone to IV  use metoprolol IV intermittently for tachycardia  do not recommend anticoagulation   continue DVT prophylaxis  follow-up H&H for anemia  conservative cardiology care at this point  agree with palliative care   Electronic Signatures: Yolonda Kida (MD)  (Signed 20-Mar-16 16:34)  Authored: Chief Complaint, VITAL  SIGNS/ANCILLARY NOTES, Brief Assessment, Lab Results, Radiology Results, Assessment/Plan   Last Updated: 20-Mar-16 16:34 by Yolonda Kida (MD)

## 2014-09-15 NOTE — Op Note (Signed)
PATIENT NAME:  Douglas Ramsey, Douglas Ramsey MR#:  [id number] DATE OF BIRTH:  06/23/1918  DATE OF PROCEDURE:  07/25/2014   PREOPERATIVE DIAGNOSIS: History of colon cancer, status post colectomy x 2 with diverting loop ileostomy.   POSTOPERATIVE DIAGNOSIS:  1. History of colon cancer, status post colectomy x 2 with diverting loop ileostomy. 2. Intraoperative enterotomy.   PROCEDURE PERFORMED:  1. Ileostomy closure.  2. Exploratory laparotomy with extensive lysis of adhesions.  3. Small bowel resection for inadvertent enterotomy.  4. Creation of large skin flaps to close abdominal wall defect.   ANESTHESIA: General.   ESTIMATED BLOOD LOSS: 150 mL.   COMPLICATIONS: Inadvertent enterotomy with need for exploratory laparotomy.   SPECIMEN: Ileostomy.   INDICATION FOR SURGERY: Douglas Ramsey is a 81 year old who is known to have a right colonic mass, as well as a rectal polyp. He had initially had a low anterior resection as well as right hemicolectomy for cancer. To protect anastomoses, I created a loop ileostomy. He was brought to the operating room for reversal of loop ileostomy.   DETAILS OF PROCEDURE: Informed consent was obtained. Douglas Ramsey was brought to the operating room suite. He was induced. Endotracheal tube was placed, general anesthesia was administered. Attempts for Foley failed due to the patient's large foreskin; therefore, Dr. Elnoria Howard came in and did a dorsal slit procedure and placed a Foley. His abdomen was then prepped and draped in standard surgical fashion. His ostomy was closed with 0 silk suture. A timeout was then performed, correctly identifying the patient name, operative site, and procedure to be formed. Next, I incised around the ostomy and dissected it down to the abdominal wall. However, it was stuck medially to the fascia and with some difficulty, I feared that I had an enterotomy and was unable to proceed due to inability to fix enterotomy through small incision with ostomy. I then  made a midline incision. I then dissected out all of the ostomy as well as ran the small bowel, which required significant lysis of adhesions. After everything was dissected out, I then performed a side-to-side functional end-to-end anastomosis with the ileostomy, specimen was sent to lab. I then examined and found a large enterotomy in one spot. Due to the size, and I was afraid that I would stricture it if I closed it transversely; therefore, I did a small side-to-side functional end-to-end anastomosis by placing the staples to the two limbs through the enterotomy and then stapling the enterotomy closed with a TX 60. I then irrigated the abdomen. I then looked at the abdominal wall and found that there was a little bit of fascia between the ostomy site and the midline, likely due to medial placement of ileostomy. I then ligated this small piece of tissue and made flaps on both sides where I was able to close the fascia without much tension with running looped #1 PDS At the areas where there was more of a defect, I placed 4 interrupted Vicryl 2-0 internal tension sutures. After I was happy, I closed the midline incision with staples and closed the ostomy site longitudinally, because that is how it came back best, with staples as well. I placed wicks in the ostomy site. Sterile dressing was then placed over the wound. The patient was then awoken, extubated, and brought to the postanesthesia care unit. There were no immediate complications. Needle, sponge, and instrument counts were correct at the end of the procedure.     ____________________________ Glena Norfolk. Asuna Peth, MD cal:mw D: 07/26/2014  08:47:35 ET T: 07/26/2014 15:50:10 ET JOB#: [id number]  cc: Harrell Gave A. Warrene Kapfer, MD, <Dictator> Floyde Parkins MD ELECTRONICALLY SIGNED 07/28/2014 11:36

## 2014-09-15 NOTE — Consult Note (Signed)
   Comments   Follow up visit made. Pt now awake, oriented. Still with some abd pain with movement. Also pain in buttock from lying in bed. PCA discontinued. Hydrocodone ordered prn. Encouraged pt to ask for pain med if needed.   Electronic Signatures: Ytzel Gubler, Izora Gala (MD)  (Signed 05-Jan-16 14:05)  Authored: Palliative Care   Last Updated: 05-Jan-16 14:05 by Abyan Cadman, Izora Gala (MD)

## 2014-09-15 NOTE — Consult Note (Signed)
PATIENT NAME:  Douglas Ramsey, Douglas Ramsey MR#:  [id number] DATE OF BIRTH:  1918/12/03  DATE OF CONSULTATION:  07/27/2014  REFERRING PHYSICIAN:     Harrell Gave A. Meriwether, MD CONSULTING PHYSICIAN:  Ceasar Lund. Anselm Jungling, MD  PRIMARY CARE PHYSICIAN: Leonie Douglas. Sparks, MD  REASON FOR CONSULTATION: Tachycardia and renal failure.   HISTORY OF PRESENTING ILLNESS: A 81 year old male who was diagnosed with colon cancer 2 months ago. He had a colostomy and is undergoing chemotherapy Admitted by surgical team for reconnection of his intestine and reversal of the ostomy. Surgery is done. The patient was noted to have tachycardia and he also complained of some chest pain, so a medical consult was called in. On further questioning to patient, he said that the chest pain is lower bilateral with cough. He does not feel any palpitation or shortness of breath. There is some worsening in his renal function in the hospital and has blood pressure running on the lower normal side. Denies any other complaints.   REVIEW OF SYSTEMS:  CONSTITUTIONAL: Negative for fever, fatigue, weakness, pain, or weight loss.  EYES: No blurred or double vision, discharge, or redness.  EARS, NOSE, THROAT: No tinnitus, ear pain, or hearing loss.  RESPIRATORY: No cough, wheezing, hemoptysis, or shortness of breath.  CARDIOVASCULAR: No chest pain, orthopnea, edema, arrhythmia, or palpitation.  GASTROINTESTINAL: No nausea, vomiting, diarrhea. Has some abdominal pain status post surgery.  GENITOURINARY: No dysuria, hematuria, or increased frequency.  ENDOCRINE: No heat or cold intolerance. No excessive sweating.  SKIN: No acne, rashes, or lesions.  MUSCULOSKELETAL: No pain or swelling in the joints.  NEUROLOGICAL: No numbness, weakness, tremor, or vertigo.  PSYCHIATRIC: No anxiety, insomnia, or bipolar disorder.  PAST MEDICAL HISTORY:  1.  Coronary artery disease, status post stent in 2008.  2.  History of COPD, on 2 liters of oxygen.  3.   BPH.  4.  Hyperlipidemia.  5.  Mild renal insufficiency. 6.  Gastroesophageal reflux disease.   SOCIAL HISTORY: No smoking. No drinking. Lives with wife.   PAST SURGICAL HISTORY:  1.  Coronary artery disease and stent placement.  2.  He also had cataract surgery.   FAMILY HISTORY: No family history of hypertension or diabetes.   CURRENT MEDICATIONS IN THE HOSPITAL:  1.  Combivent inhaler 4 times a day.  2.  Advair Diskus 1 puff b.i.d.  3.  Hydromorphone PCA drip.  4.  Pantoprazole injection 40 mg IV every 6 hours. 5.  Phenergan injection 12.5 to 25 mg IV every 4 hours.  6.  Metoprolol oral tablet 12.5 mg b.i.d.  7.  Nitroglycerin tablet sublingual 0.4 mg as needed every 5 minutes.   PHYSICAL EXAMINATION:  VITAL SIGNS: Temperature 98.2, pulse 114, respirations 18, blood pressure 93/61, pulse oximetry on 3 liters oxygen - saturation 90%.  GENERAL: The patient is fully alert and oriented to time, place, and person. Does not appear in any acute distress.  HEENT: Head and neck atraumatic. Conjunctivae pink. Oral mucosa moist.  NECK: Supple. No JVD.   RESPIRATORY: Bilateral equal and clear air entry.  CARDIOVASCULAR: S1, S2 present, regular, tachycardia. No murmur.  ABDOMEN: Soft. Postsurgical dressing present, nontender. Bowel sounds slugish.  SKIN: No acne, rashes, or lesions.  EXTREMITIES: No edema.  NEUROLOGICAL: Power 5/5. Able to follow commands. No tremor or rigidity. Sensation intact. Reflexes intact.  JOINTS: No swelling or tenderness.   PSYCHIATRIC: He does not appear in any acute psychiatric illness at this time. GENITOURINARY: Foley catheter present. Light-yellow colored urine  present.   IMPORTANT LABORATORY DATA: Glucose 118, BUN 21, and creatinine was 1.37 on admission, which is 1.64. Sodium 143, potassium 4.5, chloride 109, CO2 of 27, calcium is 8.4. Troponin 0.06 and 0.07. WBC count was 29.5 on admission, came down to 23.7, hemoglobin 11.6, platelet count 134,000.    EKG reviewed, shows sinus tachycardia with some premature beats. No ST-T changes.   ASSESSMENT AND SUGGESTIONS: A 81 year old admitted for reversal of ileostomy surgery. Medical consult for tachycardia and chest pain.  1.  Chest pain with tachycardia: The patient also has some borderline hypotension and worsening  renal failure. Most likely he appears to be in dehydration. Chest pain is atypical and there are no EKG changes. We will continue on telemetry and follow serial troponins, but most likely the reason is because of dehydration. Increase intravenous hydration. Monitor renal function tomorrow.  2.  Acute renal insufficiency, most likely dehydration: Continue following with intravenous  fluid.  3.  Coronary artery disease: Continue metoprolol and other cardiac medications when they are allowed  post surgically. Currently appears stable.  4.  Chronic obstructive pulmonary disease, no exacerbation symptoms: Continue  home inhalers.  5.  History of colon cancer and is following with oncology team: Continue management per them.   CODE STATUS: FULL CODE.   TOTAL TIME SPENT ON THIS CONSULT: 45 minutes.   Dr. Fulton Reek, the patient's primary care physician or his covering doctor will be following the patient tomorrow.    ____________________________ Ceasar Lund Anselm Jungling, MD vgv:ts D: 07/27/2014 21:17:00 ET T: 07/28/2014 01:57:36 ET JOB#: [id number]  cc: Ceasar Lund. Anselm Jungling, MD, <Dictator> Vaughan Basta MD ELECTRONICALLY SIGNED 08/06/2014 12:11

## 2014-09-15 NOTE — Discharge Summary (Signed)
PATIENT NAME:  Douglas Ramsey, Douglas Ramsey MR#:  [id number] DATE OF BIRTH:  08-31-1918  DATE OF ADMISSION:  07/25/2014 DATE OF DISCHARGE:  08/13/2014  DISCHARGE DIAGNOSES: 1.  Right-sided colon cancer status post right hemicolectomy as well as low anterior resection for colon polyp with loop ileostomy.  2.  Status post exploratory laparotomy, closure of loop ileostomy, small bowel resection on March 10th. 3.  Postoperative ileus.  4.  Non-ST elevation myocardial infarction.  5.  History cardiac stents.  6.  History gastroesophageal reflux.  7.  Atrial fibrillation.   INDICATION FOR ADMISSION: Douglas Ramsey is a pleasant 81 year old male who underwent a right hemicolectomy, low anterior resection with diverting loop ostomy who was admitted for closure of ostomy.   HOSPITAL COURSE: Douglas Ramsey underwent above-mentioned surgery. Postoperatively, he was given IV pain meds and IV fluids and made n.p.o. because the difficulty of his surgery; however, he developed severe ileus and respiratory failure and also atrial fibrillation at which time he was noted to have non-ST-elevated MI. Throughout the remaining hospital course, he was begun on amiodarone. In addition, he did have some leakage of fluid and underwent laparotomy with closure of loose suture. Throughout hospital course, his bowel function improved. He had no pain at the time of discharge and today he did have diarrhea, which was slowed with Lomotil, but likely due to extent of colon resection, which will hopefully improve. He was transitioned to p.o. amiodarone, at the time of discharge. At the time of discharge, he was taking good p.o. with minimal pain and was undergoing physical therapy but was extremely weak.   DISCHARGE INSTRUCTIONS: Douglas Ramsey is to return in approximately 1 week. He is to call or return to the ED if he has increased pain, nausea, vomiting, redness, or drainage from the incision.   ____________________________ Glena Norfolk Kaius Daino,  MD cal:sb D: 08/13/2014 06:46:41 ET T: 08/13/2014 07:08:20 ET JOB#: [id number]  cc: Harrell Gave A. Paizlee Kinder, MD, <Dictator> Floyde Parkins MD ELECTRONICALLY SIGNED 08/27/2014 17:39

## 2014-09-20 ENCOUNTER — Emergency Department
Admission: EM | Admit: 2014-09-20 | Discharge: 2014-09-20 | Discharge status: Against Medical Advice | Payer: Commercial Managed Care - HMO | Attending: Emergency Medicine | Admitting: Emergency Medicine

## 2014-09-20 DIAGNOSIS — R2231 Localized swelling, mass and lump, right upper limb: Secondary | ICD-10-CM | POA: Diagnosis not present

## 2014-09-20 LAB — CBC
HEMATOCRIT: 34.9 % — AB (ref 40.0–52.0)
Hemoglobin: 10.8 g/dL — ABNORMAL LOW (ref 13.0–18.0)
MCH: 27.4 pg (ref 26.0–34.0)
MCHC: 31 g/dL — ABNORMAL LOW (ref 32.0–36.0)
MCV: 88.5 fL (ref 80.0–100.0)
PLATELETS: 244 10*3/uL (ref 150–440)
RBC: 3.94 MIL/uL — ABNORMAL LOW (ref 4.40–5.90)
RDW: 20.6 % — ABNORMAL HIGH (ref 11.5–14.5)
WBC: 6.6 10*3/uL (ref 3.8–10.6)

## 2014-09-20 LAB — BASIC METABOLIC PANEL
Anion gap: 8 (ref 5–15)
BUN: 15 mg/dL (ref 6–20)
CO2: 26 mmol/L (ref 22–32)
Calcium: 8.6 mg/dL — ABNORMAL LOW (ref 8.9–10.3)
Chloride: 104 mmol/L (ref 101–111)
Creatinine, Ser: 1.22 mg/dL (ref 0.61–1.24)
GFR calc Af Amer: 57 mL/min — ABNORMAL LOW (ref >60)
GFR, EST NON AFRICAN AMERICAN: 49 mL/min — AB (ref >60)
GLUCOSE: 81 mg/dL (ref 65–99)
Potassium: 4.3 mmol/L (ref 3.5–5.1)
SODIUM: 138 mmol/L (ref 135–145)

## 2014-09-20 NOTE — ED Notes (Signed)
Pt was sent from [place] office for wound to the right FA..states he saw Dr. Pat Patrick yesterday and he removed a large clot for the wound and today has swelling of FA and hand, denies numbness or increased pain..states he went to [place] office this morning and was sent here for eval, stating Pat Patrick was not in his office.

## 2014-11-19 ENCOUNTER — Inpatient Hospital Stay: Payer: Commercial Managed Care - HMO | Admitting: Oncology

## 2014-11-19 ENCOUNTER — Inpatient Hospital Stay: Payer: Commercial Managed Care - HMO | Attending: Oncology

## 2014-11-19 ENCOUNTER — Other Ambulatory Visit: Payer: Self-pay

## 2014-11-19 DIAGNOSIS — C187 Malignant neoplasm of sigmoid colon: Secondary | ICD-10-CM | POA: Insufficient documentation

## 2014-11-19 DIAGNOSIS — C189 Malignant neoplasm of colon, unspecified: Secondary | ICD-10-CM

## 2014-12-17 DIAGNOSIS — M7711 Lateral epicondylitis, right elbow: Secondary | ICD-10-CM | POA: Diagnosis not present

## 2014-12-17 DIAGNOSIS — M1812 Unilateral primary osteoarthritis of first carpometacarpal joint, left hand: Secondary | ICD-10-CM | POA: Diagnosis not present

## 2014-12-26 ENCOUNTER — Inpatient Hospital Stay: Payer: Commercial Managed Care - HMO | Admitting: Oncology

## 2014-12-26 ENCOUNTER — Inpatient Hospital Stay: Payer: Commercial Managed Care - HMO | Attending: Oncology

## 2015-01-29 ENCOUNTER — Other Ambulatory Visit: Payer: Commercial Managed Care - HMO

## 2015-01-29 ENCOUNTER — Ambulatory Visit: Payer: Commercial Managed Care - HMO | Admitting: Oncology

## 2015-02-03 ENCOUNTER — Inpatient Hospital Stay: Payer: Commercial Managed Care - HMO | Attending: Oncology | Admitting: *Deleted

## 2015-02-03 ENCOUNTER — Inpatient Hospital Stay (HOSPITAL_BASED_OUTPATIENT_CLINIC_OR_DEPARTMENT_OTHER): Payer: Commercial Managed Care - HMO | Admitting: Oncology

## 2015-02-03 VITALS — BP 115/71 | HR 66 | Temp 97.7°F | Resp 18 | Wt 164.9 lb

## 2015-02-03 DIAGNOSIS — Z79899 Other long term (current) drug therapy: Secondary | ICD-10-CM

## 2015-02-03 DIAGNOSIS — C189 Malignant neoplasm of colon, unspecified: Secondary | ICD-10-CM | POA: Insufficient documentation

## 2015-02-03 DIAGNOSIS — D509 Iron deficiency anemia, unspecified: Secondary | ICD-10-CM | POA: Diagnosis not present

## 2015-02-03 LAB — CBC
HCT: 41.6 % (ref 40.0–52.0)
HEMOGLOBIN: 13.7 g/dL (ref 13.0–18.0)
MCH: 28.2 pg (ref 26.0–34.0)
MCHC: 32.8 g/dL (ref 32.0–36.0)
MCV: 85.9 fL (ref 80.0–100.0)
Platelets: 155 10*3/uL (ref 150–440)
RBC: 4.84 MIL/uL (ref 4.40–5.90)
RDW: 16.6 % — ABNORMAL HIGH (ref 11.5–14.5)
WBC: 7 10*3/uL (ref 3.8–10.6)

## 2015-02-03 LAB — IRON AND TIBC
Iron: 98 ug/dL (ref 45–182)
Saturation Ratios: 33 % (ref 17.9–39.5)
TIBC: 296 ug/dL (ref 250–450)
UIBC: 198 ug/dL

## 2015-02-03 LAB — SAMPLE TO BLOOD BANK

## 2015-02-03 LAB — FERRITIN: FERRITIN: 64 ng/mL (ref 24–336)

## 2015-02-04 LAB — CEA: CEA: 1.9 ng/mL (ref 0.0–4.7)

## 2015-02-10 NOTE — Progress Notes (Signed)
[facility]  [phone] [phone]) [phone]  ID: Tollie Eth OB: April 11, 1919  MR#: [id number]  MAU#:[id number]  Patient Care Team: Idelle Crouch, MD as PCP - General (Internal Medicine)  CHIEF COMPLAINT:  Chief Complaint  Patient presents with  . Colon Cancer    INTERVAL HISTORY: Patient returns to clinic today for routine six-month follow-up and further evaluation. He currently feels well and is asymptomatic.  He has no neurologic complaints. He denies any fevers. He has a good appetite and denies weight loss. He denies any chest pain or shortness of breath. He denies any nausea, vomiting, constipation, or diarrhea. He has no melena or hematochezia. Patient offers no specific complaints today.  REVIEW OF SYSTEMS:   Review of Systems  Constitutional: Positive for malaise/fatigue.  Respiratory: Negative.   Cardiovascular: Negative.   Gastrointestinal: Negative.  Negative for blood in stool and melena.  Neurological: Negative.     As per HPI. Otherwise, a complete review of systems is negatve.  PAST MEDICAL HISTORY:  GERD, CAD status post stent.  PAST SURGICAL HISTORY:  Partial colon resection, partial small bowel resection, cataract surgery.  FAMILY HISTORY: Reviewed and unchanged. No reported history of malignancy or chronic disease.     ADVANCED DIRECTIVES:    HEALTH MAINTENANCE: Social History  Substance Use Topics  . Smoking status: Not on file  . Smokeless tobacco: Not on file  . Alcohol Use: Not on file     Colonoscopy:  PAP:  Bone density:  Lipid panel:  Not on File  Current Outpatient Prescriptions  Medication Sig Dispense Refill  . albuterol-ipratropium (COMBIVENT) 18-103 MCG/ACT inhaler Inhale 2 puffs into the lungs 2 (two) times daily.    Marland Kitchen amiodarone (PACERONE) 200 MG tablet Take 200 mg by mouth daily.    . Ascorbic Acid (VITAMIN C) 100 MG tablet Take 100 mg by mouth daily.    Marland Kitchen aspirin 81 MG tablet Take 81 mg  by mouth daily.    . Calcium Carb-Cholecalciferol (CALCIUM 600 + D PO) Take 1 tablet by mouth daily.    . Cranberry 500 MG TABS Take 500 mg by mouth daily.    . diphenoxylate-atropine (LOMOTIL) 2.5-0.025 MG per tablet Take 1 tablet by mouth 4 (four) times daily as needed for diarrhea or loose stools.    . Ferrous Sulfate 143 (45 FE) MG TBCR Take by mouth daily.    . Fluticasone-Salmeterol (ADVAIR) 250-50 MCG/DOSE AEPB Inhale 1 puff into the lungs 2 (two) times daily.    Marland Kitchen glucosamine-chondroitin 500-400 MG tablet Take 1 tablet by mouth daily.    . Multiple Vitamin (MULTIVITAMIN) tablet Take 1 tablet by mouth daily.    . Omega-3 Fatty Acids (FISH OIL DOUBLE STRENGTH) 1200 MG CAPS Take by mouth at bedtime.    . simvastatin (ZOCOR) 20 MG tablet Take 20 mg by mouth daily.    . vitamin B-12 (CYANOCOBALAMIN) 100 MCG tablet Take 100 mcg by mouth daily.     No current facility-administered medications for this visit.    OBJECTIVE: Filed Vitals:   02/03/15 1548  BP: 115/71  Pulse: 66  Temp: 97.7 F (36.5 C)  Resp: 18     Body mass index is 23.01 kg/(m^2).    ECOG FS:1 - Symptomatic but completely ambulatory  General: Well-developed, well-nourished, no acute distress. Eyes: Pink conjunctiva, anicteric sclera. Lungs: Clear to auscultation bilaterally. Heart: Regular rate and rhythm. No rubs, murmurs, or gallops. Abdomen: Soft, nontender, nondistended. No organomegaly noted, normoactive bowel sounds.  Musculoskeletal: No edema, cyanosis, or clubbing. Neuro: Alert, answering all questions appropriately. Cranial nerves grossly intact. Skin: No rashes or petechiae noted. Psych: Normal affect.  LAB RESULTS:  Lab Results  Component Value Date   NA 138 09/20/2014   K 4.3 09/20/2014   CL 104 09/20/2014   CO2 26 09/20/2014   GLUCOSE 81 09/20/2014   BUN 15 09/20/2014   CREATININE 1.22 09/20/2014   CALCIUM 8.6* 09/20/2014   PROT 5.5* 05/21/2014   ALBUMIN 2.1* 05/21/2014   AST 34 05/21/2014     ALT 22 05/21/2014   ALKPHOS 66 05/21/2014   BILITOT 0.6 05/21/2014   GFRNONAA 49* 09/20/2014   GFRAA 57* 09/20/2014    Lab Results  Component Value Date   WBC 7.0 02/03/2015   NEUTROABS 5.6 06/04/2014   HGB 13.7 02/03/2015   HCT 41.6 02/03/2015   MCV 85.9 02/03/2015   PLT 155 02/03/2015     STUDIES: No results found.  ASSESSMENT: Stage IIIB adenocarcinoma of the colon.  PLAN:    1. Colon cancer: CT results reported in December 2015 revealed no obvious metastatic disease. CEA is 1.9 today. Despite patient's stage of disease, no chemotherapy was given his advanced age. No intervention is needed at this time. Return to clinic in 6 months for repeat laboratory work and further evaluation.   2. Iron deficiency anemia: Patient's hemoglobin and iron stores are now within normal limits. Return to clinic in 6 months as above.    Patient expressed understanding and was in agreement with this plan. He also understands that He can call clinic at any time with any questions, concerns, or complaints.   No matching staging information was found for the patient.  Lloyd Huger, MD   02/10/2015 2:01 PM

## 2015-04-25 DIAGNOSIS — I252 Old myocardial infarction: Secondary | ICD-10-CM | POA: Diagnosis not present

## 2015-04-25 DIAGNOSIS — E782 Mixed hyperlipidemia: Secondary | ICD-10-CM | POA: Diagnosis not present

## 2015-04-25 DIAGNOSIS — I25119 Atherosclerotic heart disease of native coronary artery with unspecified angina pectoris: Secondary | ICD-10-CM | POA: Diagnosis not present

## 2015-04-25 DIAGNOSIS — Z6823 Body mass index (BMI) 23.0-23.9, adult: Secondary | ICD-10-CM | POA: Diagnosis not present

## 2015-04-25 DIAGNOSIS — Z0000 Encounter for general adult medical examination without abnormal findings: Secondary | ICD-10-CM | POA: Diagnosis not present

## 2015-04-25 DIAGNOSIS — I719 Aortic aneurysm of unspecified site, without rupture: Secondary | ICD-10-CM | POA: Diagnosis not present

## 2015-06-12 DIAGNOSIS — J449 Chronic obstructive pulmonary disease, unspecified: Secondary | ICD-10-CM | POA: Diagnosis not present

## 2015-06-12 DIAGNOSIS — E782 Mixed hyperlipidemia: Secondary | ICD-10-CM | POA: Diagnosis not present

## 2015-06-12 DIAGNOSIS — Z79899 Other long term (current) drug therapy: Secondary | ICD-10-CM | POA: Diagnosis not present

## 2015-06-12 DIAGNOSIS — J431 Panlobular emphysema: Secondary | ICD-10-CM | POA: Diagnosis not present

## 2015-06-12 DIAGNOSIS — D649 Anemia, unspecified: Secondary | ICD-10-CM | POA: Diagnosis not present

## 2015-06-12 DIAGNOSIS — J301 Allergic rhinitis due to pollen: Secondary | ICD-10-CM | POA: Diagnosis not present

## 2015-07-24 ENCOUNTER — Encounter: Payer: Self-pay | Admitting: *Deleted

## 2015-07-24 ENCOUNTER — Inpatient Hospital Stay
Admission: AD | Admit: 2015-07-24 | Discharge: 2015-07-29 | DRG: 194 | Disposition: A | Discharge status: Home / Self Care | Payer: Commercial Managed Care - HMO | Source: Ambulatory Visit | Attending: Internal Medicine | Admitting: Internal Medicine

## 2015-07-24 DIAGNOSIS — R0602 Shortness of breath: Secondary | ICD-10-CM | POA: Diagnosis not present

## 2015-07-24 DIAGNOSIS — K219 Gastro-esophageal reflux disease without esophagitis: Secondary | ICD-10-CM | POA: Diagnosis present

## 2015-07-24 DIAGNOSIS — J441 Chronic obstructive pulmonary disease with (acute) exacerbation: Secondary | ICD-10-CM | POA: Diagnosis present

## 2015-07-24 DIAGNOSIS — D696 Thrombocytopenia, unspecified: Secondary | ICD-10-CM | POA: Diagnosis not present

## 2015-07-24 DIAGNOSIS — N179 Acute kidney failure, unspecified: Secondary | ICD-10-CM | POA: Diagnosis not present

## 2015-07-24 DIAGNOSIS — J690 Pneumonitis due to inhalation of food and vomit: Secondary | ICD-10-CM

## 2015-07-24 DIAGNOSIS — I129 Hypertensive chronic kidney disease with stage 1 through stage 4 chronic kidney disease, or unspecified chronic kidney disease: Secondary | ICD-10-CM | POA: Diagnosis not present

## 2015-07-24 DIAGNOSIS — J189 Pneumonia, unspecified organism: Principal | ICD-10-CM | POA: Diagnosis present

## 2015-07-24 DIAGNOSIS — E785 Hyperlipidemia, unspecified: Secondary | ICD-10-CM | POA: Diagnosis present

## 2015-07-24 DIAGNOSIS — N183 Chronic kidney disease, stage 3 (moderate): Secondary | ICD-10-CM | POA: Diagnosis present

## 2015-07-24 DIAGNOSIS — J449 Chronic obstructive pulmonary disease, unspecified: Secondary | ICD-10-CM | POA: Diagnosis not present

## 2015-07-24 DIAGNOSIS — J310 Chronic rhinitis: Secondary | ICD-10-CM | POA: Diagnosis present

## 2015-07-24 DIAGNOSIS — J181 Lobar pneumonia, unspecified organism: Secondary | ICD-10-CM | POA: Diagnosis not present

## 2015-07-24 DIAGNOSIS — A419 Sepsis, unspecified organism: Secondary | ICD-10-CM | POA: Diagnosis not present

## 2015-07-24 LAB — CBC
HEMATOCRIT: 41 % (ref 40.0–52.0)
HEMOGLOBIN: 13.2 g/dL (ref 13.0–18.0)
MCH: 27.9 pg (ref 26.0–34.0)
MCHC: 32.2 g/dL (ref 32.0–36.0)
MCV: 86.5 fL (ref 80.0–100.0)
Platelets: 138 10*3/uL — ABNORMAL LOW (ref 150–440)
RBC: 4.75 MIL/uL (ref 4.40–5.90)
RDW: 15.7 % — ABNORMAL HIGH (ref 11.5–14.5)
WBC: 23.1 10*3/uL — AB (ref 3.8–10.6)

## 2015-07-24 LAB — BASIC METABOLIC PANEL
ANION GAP: 9 (ref 5–15)
BUN: 19 mg/dL (ref 6–20)
CALCIUM: 8.5 mg/dL — AB (ref 8.9–10.3)
CO2: 24 mmol/L (ref 22–32)
Chloride: 102 mmol/L (ref 101–111)
Creatinine, Ser: 1.48 mg/dL — ABNORMAL HIGH (ref 0.61–1.24)
GFR, EST AFRICAN AMERICAN: 45 mL/min — AB (ref >60)
GFR, EST NON AFRICAN AMERICAN: 39 mL/min — AB (ref >60)
GLUCOSE: 163 mg/dL — AB (ref 65–99)
POTASSIUM: 3.9 mmol/L (ref 3.5–5.1)
SODIUM: 135 mmol/L (ref 135–145)

## 2015-07-24 LAB — INFLUENZA PANEL BY PCR (TYPE A & B)
H1N1FLUPCR: NOT DETECTED
INFLBPCR: NEGATIVE
Influenza A By PCR: NEGATIVE

## 2015-07-24 MED ORDER — ENSURE ENLIVE PO LIQD
237.0000 mL | Freq: Two times a day (BID) | ORAL | Status: DC
  Administered 2015-07-25 – 2015-07-29 (×9): 237 mL via ORAL

## 2015-07-24 MED ORDER — ALUM & MAG HYDROXIDE-SIMETH 200-200-20 MG/5ML PO SUSP: 30.0000 mL | Freq: Four times a day (QID) | ORAL | Status: DC | PRN

## 2015-07-24 MED ORDER — SENNOSIDES-DOCUSATE SODIUM 8.6-50 MG PO TABS: 1.0000 | ORAL_TABLET | Freq: Every evening | ORAL | Status: DC | PRN

## 2015-07-24 MED ORDER — PANTOPRAZOLE SODIUM 40 MG PO TBEC
40.0000 mg | DELAYED_RELEASE_TABLET | Freq: Every day | ORAL | Status: DC
  Administered 2015-07-24 – 2015-07-29 (×6): 40 mg via ORAL
  Filled 2015-07-24 (×6): qty 1

## 2015-07-24 MED ORDER — AZITHROMYCIN 250 MG PO TABS
500.0000 mg | ORAL_TABLET | Freq: Every day | ORAL | Status: DC
  Administered 2015-07-24 – 2015-07-29 (×6): 500 mg via ORAL
  Filled 2015-07-24 (×6): qty 2

## 2015-07-24 MED ORDER — ACETAMINOPHEN 325 MG PO TABS: 650.0000 mg | ORAL_TABLET | Freq: Four times a day (QID) | ORAL | Status: DC | PRN

## 2015-07-24 MED ORDER — SODIUM CHLORIDE 0.9 % IV SOLN: INTRAVENOUS | Status: DC

## 2015-07-24 MED ORDER — ENOXAPARIN SODIUM 40 MG/0.4ML ~~LOC~~ SOLN
40.0000 mg | SUBCUTANEOUS | Status: DC
  Administered 2015-07-24 – 2015-07-28 (×5): 40 mg via SUBCUTANEOUS
  Filled 2015-07-24 (×5): qty 0.4

## 2015-07-24 MED ORDER — ACETAMINOPHEN 650 MG RE SUPP: 650.0000 mg | Freq: Four times a day (QID) | RECTAL | Status: DC | PRN

## 2015-07-24 MED ORDER — CEFTRIAXONE SODIUM 1 G IJ SOLR
1.0000 g | INTRAMUSCULAR | Status: DC
  Administered 2015-07-24: 1 g via INTRAVENOUS
  Filled 2015-07-24 (×2): qty 10

## 2015-07-24 NOTE — H&P (Signed)
Douglas Ramsey NAME: Douglas Ramsey    MR#:  [id number]  DATE OF BIRTH:  [birth year]  DATE OF ADMISSION:  07/24/2015  PRIMARY CARE PHYSICIAN: SPARKS,JEFFREY D, MD   REQUESTING/REFERRING PHYSICIAN: Dr. Raul Del  CHIEF COMPLAINT: Shortness of breath   No chief complaint on file.   HISTORY OF PRESENT ILLNESS:  Douglas Ramsey  is a 82 y.o. male with a known history of COPD, sent in from Dr. Gust Brooms office because of pneumonia. Patient went to see him because of worsening shortness of breath, feeling  Poor for the last 2 days. Patient chest x-ray showed large right-sided pneumonia. So we are admitting patient because of advanced age with pneumonia and elevated white count up to 24. And they are denies any other complaints at this time. No fever. Oxygen saturation is 95% on room air here.  PAST MEDICAL HISTORY:   Past Medical History  Diagnosis Date  . Myocardial infarction ([place])     stent placaement  . Shortness of breath dyspnea     PAST SURGICAL HISTOIRY:   Past Surgical History  Procedure Laterality Date  . Colon surgery      ostomy (reversed in december)    SOCIAL HISTORY:   Social History  Substance Use Topics  . Smoking status: Never Smoker   . Smokeless tobacco: Never Used  . Alcohol Use: No    FAMILY HISTORY:  History reviewed. No pertinent family history.    DRUG ALLERGIES:  No Known Allergies  REVIEW OF SYSTEMS:  CONSTITUTIONAL: No fever, fatigue or weakness.  EYES: No blurred or double vision.  EARS, NOSE, AND THROAT: No tinnitus or ear pain.  RESPIRATORY: No cough, worsening shortness of breath. Patient went to see Dr. Raul Del requesting oxygen.  CARDIOVASCULAR: No chest pain, orthopnea, edema.  GASTROINTESTINAL: No nausea, vomiting, diarrhea or abdominal pain.  GENITOURINARY: No dysuria, hematuria.  ENDOCRINE: No polyuria, nocturia,  HEMATOLOGY: No anemia, easy bruising or bleeding SKIN: No rash  or lesion. MUSCULOSKELETAL: No joint pain or arthritis.   NEUROLOGIC: No tingling, numbness, weakness.  PSYCHIATRY: No anxiety or depression.   MEDICATIONS AT HOME:   Prior to Admission medications   Medication Sig Start Date End Date Taking? Authorizing Provider  albuterol-ipratropium (COMBIVENT) 18-103 MCG/ACT inhaler Inhale 2 puffs into the lungs 2 (two) times daily.    Historical Provider, MD  amiodarone (PACERONE) 200 MG tablet Take 200 mg by mouth daily.    Historical Provider, MD  Ascorbic Acid (VITAMIN C) 100 MG tablet Take 100 mg by mouth daily.    Historical Provider, MD  aspirin 81 MG tablet Take 81 mg by mouth daily.    Historical Provider, MD  Calcium Carb-Cholecalciferol (CALCIUM 600 + D PO) Take 1 tablet by mouth daily.    Historical Provider, MD  Cranberry 500 MG TABS Take 500 mg by mouth daily. Reported on 07/24/2015    Historical Provider, MD  diphenoxylate-atropine (LOMOTIL) 2.5-0.025 MG per tablet Take 1 tablet by mouth 4 (four) times daily as needed for diarrhea or loose stools.    Historical Provider, MD  Ferrous Sulfate 143 (45 FE) MG TBCR Take by mouth daily.    Historical Provider, MD  Fluticasone-Salmeterol (ADVAIR) 250-50 MCG/DOSE AEPB Inhale 1 puff into the lungs 2 (two) times daily.    Historical Provider, MD  glucosamine-chondroitin 500-400 MG tablet Take 1 tablet by mouth daily.    Historical Provider, MD  Multiple Vitamin (MULTIVITAMIN) tablet Take 1 tablet by  mouth daily.    Historical Provider, MD  Omega-3 Fatty Acids (FISH OIL DOUBLE STRENGTH) 1200 MG CAPS Take by mouth at bedtime.    Historical Provider, MD  simvastatin (ZOCOR) 20 MG tablet Take 20 mg by mouth daily.    Historical Provider, MD  vitamin B-12 (CYANOCOBALAMIN) 100 MCG tablet Take 100 mcg by mouth daily.    Historical Provider, MD      VITAL SIGNS:  Blood pressure 115/56, pulse 76, temperature 98.6 F (37 C), temperature source Oral, resp. rate 26, SpO2 95 %.  PHYSICAL EXAMINATION:   GENERAL:  82 y.o.-year-old patient lying in the bed with no acute distress.  EYES: Pupils equal, round, reactive to light and accommodation. No scleral icterus. Extraocular muscles intact.  HEENT: Head atraumatic, normocephalic. Oropharynx and nasopharynx clear.  NECK:  Supple, no jugular venous distention. No thyroid enlargement, no tenderness.  LUNGS: Normal breath sounds bilaterally, decreased breath sounds on the right side. no wheezing, rales,rhonchi or crepitation. No use of accessory muscles of respiration.  CARDIOVASCULAR: S1, S2 normal. No murmurs, rubs, or gallops.  ABDOMEN: Soft, nontender, nondistended. Bowel sounds present. No organomegaly or mass.  EXTREMITIES: No pedal edema, cyanosis, or clubbing.  NEUROLOGIC: Cranial nerves II through XII are intact. Muscle strength 5/5 in all extremities. Sensation intact. Gait not checked.  PSYCHIATRIC: The patient is alert and oriented x 3.  SKIN: No obvious rash, lesion, or ulcer.   LABORATORY PANEL:   CBC No results for input(s): WBC, HGB, HCT, PLT in the last 168 hours. ------------------------------------------------------------------------------------------------------------------  Chemistries  No results for input(s): NA, K, CL, CO2, GLUCOSE, BUN, CREATININE, CALCIUM, MG, AST, ALT, ALKPHOS, BILITOT in the last 168 hours.  Invalid input(s): GFRCGP ------------------------------------------------------------------------------------------------------------------  Cardiac Enzymes No results for input(s): TROPONINI in the last 168 hours. ------------------------------------------------------------------------------------------------------------------  RADIOLOGY:  No results found.  EKG:   Orders placed or performed in visit on 05/09/14  . EKG 12-Lead   Not f done,will order one. IMPRESSION AND PLAN:   #1 community-acquired pneumonia: Start the patient on Rocephin, azithromycin, continue oxygen, check blood cultures, check  WBC tomorrow. And also check the flu swab. According to Dr. Gust Brooms note CT of the chest if  the patient to pneumonia does not get better. #2 essential hypertension: Stable. #3 GERD number: Continue PPIs Chronic renal insufficiency Rhinitis: Start Flonase, zyrtec    All the records are reviewed and case discussed with ED provider. Management plans discussed with the patient, family and they are in agreement.  CODE STATUS: full  TOTAL TIME TAKING CARE OF THIS PATIENT: 55 minutes.    Epifanio Lesches M.D on 07/24/2015 at 4:43 PM  Between 7am to 6pm - Pager - [phone]  After 6pm go to [email or web address] - password [email or web address]  Office  [phone]  CC: Primary care physician; Idelle Crouch, MD  Note: This dictation was prepared with Dragon dictation along with smaller phrase technology. Any transcriptional errors that result from this process are unintentional.

## 2015-07-25 LAB — BASIC METABOLIC PANEL
Anion gap: 7 (ref 5–15)
BUN: 19 mg/dL (ref 6–20)
CHLORIDE: 105 mmol/L (ref 101–111)
CO2: 25 mmol/L (ref 22–32)
Calcium: 8.4 mg/dL — ABNORMAL LOW (ref 8.9–10.3)
Creatinine, Ser: 1.41 mg/dL — ABNORMAL HIGH (ref 0.61–1.24)
GFR calc Af Amer: 48 mL/min — ABNORMAL LOW (ref >60)
GFR calc non Af Amer: 41 mL/min — ABNORMAL LOW (ref >60)
GLUCOSE: 112 mg/dL — AB (ref 65–99)
Potassium: 3.8 mmol/L (ref 3.5–5.1)
Sodium: 137 mmol/L (ref 135–145)

## 2015-07-25 LAB — CBC
HEMATOCRIT: 40 % (ref 40.0–52.0)
HEMOGLOBIN: 13.2 g/dL (ref 13.0–18.0)
MCH: 28.8 pg (ref 26.0–34.0)
MCHC: 33 g/dL (ref 32.0–36.0)
MCV: 87.5 fL (ref 80.0–100.0)
PLATELETS: 144 10*3/uL — AB (ref 150–440)
RBC: 4.57 MIL/uL (ref 4.40–5.90)
RDW: 15.7 % — ABNORMAL HIGH (ref 11.5–14.5)
WBC: 21.3 10*3/uL — ABNORMAL HIGH (ref 3.8–10.6)

## 2015-07-25 MED ORDER — IPRATROPIUM-ALBUTEROL 0.5-2.5 (3) MG/3ML IN SOLN
3.0000 mL | Freq: Four times a day (QID) | RESPIRATORY_TRACT | Status: DC
  Administered 2015-07-25 – 2015-07-27 (×8): 3 mL via RESPIRATORY_TRACT
  Filled 2015-07-25 (×9): qty 3

## 2015-07-25 MED ORDER — ASPIRIN EC 81 MG PO TBEC
81.0000 mg | DELAYED_RELEASE_TABLET | Freq: Every day | ORAL | Status: DC
Start: 2015-07-25 — End: 2015-07-29
  Administered 2015-07-25 – 2015-07-29 (×5): 81 mg via ORAL
  Filled 2015-07-25 (×5): qty 1

## 2015-07-25 MED ORDER — SIMVASTATIN 20 MG PO TABS
20.0000 mg | ORAL_TABLET | Freq: Every day | ORAL | Status: DC
  Administered 2015-07-25 – 2015-07-28 (×4): 20 mg via ORAL
  Filled 2015-07-25 (×4): qty 1

## 2015-07-25 MED ORDER — DEXTROSE 5 % IV SOLN
1.0000 g | INTRAVENOUS | Status: DC
  Administered 2015-07-25 – 2015-07-28 (×4): 1 g via INTRAVENOUS
  Filled 2015-07-25 (×5): qty 10

## 2015-07-25 MED ORDER — AMIODARONE HCL 200 MG PO TABS
200.0000 mg | ORAL_TABLET | Freq: Every day | ORAL | Status: DC
  Administered 2015-07-25 – 2015-07-29 (×5): 200 mg via ORAL
  Filled 2015-07-25 (×5): qty 1

## 2015-07-25 MED ORDER — METHYLPREDNISOLONE SODIUM SUCC 125 MG IJ SOLR
60.0000 mg | Freq: Every day | INTRAMUSCULAR | Status: DC
  Administered 2015-07-25 – 2015-07-27 (×3): 60 mg via INTRAVENOUS
  Filled 2015-07-25 (×3): qty 2

## 2015-07-25 MED ORDER — BUDESONIDE 0.25 MG/2ML IN SUSP
0.2500 mg | Freq: Two times a day (BID) | RESPIRATORY_TRACT | Status: DC
  Administered 2015-07-25 – 2015-07-29 (×9): 0.25 mg via RESPIRATORY_TRACT
  Filled 2015-07-25 (×9): qty 2

## 2015-07-25 NOTE — Progress Notes (Signed)
Patient ID: Douglas Ramsey, male   DOB: Nov 07, 1918, 82 y.o.   MRN: [id number] [facility] PROGRESS NOTE  ALEJANDRO AJELLO [id number] DOB: [birth year] DOA: 07/24/2015 PCP: Idelle Crouch, MD  HPI/Subjective: Patient with shortness of breath and wheezing. Sent in from Dr. Gust Brooms office yesterday for pneumonia.  Objective: Filed Vitals:   07/25/15 0731 07/25/15 1304  BP: 103/60 116/63  Pulse: 83 96  Temp: 98.2 F (36.8 C)   Resp: 18     Filed Weights   07/24/15 1554  Weight: 76.295 kg (168 lb 3.2 oz)    ROS: Review of Systems  Constitutional: Negative for fever and chills.  Eyes: Negative for blurred vision.  Respiratory: Positive for cough, shortness of breath and wheezing.   Cardiovascular: Negative for chest pain.  Gastrointestinal: Negative for nausea, vomiting, abdominal pain, diarrhea and constipation.  Genitourinary: Negative for dysuria.  Musculoskeletal: Negative for joint pain.  Neurological: Negative for dizziness and headaches.   Exam: Physical Exam  Constitutional: He is oriented to person, place, and time.  HENT:  Nose: No mucosal edema.  Mouth/Throat: No oropharyngeal exudate or posterior oropharyngeal edema.  Eyes: Conjunctivae, EOM and lids are normal. Pupils are equal, round, and reactive to light.  Neck: No JVD present. Carotid bruit is not present. No edema present. No thyroid mass and no thyromegaly present.  Cardiovascular: S1 normal and S2 normal.  Exam reveals no gallop.   No murmur heard. Pulses:      Dorsalis pedis pulses are 2+ on the right side, and 2+ on the left side.  Respiratory: No respiratory distress. He has decreased breath sounds in the right middle field, the right lower field, the left middle field and the left lower field. He has wheezes in the right middle field, the right lower field, the left middle field and the left lower field. He has no rhonchi. He has no rales.  GI: Soft. Bowel sounds are normal. There is no  tenderness.  Musculoskeletal:       Right ankle: He exhibits swelling.       Left ankle: He exhibits swelling.  Lymphadenopathy:    He has no cervical adenopathy.  Neurological: He is alert and oriented to person, place, and time. No cranial nerve deficit.  Skin: Skin is warm. No rash noted. Nails show no clubbing.  Psychiatric: He has a normal mood and affect.      Data Reviewed: Basic Metabolic Panel:  Recent Labs Lab 07/24/15 1727 07/25/15 0515  NA 135 137  K 3.9 3.8  CL 102 105  CO2 24 25  GLUCOSE 163* 112*  BUN 19 19  CREATININE 1.48* 1.41*  CALCIUM 8.5* 8.4*   CBC:  Recent Labs Lab 07/24/15 1727 07/25/15 0515  WBC 23.1* 21.3*  HGB 13.2 13.2  HCT 41.0 40.0  MCV 86.5 87.5  PLT 138* 144*   Scheduled Meds: . amiodarone  200 mg Oral Daily  . aspirin EC  81 mg Oral Daily  . azithromycin  500 mg Oral Daily  . budesonide (PULMICORT) nebulizer solution  0.25 mg Nebulization BID  . cefTRIAXone (ROCEPHIN)  IV  1 g Intravenous Q24H  . enoxaparin (LOVENOX) injection  40 mg Subcutaneous Q24H  . feeding supplement (ENSURE ENLIVE)  237 mL Oral BID BM  . ipratropium-albuterol  3 mL Nebulization Q6H  . methylPREDNISolone (SOLU-MEDROL) injection  60 mg Intravenous Daily  . pantoprazole  40 mg Oral Daily  . simvastatin  20 mg Oral QHS    Assessment/Plan:  1. Clinical sepsis, pneumonia on outpatient chest x-ray, fever, leukocytosis. Patient put on Rocephin and Zithromax. Repeat chest x-ray tomorrow 2. COPD exacerbation I started DuoNeb nebulizer solution and budesonide nebulizer and Solu-Medrol 3. Arrhythmia history on aspirin and amiodarone 4. History of CAD 5. Hyperlipidemia unspecified on Zocor 6. Thrombocytopenia- likely with infection continue to monitor 7. Acute renal failure on chronic kidney disease stage III. Continue to monitor. Give one fluid bolus and check creatinine tomorrow.  Code Status:     Code Status Orders        Start     Ordered    07/24/15 1529  Full code   Continuous     07/24/15 1531    Code Status History    Date Active Date Inactive Code Status Order ID Comments User Context   This patient has a current code status but no historical code status.    Advance Directive Documentation        Most Recent Value   Type of Advance Directive  Living will, Healthcare Power of Attorney   Pre-existing out of facility DNR order (yellow form or pink MOST form)     "MOST" Form in Place?       Family Communication: wrong phone number in computer Disposition Plan: To be determined  Antibiotics:  Rocephin  Zithromax  Time spent: 25 minutes  Loletha Grayer  [facility]

## 2015-07-26 ENCOUNTER — Inpatient Hospital Stay: Payer: Commercial Managed Care - HMO

## 2015-07-26 MED ORDER — MAGIC MOUTHWASH
10.0000 mL | Freq: Three times a day (TID) | ORAL | Status: DC
Start: 2015-07-26 — End: 2015-07-29
  Administered 2015-07-26 – 2015-07-29 (×10): 10 mL via ORAL
  Filled 2015-07-26 (×12): qty 10

## 2015-07-26 NOTE — Progress Notes (Signed)
Patient ID: Douglas Ramsey, male   DOB: 1918/10/21, 82 y.o.   MRN: [id number] [facility] PROGRESS NOTE  Douglas Ramsey [id number] DOB: 02/04/1919 DOA: 07/24/2015 PCP: Douglas Reek D, MD  HPI/Subjective: Patient's breathing is improved. Cough is improved no chest pain   Objective: Filed Vitals:   07/26/15 0417 07/26/15 0722  BP: 104/64 117/66  Pulse: 83 77  Temp: 97.6 F (36.4 C) 97.6 F (36.4 C)  Resp: 16 18    Filed Weights   07/24/15 1554  Weight: 76.295 kg (168 lb 3.2 oz)    ROS: Review of Systems  Constitutional: Negative for fever and chills.  Eyes: Negative for blurred vision.  Respiratory: Improve cough, shortness of breath and wheezing.   Cardiovascular: Negative for chest pain.  Gastrointestinal: Negative for nausea, vomiting, abdominal pain, diarrhea and constipation.  Genitourinary: Negative for dysuria.  Musculoskeletal: Negative for joint pain.  Neurological: Negative for dizziness and headaches.   Exam: Physical Exam  Constitutional: He is oriented to person, place, and time.  HENT:  Nose: No mucosal edema.  Mouth/Throat: No oropharyngeal exudate or posterior oropharyngeal edema.  Eyes: Conjunctivae, EOM and lids are normal. Pupils are equal, round, and reactive to light.  Neck: No JVD present. Carotid bruit is not present. No edema present. No thyroid mass and no thyromegaly present.  Cardiovascular: S1 normal and S2 normal.  Exam reveals no gallop.   No murmur heard. Pulses:      Dorsalis pedis pulses are 2+ on the right side, and 2+ on the left side.  Respiratory: No respiratory distress. Occasional rhonchus breath sounds  .  GI: Soft. Bowel sounds are normal. There is no tenderness.  Musculoskeletal:       Right ankle: He exhibits swelling.       Left ankle: He exhibits swelling.  Lymphadenopathy:    He has no cervical adenopathy.  Neurological: He is alert and oriented to person, place, and time. No cranial nerve deficit.   Skin: Skin is warm. No rash noted. Nails show no clubbing.  Psychiatric: He has a normal mood and affect.      Data Reviewed: Basic Metabolic Panel:  Recent Labs Lab 07/24/15 1727 07/25/15 0515  NA 135 137  K 3.9 3.8  CL 102 105  CO2 24 25  GLUCOSE 163* 112*  BUN 19 19  CREATININE 1.48* 1.41*  CALCIUM 8.5* 8.4*   CBC:  Recent Labs Lab 07/24/15 1727 07/25/15 0515  WBC 23.1* 21.3*  HGB 13.2 13.2  HCT 41.0 40.0  MCV 86.5 87.5  PLT 138* 144*   Scheduled Meds: . amiodarone  200 mg Oral Daily  . aspirin EC  81 mg Oral Daily  . azithromycin  500 mg Oral Daily  . budesonide (PULMICORT) nebulizer solution  0.25 mg Nebulization BID  . cefTRIAXone (ROCEPHIN)  IV  1 g Intravenous Q24H  . enoxaparin (LOVENOX) injection  40 mg Subcutaneous Q24H  . feeding supplement (ENSURE ENLIVE)  237 mL Oral BID BM  . ipratropium-albuterol  3 mL Nebulization Q6H  . methylPREDNISolone (SOLU-MEDROL) injection  60 mg Intravenous Daily  . pantoprazole  40 mg Oral Daily  . simvastatin  20 mg Oral QHS    Assessment/Plan:   1. Multifocal pneumonia : Continue ceftriaxone and azithromycin no evidence of aspiration.  2.  acute on chronic COPD exasperation continue nebulizers and steroids 3. History of CAD  continue aspirin  4. Hyperlipidemia unspecified on Zocor 5. Thrombocytopenia- likely with infection continue to monitor improve  6. Acute renal failure on chronic kidney disease stage III.  renal function stable   Code Status:     Code Status Orders        Start     Ordered   07/24/15 1529  Full code   Continuous     07/24/15 1531    Code Status History    Date Active Date Inactive Code Status Order ID Comments User Context   This patient has a current code status but no historical code status.    Advance Directive Documentation        Most Recent Value   Type of Advance Directive  Living will, Healthcare Power of Attorney   Pre-existing out of facility DNR order (yellow  form or pink MOST form)     "MOST" Form in Place?       Family Communication: wrong phone number in computer Disposition Plan: To be determined  Antibiotics:  Rocephin  Zithromax  Time spent: 25 minutes  Douglas Ramsey, [facility]

## 2015-07-26 NOTE — Progress Notes (Signed)
Physical Therapy Evaluation Patient Details Name: Douglas Ramsey MRN: [id number] DOB: Sep 05, 1918 Today's Date: 07/26/2015   History of Present Illness  Patient is a 82 y.o. male admitted on 24 July 2015 for pneumonia. Patient went to see Dr. Raul Del due to worsening SOB for 2 days and was sent to ED. PMH includes COPD, CKD III, and CAD.  Clinical Impression  Patient is a pleasant 82 y.o. Male who is reportedly previously independent in ambulating short distances in the community with no hx of falls and driving to/from appointments. Patient was alert and oriented at time of evaluation. Patient admits to "furniture walking" in household. On evaluation, patient demonstrates quick ambulation with no LOB utilizing RW. Had tendency to lift/carry RW, especially when making turns. Oxygen saturations remained >90%; however, patient and step sons would like a home pulse oximeter if possible. PT performed 2 bouts of ambulation with patient after seated rest break, totaling ~80-90' with fatigue in LEs. Patient will benefit from continued PT services while in the hospital to improve muscle strength/endurance and knowledge of safe equipment use to prevent falls in community upon discharge.     Follow Up Recommendations No PT follow up    Equipment Recommendations  Rolling walker with 5" wheels    Recommendations for Other Services       Precautions / Restrictions Precautions Precautions: Fall Restrictions Weight Bearing Restrictions: No      Mobility  Bed Mobility Overal bed mobility: Independent             General bed mobility comments: Patient is independent in all bed mobility.  Transfers Overall transfer level: Independent Equipment used: Rolling walker (2 wheeled)             General transfer comment: Patient modified independent in all transfers utilizing RW.  Ambulation/Gait Ambulation/Gait assistance: Min guard Ambulation Distance (Feet): 50 Feet Assistive device:  Rolling walker (2 wheeled)       General Gait Details: Patient ambulates with CGA and RW, requiring verbal cues for safety awareness and equipment usage. Performed 3 standing rest breaks to monitor oxygen saturations which remained >90% with patient demonstrating no SOB.  Stairs            Wheelchair Mobility    Modified Rankin (Stroke Patients Only)       Balance Overall balance assessment: Modified Independent                                           Pertinent Vitals/Pain Pain Assessment: No/denies pain    Home Living Family/patient expects to be discharged to:: Private residence Living Arrangements: Spouse/significant other Available Help at Discharge: Family;Available 24 hours/day;Available PRN/intermittently Type of Home: House Home Access: Stairs to enter Entrance Stairs-Rails: Left Entrance Stairs-Number of Steps: 6 Home Layout: One level Home Equipment: None      Prior Function Level of Independence: Needs assistance   Gait / Transfers Assistance Needed: Patient reports independence with ambulating short distances in community and driving to appointments.  ADL's / Homemaking Assistance Needed: Performed by wife        Hand Dominance        Extremity/Trunk Assessment   Upper Extremity Assessment: Overall WFL for tasks assessed           Lower Extremity Assessment: Overall WFL for tasks assessed         Communication   Communication: No  difficulties;HOH  Cognition Arousal/Alertness: Awake/alert Behavior During Therapy: WFL for tasks assessed/performed Overall Cognitive Status: Within Functional Limits for tasks assessed                      General Comments      Exercises        Assessment/Plan    PT Assessment Patient needs continued PT services  PT Diagnosis Generalized weakness   PT Problem List Decreased strength;Decreased knowledge of use of DME;Decreased safety awareness;Decreased activity  tolerance  PT Treatment Interventions DME instruction;Gait training;Functional mobility training;Therapeutic activities;Therapeutic exercise;Balance training;Patient/family education   PT Goals (Current goals can be found in the Care Plan section) Acute Rehab PT Goals Patient Stated Goal: "To go home" PT Goal Formulation: With patient/family Time For Goal Achievement: 08/09/15 Potential to Achieve Goals: Good    Frequency Min 2X/week   Barriers to discharge        Co-evaluation               End of Session Equipment Utilized During Treatment: Gait belt Activity Tolerance: Patient tolerated treatment well;Patient limited by fatigue Patient left: in bed;with call bell/phone within reach;with bed alarm set;with family/visitor present Nurse Communication: Mobility status         Time: 1000-1032 PT Time Calculation (min) (ACUTE ONLY): 32 min   Charges:   PT Evaluation $PT Eval Low Complexity: 1 Procedure PT Treatments $Gait Training: 8-22 mins   PT G Codes:        Dorice Lamas, PT, DPT 07/26/2015, 10:44 AM

## 2015-07-26 NOTE — Progress Notes (Signed)
Pt experiencing sore throat. Temp 97.7. Dr patel notified macular degeneration to add magic mouthwash

## 2015-07-27 ENCOUNTER — Inpatient Hospital Stay: Payer: Commercial Managed Care - HMO

## 2015-07-27 LAB — CBC WITH DIFFERENTIAL/PLATELET
BASOS ABS: 0 10*3/uL (ref 0–0.1)
Basophils Relative: 0 %
Eosinophils Absolute: 0 10*3/uL (ref 0–0.7)
Eosinophils Relative: 0 %
HEMATOCRIT: 43.2 % (ref 40.0–52.0)
HEMOGLOBIN: 14.5 g/dL (ref 13.0–18.0)
Lymphocytes Relative: 3 %
Lymphs Abs: 0.7 10*3/uL — ABNORMAL LOW (ref 1.0–3.6)
MCH: 28.8 pg (ref 26.0–34.0)
MCHC: 33.5 g/dL (ref 32.0–36.0)
MCV: 86 fL (ref 80.0–100.0)
Monocytes Absolute: 0.9 10*3/uL (ref 0.2–1.0)
Monocytes Relative: 4 %
NEUTROS ABS: 21.7 10*3/uL — AB (ref 1.4–6.5)
NEUTROS PCT: 93 %
Platelets: 240 10*3/uL (ref 150–440)
RBC: 5.03 MIL/uL (ref 4.40–5.90)
RDW: 15.8 % — AB (ref 11.5–14.5)
WBC: 23.2 10*3/uL — ABNORMAL HIGH (ref 3.8–10.6)

## 2015-07-27 LAB — BASIC METABOLIC PANEL
ANION GAP: 12 (ref 5–15)
BUN: 40 mg/dL — ABNORMAL HIGH (ref 6–20)
CALCIUM: 8.7 mg/dL — AB (ref 8.9–10.3)
CHLORIDE: 104 mmol/L (ref 101–111)
CO2: 21 mmol/L — AB (ref 22–32)
Creatinine, Ser: 1.4 mg/dL — ABNORMAL HIGH (ref 0.61–1.24)
GFR calc non Af Amer: 41 mL/min — ABNORMAL LOW (ref >60)
GFR, EST AFRICAN AMERICAN: 48 mL/min — AB (ref >60)
Glucose, Bld: 179 mg/dL — ABNORMAL HIGH (ref 65–99)
POTASSIUM: 4 mmol/L (ref 3.5–5.1)
Sodium: 137 mmol/L (ref 135–145)

## 2015-07-27 MED ORDER — FERROUS SULFATE 325 (65 FE) MG PO TABS
325.0000 mg | ORAL_TABLET | Freq: Every morning | ORAL | Status: DC
  Administered 2015-07-27 – 2015-07-29 (×3): 325 mg via ORAL
  Filled 2015-07-27 (×3): qty 1

## 2015-07-27 MED ORDER — GUAIFENESIN ER 600 MG PO TB12
600.0000 mg | ORAL_TABLET | Freq: Two times a day (BID) | ORAL | Status: DC
  Administered 2015-07-27 – 2015-07-29 (×5): 600 mg via ORAL
  Filled 2015-07-27 (×5): qty 1

## 2015-07-27 MED ORDER — FLUTICASONE PROPIONATE 50 MCG/ACT NA SUSP
2.0000 | Freq: Every day | NASAL | Status: DC
  Filled 2015-07-27: qty 16

## 2015-07-27 MED ORDER — PREDNISONE 50 MG PO TABS
50.0000 mg | ORAL_TABLET | Freq: Every day | ORAL | Status: DC
  Administered 2015-07-28 – 2015-07-29 (×2): 50 mg via ORAL
  Filled 2015-07-27 (×3): qty 1

## 2015-07-27 MED ORDER — IPRATROPIUM-ALBUTEROL 0.5-2.5 (3) MG/3ML IN SOLN
3.0000 mL | Freq: Three times a day (TID) | RESPIRATORY_TRACT | Status: DC
  Administered 2015-07-27 – 2015-07-29 (×4): 3 mL via RESPIRATORY_TRACT
  Filled 2015-07-27 (×4): qty 3

## 2015-07-27 MED ORDER — GUAIFENESIN 100 MG/5ML PO SOLN: 5.0000 mL | ORAL | Status: DC | PRN

## 2015-07-27 NOTE — Progress Notes (Signed)
Pt takes ferrous sulfate 1 pill qd at home . Wants to renew med. Dr patel orders ferrous sulfate 325mg  po qd

## 2015-07-27 NOTE — Care Management Important Message (Signed)
Important Message  Patient Details  Name: Douglas Ramsey MRN: [id number] Date of Birth: 14-May-1919   Medicare Important Message Given:  Yes    [name], RN 07/27/2015, 4:08 PM

## 2015-07-27 NOTE — Progress Notes (Signed)
Patient ID: Douglas Ramsey, male   DOB: 1919/02/02, 82 y.o.   MRN: [id number] [facility] PROGRESS NOTE  Douglas Ramsey [id number] DOB: 05/09/1919 DOA: 07/24/2015 PCP: Douglas Reek D, MD  HPI/Subjective: Complains a sore throat shortness of breath with activity   Objective: Filed Vitals:   07/27/15 0505 07/27/15 0800  BP: 104/60 115/64  Pulse: 92 84  Temp: 97.8 F (36.6 C) 97.6 F (36.4 C)  Resp: 20     Filed Weights   07/24/15 1554  Weight: 76.295 kg (168 lb 3.2 oz)    ROS: Review of Systems  Constitutional: Negative for fever and chills.  Eyes: Negative for blurred vision.  Respiratory: Improve cough, shortness of breath and  wheezing resolved Cardiovascular: Negative for chest pain.  Gastrointestinal: Negative for nausea, vomiting, abdominal pain, diarrhea and constipation.  Genitourinary: Negative for dysuria.  Musculoskeletal: Negative for joint pain.  Neurological: Negative for dizziness and headaches.   Exam: Physical Exam  Constitutional: He is oriented to person, place, and time.  HENT:  Nose: No mucosal edema.  Mouth/Throat: No oropharyngeal exudate or posterior oropharyngeal edema.  Eyes: Conjunctivae, EOM and lids are normal. Pupils are equal, round, and reactive to light.  Neck: No JVD present. Carotid bruit is not present. No edema present. No thyroid mass and no thyromegaly present.  Cardiovascular: S1 normal and S2 normal.  Exam reveals no gallop.   No murmur heard. Pulses:      Dorsalis pedis pulses are 2+ on the right side, and 2+ on the left side.  Respiratory: No respiratory distress. Occasional rhonchus breath sounds in both lungs .  GI: Soft. Bowel sounds are normal. There is no tenderness.  Musculoskeletal:       Right ankle: He exhibits swelling.       Left ankle: He exhibits swelling.  Lymphadenopathy:    He has no cervical adenopathy.  Neurological: He is alert and oriented to person, place, and time. No cranial nerve  deficit.  Skin: Skin is warm. No rash noted. Nails show no clubbing.  Psychiatric: He has a normal mood and affect.      Data Reviewed: Basic Metabolic Panel:  Recent Labs Lab 07/24/15 1727 07/25/15 0515  NA 135 137  K 3.9 3.8  CL 102 105  CO2 24 25  GLUCOSE 163* 112*  BUN 19 19  CREATININE 1.48* 1.41*  CALCIUM 8.5* 8.4*   CBC:  Recent Labs Lab 07/24/15 1727 07/25/15 0515  WBC 23.1* 21.3*  HGB 13.2 13.2  HCT 41.0 40.0  MCV 86.5 87.5  PLT 138* 144*   Scheduled Meds: . amiodarone  200 mg Oral Daily  . aspirin EC  81 mg Oral Daily  . azithromycin  500 mg Oral Daily  . budesonide (PULMICORT) nebulizer solution  0.25 mg Nebulization BID  . cefTRIAXone (ROCEPHIN)  IV  1 g Intravenous Q24H  . enoxaparin (LOVENOX) injection  40 mg Subcutaneous Q24H  . feeding supplement (ENSURE ENLIVE)  237 mL Oral BID BM  . guaiFENesin  600 mg Oral BID  . ipratropium-albuterol  3 mL Nebulization Q6H  . magic mouthwash  10 mL Oral TID  . methylPREDNISolone (SOLU-MEDROL) injection  60 mg Intravenous Daily  . pantoprazole  40 mg Oral Daily  . simvastatin  20 mg Oral QHS    Assessment/Plan:   1. Multifocal pneumonia : Continue ceftriaxone and azithromycin no evidence of aspiration. I will repeat a CBC if still elevated obtain a CT scan of the chest 2.  acute on chronic COPD exasperation continue nebulizers wean steroids  3. History of CAD  continue aspirin  4. Hyperlipidemia unspecified on Zocor 5. Thrombocytopenia- likely with infection continue to monitor improve CBC pending from this morning 6. Acute renal failure on chronic kidney disease stage III.  renal function stable   Code Status:     Code Status Orders        Start     Ordered   07/24/15 1529  Full code   Continuous     07/24/15 1531    Code Status History    Date Active Date Inactive Code Status Order ID Comments User Context   This patient has a current code status but no historical code status.    Advance  Directive Documentation        Most Recent Value   Type of Advance Directive  Living will, Healthcare Power of Attorney   Pre-existing out of facility DNR order (yellow form or pink MOST form)     "MOST" Form in Place?       Family Communication: wrong phone number in computer Disposition Plan: To be determined  Antibiotics:  Rocephin  Zithromax  Time spent: 25 minutes  Douglas Ramsey, [facility]

## 2015-07-27 NOTE — Progress Notes (Signed)
Physical Therapy Treatment Patient Details Name: NATALIA GIEBEL MRN: [id number] DOB: [birth year] Today's Date: 07/27/2015    History of Present Illness Patient is a 82 y.o. male admitted on 24 July 2015 for pneumonia. Patient went to see Dr. Raul Del due to worsening SOB for 2 days and was sent to ED. PMH includes COPD, CKD III, and CAD.    PT Comments    Pt shows good confidence ambulating with walker and is relatively safe with using only occasional hallway rails. Pt has some fatigue with the effort, but his O2 remains in the 90s and generally he is able to talk the entire time.  Pt displays functional strength and good confidence - feels ready to go home.  Pt will not need PT follow up on d/c.  Follow Up Recommendations  No PT follow up     Equipment Recommendations       Recommendations for Other Services       Precautions / Restrictions Precautions Precautions: Fall Restrictions Weight Bearing Restrictions: No    Mobility  Bed Mobility Overal bed mobility: Independent                Transfers Overall transfer level: Independent                  Ambulation/Gait Ambulation/Gait assistance: Supervision Ambulation Distance (Feet): 350 Feet Assistive device: Rolling walker (2 wheeled);None (hand rail)       General Gait Details: Pt walks ~200 ft with walker showing no hesitancy or safety issues and community approrpiate speed.  He then walks another 100-150 ft with alternating use of hallway rails and no AD.  He is less stable/confident/fast with this but has no LOBs or significant safety issues.    Stairs            Wheelchair Mobility    Modified Rankin (Stroke Patients Only)       Balance Overall balance assessment: No apparent balance deficits (not formally assessed)                                  Cognition Arousal/Alertness: Awake/alert Behavior During Therapy: WFL for tasks assessed/performed Overall Cognitive  Status: Within Functional Limits for tasks assessed                      Exercises General Exercises - Lower Extremity Ankle Circles/Pumps: AROM;10 reps Long Arc Quad: AROM;10 reps Hip ABduction/ADduction: Strengthening;10 reps Hip Flexion/Marching: Strengthening;10 reps    General Comments        Pertinent Vitals/Pain Pain Assessment: No/denies pain    Home Living                      Prior Function            PT Goals (current goals can now be found in the care plan section) Progress towards PT goals: Progressing toward goals    Frequency  Min 2X/week    PT Plan Current plan remains appropriate    Co-evaluation             End of Session Equipment Utilized During Treatment: Gait belt Activity Tolerance: Patient tolerated treatment well Patient left: with bed alarm set;with call bell/phone within reach     Time: 1426-1453 PT Time Calculation (min) (ACUTE ONLY): 27 min  Charges:  $Gait Training: 8-22 mins $Therapeutic Exercise: 8-22 mins  G Codes:     Wayne Both, PT, DPT [phone]   Kreg Shropshire 07/27/2015, 4:44 PM

## 2015-07-28 LAB — BASIC METABOLIC PANEL
ANION GAP: 6 (ref 5–15)
BUN: 41 mg/dL — ABNORMAL HIGH (ref 6–20)
CHLORIDE: 109 mmol/L (ref 101–111)
CO2: 25 mmol/L (ref 22–32)
Calcium: 8.5 mg/dL — ABNORMAL LOW (ref 8.9–10.3)
Creatinine, Ser: 1.29 mg/dL — ABNORMAL HIGH (ref 0.61–1.24)
GFR calc Af Amer: 53 mL/min — ABNORMAL LOW (ref >60)
GFR, EST NON AFRICAN AMERICAN: 46 mL/min — AB (ref >60)
GLUCOSE: 129 mg/dL — AB (ref 65–99)
POTASSIUM: 4.8 mmol/L (ref 3.5–5.1)
SODIUM: 140 mmol/L (ref 135–145)

## 2015-07-28 LAB — CBC
HEMATOCRIT: 39.2 % — AB (ref 40.0–52.0)
Hemoglobin: 13.1 g/dL (ref 13.0–18.0)
MCH: 28.9 pg (ref 26.0–34.0)
MCHC: 33.3 g/dL (ref 32.0–36.0)
MCV: 86.7 fL (ref 80.0–100.0)
PLATELETS: 244 10*3/uL (ref 150–440)
RBC: 4.53 MIL/uL (ref 4.40–5.90)
RDW: 15.6 % — ABNORMAL HIGH (ref 11.5–14.5)
WBC: 19.9 10*3/uL — ABNORMAL HIGH (ref 3.8–10.6)

## 2015-07-28 MED ORDER — AZELASTINE HCL 0.1 % NA SOLN
1.0000 | Freq: Two times a day (BID) | NASAL | Status: DC
  Administered 2015-07-28 – 2015-07-29 (×3): 1 via NASAL
  Filled 2015-07-28: qty 30

## 2015-07-28 NOTE — Progress Notes (Signed)
Patient ID: Douglas Ramsey, male   DOB: September 04, 1918, 82 y.o.   MRN: [id number] [facility] PROGRESS NOTE  Douglas Ramsey [id number] DOB: 05-Sep-1918 DOA: 07/24/2015 PCP: Idelle Crouch, MD  HPI/Subjective:  Continues to complain of nasal congestion and cough.   Objective: Filed Vitals:   07/28/15 0748 07/28/15 1045  BP: 120/83   Pulse: 75 85  Temp: 97.5 F (36.4 C)   Resp: 18     Filed Weights   07/24/15 1554  Weight: 76.295 kg (168 lb 3.2 oz)    ROS: Review of Systems  Constitutional: Negative for fever and chills.  Eyes: Negative for blurred vision.  positive nasal congestion Respiratory: Improve cough, shortness of breath  Cardiovascular: Negative for chest pain.  Gastrointestinal: Negative for nausea, vomiting, abdominal pain, diarrhea and constipation.  Genitourinary: Negative for dysuria.  Musculoskeletal: Negative for joint pain.  Neurological: Negative for dizziness and headaches.   Exam: Physical Exam  Constitutional: He is oriented to person, place, and time.  HENT:  Nose: No mucosal edema.  Mouth/Throat: No oropharyngeal exudate or posterior oropharyngeal edema.  Eyes: Conjunctivae, EOM and lids are normal. Pupils are equal, round, and reactive to light.  Neck: No JVD present. Carotid bruit is not present. No edema present. No thyroid mass and no thyromegaly present.  Cardiovascular: S1 normal and S2 normal.  Exam reveals no gallop.   No murmur heard. Pulses:      Dorsalis pedis pulses are 2+ on the right side, and 2+ on the left side.  Respiratory: No respiratory distress. Occasional rhonchus breath sounds in both lungs .  GI: Soft. Bowel sounds are normal. There is no tenderness.  Musculoskeletal:       Right ankle: He exhibits swelling.       Left ankle: He exhibits swelling.  Lymphadenopathy:    He has no cervical adenopathy.  Neurological: He is alert and oriented to person, place, and time. No cranial nerve deficit.  Skin: Skin  is warm. No rash noted. Nails show no clubbing.  Psychiatric: He has a normal mood and affect.      Data Reviewed: Basic Metabolic Panel:  Recent Labs Lab 07/24/15 1727 07/25/15 0515 07/27/15 1040 07/28/15 0343  NA 135 137 137 140  K 3.9 3.8 4.0 4.8  CL 102 105 104 109  CO2 24 25 21* 25  GLUCOSE 163* 112* 179* 129*  BUN 19 19 40* 41*  CREATININE 1.48* 1.41* 1.40* 1.29*  CALCIUM 8.5* 8.4* 8.7* 8.5*   CBC:  Recent Labs Lab 07/24/15 1727 07/25/15 0515 07/27/15 1040 07/28/15 0343  WBC 23.1* 21.3* 23.2* 19.9*  NEUTROABS  --   --  21.7*  --   HGB 13.2 13.2 14.5 13.1  HCT 41.0 40.0 43.2 39.2*  MCV 86.5 87.5 86.0 86.7  PLT 138* 144* 240 244   Scheduled Meds: . amiodarone  200 mg Oral Daily  . aspirin EC  81 mg Oral Daily  . azithromycin  500 mg Oral Daily  . budesonide (PULMICORT) nebulizer solution  0.25 mg Nebulization BID  . cefTRIAXone (ROCEPHIN)  IV  1 g Intravenous Q24H  . enoxaparin (LOVENOX) injection  40 mg Subcutaneous Q24H  . feeding supplement (ENSURE ENLIVE)  237 mL Oral BID BM  . ferrous sulfate  325 mg Oral q morning - 10a  . fluticasone  2 spray Each Nare Daily  . guaiFENesin  600 mg Oral BID  . ipratropium-albuterol  3 mL Nebulization TID  . magic mouthwash  10  mL Oral TID  . pantoprazole  40 mg Oral Daily  . predniSONE  50 mg Oral Q breakfast  . simvastatin  20 mg Oral QHS    Assessment/Plan:   1. Community acquired pneumonia confirmed by CT scan W BC count trending down continue antibiotics with ceftriaxone azithromycin 2.  acute on chronic COPD exasperation continue nebulizers wean steroids  3. History of CAD  continue aspirin  4. Hyperlipidemia unspecified on Zocor 5. Thrombocytopenia- likely with infection now normal 6. Acute renal failure on chronic kidney disease stage III.  renal function stable   Code Status:     Code Status Orders        Start     Ordered   07/24/15 1529  Full code   Continuous     07/24/15 1531    Code  Status History    Date Active Date Inactive Code Status Order ID Comments User Context   This patient has a current code status but no historical code status.    Advance Directive Documentation        Most Recent Value   Type of Advance Directive  Living will, Healthcare Power of Attorney   Pre-existing out of facility DNR order (yellow form or pink MOST form)     "MOST" Form in Place?       Family Communication: Patient Disposition Plan: Discharge tomorrow  Antibiotics:  Rocephin  Zithromax  Time spent: 25 minutes  Oona Trammel, [facility]

## 2015-07-28 NOTE — Progress Notes (Signed)
Physical Therapy Treatment Patient Details Name: Douglas Ramsey MRN: [id number] DOB: [birth year] Today's Date: 07/28/2015    History of Present Illness Patient is a 82 y.o. male admitted on 24 July 2015 for pneumonia. Patient went to see Dr. Raul Del due to worsening SOB for 2 days and was sent to ED. PMH includes COPD, CKD III, and CAD.    PT Comments    Pt eager to walk and participate in therapy. Pt demonstrates significant distance in ambulation and wishes to keep going especially on second walk with rolling walker. Pt occasionally sounds short of breath, but able to continue a conversation without distress. Pt does take two stand rest breaks on long walk. No safety or balance issues noted. Pt participates in stair climbing and stand exercises as well with seated rest break between exercise and ambulation. Pt comfortable up in chair. O2 saturation 92-93% throughout session. Pt/spouse educated on energy conservation and controlling shortness of breath through this method. Continue PT for strengthening and/or higher level balance activities for optimal and safe return home.   Follow Up Recommendations  No PT follow up     Equipment Recommendations  Rolling walker with 5" wheels    Recommendations for Other Services       Precautions / Restrictions Precautions Precautions: Fall Restrictions Weight Bearing Restrictions: No    Mobility  Bed Mobility Overal bed mobility: Independent             General bed mobility comments: No issues  Transfers Overall transfer level: Independent Equipment used: None             General transfer comment: STS independendent from bed and chair  Ambulation/Gait Ambulation/Gait assistance: Supervision Ambulation Distance (Feet): 385 Feet (second walk > 1200 feet with rw. 2 stand rest breaks) Assistive device: None;Rolling walker (2 wheeled) Gait Pattern/deviations: WFL(Within Functional Limits)   Gait velocity interpretation: at or  above normal speed for age/gender General Gait Details: Pt walks initially without AD 385 ft consecutive with up/down 4 steps. Later wishes second walk with rw and ambulates over 1200 ft with rw, O2 sats remain 92-93% on room air. Minor SOB although pt carries on conversation throughout    Stairs Stairs: Yes Stairs assistance: Min guard Stair Management: One rail Left;Step to pattern Number of Stairs: 4 General stair comments: Encouraged to perform step to pattern to conserve energy and control breathing  Wheelchair Mobility    Modified Rankin (Stroke Patients Only)       Balance Overall balance assessment: Modified Independent                                  Cognition Arousal/Alertness: Awake/alert Behavior During Therapy: WFL for tasks assessed/performed Overall Cognitive Status: Within Functional Limits for tasks assessed                      Exercises General Exercises - Lower Extremity Hip ABduction/ADduction: Strengthening;Both;10 reps;Standing Straight Leg Raises: Strengthening;Both;10 reps;Standing Hip Flexion/Marching: Strengthening;Both;20 reps;Standing Heel Raises: Strengthening;Both;10 reps;Standing Other Exercises Other Exercises: Stand hip extension B 10 reps Other Exercises: Stand exercises with UE support    General Comments        Pertinent Vitals/Pain Pain Assessment: No/denies pain    Home Living                      Prior Function  PT Goals (current goals can now be found in the care plan section) Progress towards PT goals: Progressing toward goals    Frequency  Min 2X/week    PT Plan Current plan remains appropriate    Co-evaluation             End of Session Equipment Utilized During Treatment: Gait belt Activity Tolerance: Patient tolerated treatment well Patient left: in chair;with call bell/phone within reach;with chair alarm set;with family/visitor present     Time:  [id number] PT Time Calculation (min) (ACUTE ONLY): 54 min  Charges:  $Gait Training: 38-52 mins $Therapeutic Exercise: 8-22 mins                    G Codes:      Charlaine Dalton, PTA 07/28/2015, 12:06 PM

## 2015-07-28 NOTE — Care Management Note (Signed)
Case Management Note  Patient Details  Name: Douglas Ramsey MRN: [id number] Date of Birth: 24-Jul-1918  Subjective/Objective:      82yo Douglas Ramsey was admitted 07/24/15 with a diagnosis of PNA. Hx COPD. He resides at home with his wife. PCP=Dr Sparks. Pharmacy=[facility] on [address]. Assistive equipment includes a cane. Douglas Ramsey reports that he does not need any other equipment. He is requesting to be discharged home with home oxygen which he does not have at this time. His nurse reports that he has walked around the nurses station without oxygen without distress. Will reevaluate his oxygen saturations again prior to discharge. Case management will follow for discharge planning.               Action/Plan:   Expected Discharge Date:                  Expected Discharge Plan:     In-House Referral:     Discharge planning Services     Post Acute Care Choice:    Choice offered to:     DME Arranged:    DME Agency:     HH Arranged:    [place] Agency:     Status of Service:     Medicare Important Message Given:  Yes Date Medicare IM Given:    Medicare IM give by:    Date Additional Medicare IM Given:    Additional Medicare Important Message give by:     If discussed at [place] of Stay Meetings, dates discussed:    Additional Comments:  Jaylenne Hamelin A, RN 07/28/2015, 2:08 PM

## 2015-07-29 LAB — CULTURE, BLOOD (ROUTINE X 2)
CULTURE: NO GROWTH
Culture: NO GROWTH

## 2015-07-29 MED ORDER — DOXYCYCLINE MONOHYDRATE 100 MG PO CAPS: 100.0000 mg | ORAL_CAPSULE | Freq: Two times a day (BID) | ORAL | Status: AC

## 2015-07-29 MED ORDER — PREDNISONE 10 MG (21) PO TBPK: 10.0000 mg | ORAL_TABLET | Freq: Every day | ORAL | Status: DC

## 2015-07-29 NOTE — Progress Notes (Signed)
Completed discharge instructions with patient, no complaints except for dining complaints and a member of there department arrived to review issues.  Wife came to room and patient discharging home to wife.  VSS.

## 2015-07-29 NOTE — Discharge Summary (Signed)
Douglas Ramsey, 82 y.o., DOB 11-07-1918, MRN [id number]. Admission date: 07/24/2015 Discharge Date 07/29/2015 Primary MD Idelle Crouch, MD Admitting Physician Epifanio Lesches, MD  Admission Diagnosis  pneumonia  Discharge Diagnosis   Active Problems:   Community-acquired pneumonia Acute on chronic COPD exasperation History of coronary artery Hyperlipidemia Thrombocytopenia Acute renal failure on chronic kidney disease      Hospital CourseLeslie Ramsey is a 82 y.o. male with a known history of COPD, sent in from Dr. Gust Brooms office because of pneumonia. Patient went to see him because of worsening shortness of breath, feeling Poor for the last 2 days. Patient chest x-ray showed large right-sided pneumonia. Patient was admitted to the hospital and started on antibiotics. His W counts continue to be elevated therefore which showed consolidation in the right upper lobe. No other changes were noted. had a CT scan of the chest his breathing is improved. He is not coughing much. He does complain of having nasal congestion and sinus trouble. I did recommend him be seen by allergist. His primary care provider can arrange this..            Consults  None  Significant Tests:  See full reports for all details      Dg Chest 2 View  07/26/2015  CLINICAL DATA:  Pneumonia. EXAM: CHEST  2 VIEW COMPARISON:  August 01, 2014 FINDINGS: Bilateral patchy pulmonary opacities are identified on the right greater than left. No other acute abnormalities or changes. IMPRESSION: Multi focal pneumonia.  Recommend follow-up to resolution. Electronically Signed   By: Dorise Bullion III M.D   On: 07/26/2015 10:09   Ct Chest Wo Contrast  07/27/2015  CLINICAL DATA:  82 year old male with history of increasing shortness of breath on exertion. History of COPD. EXAM: CT CHEST WITHOUT CONTRAST TECHNIQUE: Multidetector CT imaging of the chest was performed following the standard protocol without IV contrast.  COMPARISON:  Chest CT 04/19/2014. FINDINGS: Mediastinum/Lymph Nodes: Heart size is normal. There is no significant pericardial fluid, thickening or pericardial calcification. There is atherosclerosis of the thoracic aorta, the great vessels of the mediastinum and the coronary arteries, including calcified atherosclerotic plaque in the left main, left anterior descending, left flexed and right coronary arteries. Calcifications of the aortic valve. No pathologically enlarged mediastinal or hilar lymph nodes. Please note that accurate exclusion of hilar adenopathy is limited on noncontrast CT scans. Esophagus is unremarkable in appearance. No axillary lymphadenopathy. Lungs/Pleura: Calcified pleural plaques are noted throughout the thorax bilaterally, presumably from asbestos related pleural disease. No definite fibrotic changes in the lungs to suggest asbestosis at this time. There is airspace consolidation and ground-glass attenuation in the posterior aspect of the right upper lobe, compatible with pneumonia. Lungs otherwise appear clear. Trace left pleural effusion lying dependently is simple in appearance. No suspicious appearing pulmonary nodules or masses are noted. Upper Abdomen: Incompletely visualized exophytic lesion extending off the upper pole the left kidney measures 4.2 cm in diameter, and is similar to prior examinations, previously characterized as a simple cyst. Several low-attenuation lesions are also noted in the liver, also incompletely characterize, but unchanged compared to prior examinations, presumably cysts. Atherosclerosis. Musculoskeletal/Soft Tissues: There are no aggressive appearing lytic or blastic lesions noted in the visualized portions of the skeleton. IMPRESSION: 1. Consolidation in the posterior aspect of the right upper lobe is compatible with pneumonia. Followup PA and lateral chest X-ray is recommended in 3-4 weeks following trial of antibiotic therapy to ensure resolution. 2.  Stigmata of asbestos related pleural  disease, as above. No findings to suggest asbestosis in the lungs. 3. Trace left pleural effusion lying dependently. 4. Atherosclerosis, including left main and 3 vessel coronary artery disease. 5. There are calcifications of the aortic valve. Echocardiographic correlation for evaluation of potential valvular dysfunction may be warranted if clinically indicated. 6. Additional incidental findings, as above. Electronically Signed   By: Vinnie Langton M.D.   On: 07/27/2015 12:31       Today   Subjective:   Douglas Ramsey  feeling better breathing improved and feels like his back to his baseline     Objective:   Blood pressure 118/69, pulse 61, temperature 98.3 F (36.8 C), temperature source Oral, resp. rate 18, height 5\' 9"  (1.753 m), weight 76.295 kg (168 lb 3.2 oz), SpO2 91 %.  .  Intake/Output Summary (Last 24 hours) at 07/29/15 1223 Last data filed at 07/29/15 0900  Gross per 24 hour  Intake    960 ml  Output   1575 ml  Net   -615 ml    Exam VITAL SIGNS: Blood pressure 118/69, pulse 61, temperature 98.3 F (36.8 C), temperature source Oral, resp. rate 18, height 5\' 9"  (1.753 m), weight 76.295 kg (168 lb 3.2 oz), SpO2 91 %.  GENERAL:  82 y.o.-year-old patient lying in the bed with no acute distress.  EYES: Pupils equal, round, reactive to light and accommodation. No scleral icterus. Extraocular muscles intact.  HEENT: Head atraumatic, normocephalic. Oropharynx and nasopharynx clear.  NECK:  Supple, no jugular venous distention. No thyroid enlargement, no tenderness.  LUNGS: Normal breath sounds bilaterally, no wheezing, rales,rhonchi or crepitation. No use of accessory muscles of respiration.  CARDIOVASCULAR: S1, S2 normal. No murmurs, rubs, or gallops.  ABDOMEN: Soft, nontender, nondistended. Bowel sounds present. No organomegaly or mass.  EXTREMITIES: No pedal edema, cyanosis, or clubbing.  NEUROLOGIC: Cranial nerves II through XII are  intact. Muscle strength 5/5 in all extremities. Sensation intact. Gait not checked.  PSYCHIATRIC: The patient is alert and oriented x 3.  SKIN: No obvious rash, lesion, or ulcer.   Data Review     CBC w Diff: Lab Results  Component Value Date   WBC 19.9* 07/28/2015   WBC 14.3* 08/07/2014   HGB 13.1 07/28/2015   HGB 9.4* 08/07/2014   HCT 39.2* 07/28/2015   HCT 30.2* 08/07/2014   PLT 244 07/28/2015   PLT 272 08/10/2014   LYMPHOPCT 3 07/27/2015   LYMPHOPCT 16.0 06/04/2014   MONOPCT 4 07/27/2015   MONOPCT 8 08/07/2014   MONOPCT 13.6 06/04/2014   EOSPCT 0 07/27/2015   EOSPCT 2.7 06/04/2014   BASOPCT 0 07/27/2015   BASOPCT 1.4 06/04/2014   CMP: Lab Results  Component Value Date   NA 140 07/28/2015   NA 143 08/12/2014   K 4.8 07/28/2015   K 4.2 08/12/2014   CL 109 07/28/2015   CL 101 08/12/2014   CO2 25 07/28/2015   CO2 38* 08/12/2014   BUN 41* 07/28/2015   BUN 18 08/12/2014   CREATININE 1.29* 07/28/2015   CREATININE 0.82 08/12/2014   PROT 5.5* 05/21/2014   ALBUMIN 2.1* 05/21/2014   BILITOT 0.6 05/21/2014   ALKPHOS 66 05/21/2014   AST 34 05/21/2014   ALT 22 05/21/2014  .  Micro Results Recent Results (from the past 240 hour(s))  CULTURE, BLOOD (ROUTINE X 2) w Reflex to PCR ID Panel     Status: None   Collection Time: 07/24/15  5:27 PM  Result Value Ref Range Status  Specimen Description BLOOD RIGHT ASSIST CONTROL  Final   Special Requests BAA,ANA,AER,5ML  Final   Culture NO GROWTH 5 DAYS  Final   Report Status 07/29/2015 FINAL  Final  CULTURE, BLOOD (ROUTINE X 2) w Reflex to PCR ID Panel     Status: None   Collection Time: 07/24/15  5:35 PM  Result Value Ref Range Status   Specimen Description BLOOD RIGHT HAND  Final   Special Requests BAA,ANA,AER,  Final   Culture NO GROWTH 5 DAYS  Final   Report Status 07/29/2015 FINAL  Final        Code Status Orders        Start     Ordered   07/24/15 1529  Full code   Continuous     07/24/15 1531    Code  Status History    Date Active Date Inactive Code Status Order ID Comments User Context   This patient has a current code status but no historical code status.    Advance Directive Documentation        Most Recent Value   Type of Advance Directive  Living will, Healthcare Power of Attorney   Pre-existing out of facility DNR order (yellow form or pink MOST form)     "MOST" Form in Place?            Follow-up Information    Follow up with SPARKS,JEFFREY D, MD On 08/06/2015.   Specialty:  Internal Medicine   Why:  at 315   Contact information:   [address] [facility] [place] [address] [phone]       Discharge Medications     Medication List    TAKE these medications        amiodarone 200 MG tablet  Commonly known as:  PACERONE  Take 200 mg by mouth daily.     aspirin 81 MG tablet  Take 81 mg by mouth daily.     CALCIUM 600 + D PO  Take 1 tablet by mouth daily.     COMBIVENT 18-103 MCG/ACT inhaler  Generic drug:  albuterol-ipratropium  Inhale 2 puffs into the lungs 2 (two) times daily.     diphenoxylate-atropine 2.5-0.025 MG tablet  Commonly known as:  LOMOTIL  Take 1 tablet by mouth 4 (four) times daily as needed for diarrhea or loose stools.     doxycycline 100 MG capsule  Commonly known as:  MONODOX  Take 1 capsule (100 mg total) by mouth 2 (two) times daily.     Ferrous Sulfate 143 (45 Fe) MG Tbcr  Take by mouth daily.     FISH OIL DOUBLE STRENGTH 1200 MG Caps  Take by mouth at bedtime.     Fluticasone-Salmeterol 250-50 MCG/DOSE Aepb  Commonly known as:  ADVAIR  Inhale 1 puff into the lungs 2 (two) times daily.     glucosamine-chondroitin 500-400 MG tablet  Take 1 tablet by mouth daily.     isosorbide mononitrate 30 MG 24 hr tablet  Commonly known as:  IMDUR  Take 1 tablet by mouth daily.     multivitamin tablet  Take 1 tablet by mouth daily.     predniSONE 10 MG (21) Tbpk tablet  Commonly known as:  STERAPRED  UNI-PAK 21 TAB  Take 1 tablet (10 mg total) by mouth daily.     simvastatin 20 MG tablet  Commonly known as:  ZOCOR  Take 20 mg by mouth daily.     VENTOLIN HFA 108 (  90 Base) MCG/ACT inhaler  Generic drug:  albuterol  Inhale 1-2 puffs into the lungs every 4 (four) hours as needed.     vitamin B-12 100 MCG tablet  Commonly known as:  CYANOCOBALAMIN  Take 100 mcg by mouth daily.     vitamin C 100 MG tablet  Take 100 mg by mouth daily.           Total Time in preparing paper work, data evaluation and todays exam - 35 minutes  Dustin Flock M.D on 07/29/2015 at 12:23 PM  [facility]   Office  [phone]

## 2015-07-29 NOTE — Discharge Instructions (Signed)

## 2015-07-29 NOTE — Care Management Important Message (Signed)
Important Message  Patient Details  Name: Douglas Ramsey MRN: [id number] Date of Birth: [birth year]   Medicare Important Message Given:  Yes    Veronique Warga A, RN 07/29/2015, 9:11 AM

## 2015-08-05 ENCOUNTER — Inpatient Hospital Stay: Payer: Commercial Managed Care - HMO

## 2015-08-05 ENCOUNTER — Inpatient Hospital Stay: Payer: Commercial Managed Care - HMO | Admitting: Oncology

## 2015-08-05 ENCOUNTER — Inpatient Hospital Stay: Payer: Commercial Managed Care - HMO | Attending: Oncology

## 2015-08-05 ENCOUNTER — Inpatient Hospital Stay (HOSPITAL_BASED_OUTPATIENT_CLINIC_OR_DEPARTMENT_OTHER): Payer: Commercial Managed Care - HMO | Admitting: Oncology

## 2015-08-05 VITALS — BP 114/71 | HR 86 | Temp 97.7°F | Resp 17 | Ht 69.0 in | Wt 156.5 lb

## 2015-08-05 DIAGNOSIS — D72829 Elevated white blood cell count, unspecified: Secondary | ICD-10-CM

## 2015-08-05 DIAGNOSIS — K219 Gastro-esophageal reflux disease without esophagitis: Secondary | ICD-10-CM | POA: Insufficient documentation

## 2015-08-05 DIAGNOSIS — Z79899 Other long term (current) drug therapy: Secondary | ICD-10-CM | POA: Insufficient documentation

## 2015-08-05 DIAGNOSIS — Z955 Presence of coronary angioplasty implant and graft: Secondary | ICD-10-CM | POA: Insufficient documentation

## 2015-08-05 DIAGNOSIS — D509 Iron deficiency anemia, unspecified: Secondary | ICD-10-CM | POA: Diagnosis not present

## 2015-08-05 DIAGNOSIS — Z7982 Long term (current) use of aspirin: Secondary | ICD-10-CM | POA: Diagnosis not present

## 2015-08-05 DIAGNOSIS — I2510 Atherosclerotic heart disease of native coronary artery without angina pectoris: Secondary | ICD-10-CM | POA: Insufficient documentation

## 2015-08-05 DIAGNOSIS — C189 Malignant neoplasm of colon, unspecified: Secondary | ICD-10-CM | POA: Insufficient documentation

## 2015-08-05 LAB — CBC WITH DIFFERENTIAL/PLATELET
BASOS ABS: 0 10*3/uL (ref 0–0.1)
Basophils Relative: 0 %
EOS PCT: 1 %
Eosinophils Absolute: 0.2 10*3/uL (ref 0–0.7)
HCT: 44 % (ref 40.0–52.0)
Hemoglobin: 14.4 g/dL (ref 13.0–18.0)
LYMPHS ABS: 1.7 10*3/uL (ref 1.0–3.6)
Lymphocytes Relative: 9 %
MCH: 28.5 pg (ref 26.0–34.0)
MCHC: 32.7 g/dL (ref 32.0–36.0)
MCV: 87.2 fL (ref 80.0–100.0)
MONO ABS: 2.3 10*3/uL — AB (ref 0.2–1.0)
Monocytes Relative: 12 %
Neutro Abs: 15.1 10*3/uL — ABNORMAL HIGH (ref 1.4–6.5)
Neutrophils Relative %: 78 %
PLATELETS: 268 10*3/uL (ref 150–440)
RBC: 5.05 MIL/uL (ref 4.40–5.90)
RDW: 16.7 % — ABNORMAL HIGH (ref 11.5–14.5)
WBC: 19.3 10*3/uL — AB (ref 3.8–10.6)

## 2015-08-05 NOTE — Progress Notes (Signed)
In general feels weak and gets tired easily.  Pneumonia on 3/9 per pt

## 2015-08-06 DIAGNOSIS — J189 Pneumonia, unspecified organism: Secondary | ICD-10-CM | POA: Diagnosis not present

## 2015-08-06 DIAGNOSIS — J441 Chronic obstructive pulmonary disease with (acute) exacerbation: Secondary | ICD-10-CM | POA: Diagnosis not present

## 2015-08-06 DIAGNOSIS — D649 Anemia, unspecified: Secondary | ICD-10-CM | POA: Diagnosis not present

## 2015-08-06 DIAGNOSIS — R0602 Shortness of breath: Secondary | ICD-10-CM | POA: Diagnosis not present

## 2015-08-06 DIAGNOSIS — Z79899 Other long term (current) drug therapy: Secondary | ICD-10-CM | POA: Diagnosis not present

## 2015-08-06 LAB — CEA: CEA: 3.2 ng/mL (ref 0.0–4.7)

## 2015-08-18 NOTE — Progress Notes (Signed)
[name]  [phone] [phone]  ID: Douglas Ramsey OB: Nov 15, 1918  MR#: [id number]  [id number]  Patient Care Team: Idelle Crouch, MD as PCP - General (Internal Medicine)  CHIEF COMPLAINT:  Chief Complaint  Patient presents with  . Follow-up    Colon Cancer    INTERVAL HISTORY: Patient returns to clinic today for routine six-month follow-up and further evaluation. He Has multiple complaints today. He was recently admitted to the hospital in March for a bout of pneumonia. He feels persistently weak and fatigued. He has no neurologic complaints. He denies any fevers. He has a good appetite and denies weight loss. He denies any chest pain or shortness of breath. He denies any nausea, vomiting, constipation, or diarrhea. He has no melena or hematochezia. Patient offers no further specific complaints today.  REVIEW OF SYSTEMS:   Review of Systems  Constitutional: Positive for malaise/fatigue. Negative for fever and weight loss.  Respiratory: Negative.   Cardiovascular: Negative.   Gastrointestinal: Negative.  Negative for abdominal pain, blood in stool and melena.  Musculoskeletal: Negative.   Neurological: Positive for weakness.    As per HPI. Otherwise, a complete review of systems is negatve.  PAST MEDICAL HISTORY:  GERD, CAD status post stent.  PAST SURGICAL HISTORY:  Partial colon resection, partial small bowel resection, cataract surgery.  FAMILY HISTORY: Reviewed and unchanged. No reported history of malignancy or chronic disease.     ADVANCED DIRECTIVES:    HEALTH MAINTENANCE: Social History  Substance Use Topics  . Smoking status: Never Smoker   . Smokeless tobacco: Never Used  . Alcohol Use: No     Colonoscopy:  PAP:  Bone density:  Lipid panel:  No Known Allergies  Current Outpatient Prescriptions  Medication Sig Dispense Refill  . albuterol-ipratropium (COMBIVENT) 18-103 MCG/ACT inhaler Inhale 2 puffs  into the lungs 2 (two) times daily.    Marland Kitchen amiodarone (PACERONE) 200 MG tablet Take 200 mg by mouth daily.    . Ascorbic Acid (VITAMIN C) 100 MG tablet Take 100 mg by mouth daily.    Marland Kitchen aspirin 81 MG tablet Take 81 mg by mouth daily.    . Calcium Carb-Cholecalciferol (CALCIUM 600 + D PO) Take 1 tablet by mouth daily.    . diphenoxylate-atropine (LOMOTIL) 2.5-0.025 MG per tablet Take 1 tablet by mouth 4 (four) times daily as needed for diarrhea or loose stools.    . Ferrous Sulfate 143 (45 FE) MG TBCR Take by mouth daily.    . Fluticasone-Salmeterol (ADVAIR) 250-50 MCG/DOSE AEPB Inhale 1 puff into the lungs 2 (two) times daily.    Marland Kitchen glucosamine-chondroitin 500-400 MG tablet Take 1 tablet by mouth daily.    . isosorbide mononitrate (IMDUR) 30 MG 24 hr tablet Take 1 tablet by mouth daily.    . Multiple Vitamin (MULTIVITAMIN) tablet Take 1 tablet by mouth daily.    . Omega-3 Fatty Acids (FISH OIL DOUBLE STRENGTH) 1200 MG CAPS Take by mouth at bedtime.    . predniSONE (STERAPRED UNI-PAK 21 TAB) 10 MG (21) TBPK tablet Take 1 tablet (10 mg total) by mouth daily. 21 tablet 0  . simvastatin (ZOCOR) 20 MG tablet Take 20 mg by mouth daily.    . VENTOLIN HFA 108 (90 Base) MCG/ACT inhaler Inhale 1-2 puffs into the lungs every 4 (four) hours as needed.    . vitamin B-12 (CYANOCOBALAMIN) 100 MCG tablet Take 100 mcg by mouth daily.     No current facility-administered medications for this  visit.    OBJECTIVE: Filed Vitals:   08/05/15 1618  BP: 114/71  Pulse: 86  Temp: 97.7 F (36.5 C)  Resp: 17     Body mass index is 23.1 kg/(m^2).    ECOG FS:1 - Symptomatic but completely ambulatory  General: Well-developed, well-nourished, no acute distress. Eyes: Pink conjunctiva, anicteric sclera. Lungs: Clear to auscultation bilaterally. Heart: Regular rate and rhythm. No rubs, murmurs, or gallops. Abdomen: Soft, nontender, nondistended. No organomegaly noted, normoactive bowel sounds. Musculoskeletal: No edema,  cyanosis, or clubbing. Neuro: Alert, answering all questions appropriately. Cranial nerves grossly intact. Skin: No rashes or petechiae noted. Psych: Normal affect.  LAB RESULTS:  Lab Results  Component Value Date   NA 140 07/28/2015   K 4.8 07/28/2015   CL 109 07/28/2015   CO2 25 07/28/2015   GLUCOSE 129* 07/28/2015   BUN 41* 07/28/2015   CREATININE 1.29* 07/28/2015   CALCIUM 8.5* 07/28/2015   PROT 5.5* 05/21/2014   ALBUMIN 2.1* 05/21/2014   AST 34 05/21/2014   ALT 22 05/21/2014   ALKPHOS 66 05/21/2014   BILITOT 0.6 05/21/2014   GFRNONAA 46* 07/28/2015   GFRAA 53* 07/28/2015    Lab Results  Component Value Date   WBC 19.3* 08/05/2015   NEUTROABS 15.1* 08/05/2015   HGB 14.4 08/05/2015   HCT 44.0 08/05/2015   MCV 87.2 08/05/2015   PLT 268 08/05/2015   Lab Results  Component Value Date   CEA 3.2 08/05/2015    STUDIES: Dg Chest 2 View  07/26/2015  CLINICAL DATA:  Pneumonia. EXAM: CHEST  2 VIEW COMPARISON:  August 01, 2014 FINDINGS: Bilateral patchy pulmonary opacities are identified on the right greater than left. No other acute abnormalities or changes. IMPRESSION: Multi focal pneumonia.  Recommend follow-up to resolution. Electronically Signed   By: Dorise Bullion III M.D   On: 07/26/2015 10:09   Ct Chest Wo Contrast  07/27/2015  CLINICAL DATA:  82 year old male with history of increasing shortness of breath on exertion. History of COPD. EXAM: CT CHEST WITHOUT CONTRAST TECHNIQUE: Multidetector CT imaging of the chest was performed following the standard protocol without IV contrast. COMPARISON:  Chest CT 04/19/2014. FINDINGS: Mediastinum/Lymph Nodes: Heart size is normal. There is no significant pericardial fluid, thickening or pericardial calcification. There is atherosclerosis of the thoracic aorta, the great vessels of the mediastinum and the coronary arteries, including calcified atherosclerotic plaque in the left main, left anterior descending, left flexed and right  coronary arteries. Calcifications of the aortic valve. No pathologically enlarged mediastinal or hilar lymph nodes. Please note that accurate exclusion of hilar adenopathy is limited on noncontrast CT scans. Esophagus is unremarkable in appearance. No axillary lymphadenopathy. Lungs/Pleura: Calcified pleural plaques are noted throughout the thorax bilaterally, presumably from asbestos related pleural disease. No definite fibrotic changes in the lungs to suggest asbestosis at this time. There is airspace consolidation and ground-glass attenuation in the posterior aspect of the right upper lobe, compatible with pneumonia. Lungs otherwise appear clear. Trace left pleural effusion lying dependently is simple in appearance. No suspicious appearing pulmonary nodules or masses are noted. Upper Abdomen: Incompletely visualized exophytic lesion extending off the upper pole the left kidney measures 4.2 cm in diameter, and is similar to prior examinations, previously characterized as a simple cyst. Several low-attenuation lesions are also noted in the liver, also incompletely characterize, but unchanged compared to prior examinations, presumably cysts. Atherosclerosis. Musculoskeletal/Soft Tissues: There are no aggressive appearing lytic or blastic lesions noted in the visualized portions of the skeleton.  IMPRESSION: 1. Consolidation in the posterior aspect of the right upper lobe is compatible with pneumonia. Followup PA and lateral chest X-ray is recommended in 3-4 weeks following trial of antibiotic therapy to ensure resolution. 2. Stigmata of asbestos related pleural disease, as above. No findings to suggest asbestosis in the lungs. 3. Trace left pleural effusion lying dependently. 4. Atherosclerosis, including left main and 3 vessel coronary artery disease. 5. There are calcifications of the aortic valve. Echocardiographic correlation for evaluation of potential valvular dysfunction may be warranted if clinically  indicated. 6. Additional incidental findings, as above. Electronically Signed   By: Vinnie Langton M.D.   On: 07/27/2015 12:31    ASSESSMENT: Stage IIIB adenocarcinoma of the colon.  PLAN:    1. Colon cancer: CT results reported in December 2015 revealed no obvious metastatic disease. CEA is 3.2 today. Despite patient's stage of disease, no chemotherapy was given secondary to his advanced age. No intervention is needed at this time. Patient initially refused any further follow-up, but then subsequently change his mind and agree to return to clinic in 6 months for repeat laboratory work and further evaluation.   2. Iron deficiency anemia: Patient's hemoglobin and iron stores are now within normal limits. Return to clinic in 6 months as above.  3. Leukocytosis: Likely reactive, monitor.   Patient expressed understanding and was in agreement with this plan. He also understands that He can call clinic at any time with any questions, concerns, or complaints.   Lloyd Huger, MD   08/18/2015 8:24 AM

## 2015-08-19 DIAGNOSIS — D72829 Elevated white blood cell count, unspecified: Secondary | ICD-10-CM | POA: Diagnosis not present

## 2015-08-21 DIAGNOSIS — J189 Pneumonia, unspecified organism: Secondary | ICD-10-CM | POA: Diagnosis not present

## 2015-08-21 DIAGNOSIS — J449 Chronic obstructive pulmonary disease, unspecified: Secondary | ICD-10-CM | POA: Diagnosis not present

## 2015-09-12 DIAGNOSIS — R58 Hemorrhage, not elsewhere classified: Secondary | ICD-10-CM | POA: Diagnosis not present

## 2015-09-12 DIAGNOSIS — I2510 Atherosclerotic heart disease of native coronary artery without angina pectoris: Secondary | ICD-10-CM | POA: Diagnosis not present

## 2015-09-12 DIAGNOSIS — E782 Mixed hyperlipidemia: Secondary | ICD-10-CM | POA: Diagnosis not present

## 2015-09-12 DIAGNOSIS — R0602 Shortness of breath: Secondary | ICD-10-CM | POA: Diagnosis not present

## 2015-10-10 ENCOUNTER — Ambulatory Visit (INDEPENDENT_AMBULATORY_CARE_PROVIDER_SITE_OTHER): Payer: Commercial Managed Care - HMO | Admitting: Family Medicine

## 2015-10-10 VITALS — BP 108/62 | HR 77 | Temp 97.8°F | Ht 71.0 in | Wt 163.1 lb

## 2015-10-10 DIAGNOSIS — N183 Chronic kidney disease, stage 3 unspecified: Secondary | ICD-10-CM | POA: Insufficient documentation

## 2015-10-10 DIAGNOSIS — K4090 Unilateral inguinal hernia, without obstruction or gangrene, not specified as recurrent: Secondary | ICD-10-CM

## 2015-10-10 DIAGNOSIS — R0602 Shortness of breath: Secondary | ICD-10-CM

## 2015-10-10 DIAGNOSIS — J310 Chronic rhinitis: Secondary | ICD-10-CM | POA: Diagnosis not present

## 2015-10-10 DIAGNOSIS — I2510 Atherosclerotic heart disease of native coronary artery without angina pectoris: Secondary | ICD-10-CM | POA: Diagnosis not present

## 2015-10-10 DIAGNOSIS — K432 Incisional hernia without obstruction or gangrene: Secondary | ICD-10-CM | POA: Insufficient documentation

## 2015-10-10 DIAGNOSIS — J300 Vasomotor rhinitis: Secondary | ICD-10-CM | POA: Insufficient documentation

## 2015-10-10 DIAGNOSIS — E782 Mixed hyperlipidemia: Secondary | ICD-10-CM

## 2015-10-10 MED ORDER — NITROGLYCERIN 0.4 MG SL SUBL: 0.4000 mg | SUBLINGUAL_TABLET | SUBLINGUAL | Status: DC | PRN

## 2015-10-10 MED ORDER — IPRATROPIUM BROMIDE 0.03 % NA SOLN: 2.0000 | Freq: Three times a day (TID) | NASAL | Status: DC

## 2015-10-10 NOTE — Assessment & Plan Note (Signed)
New problem (to me). Treating with ipratropium.

## 2015-10-10 NOTE — Assessment & Plan Note (Signed)
Well controlled on Zocor. Will continue.

## 2015-10-10 NOTE — Progress Notes (Signed)
Subjective:  Patient ID: Douglas Ramsey, male    DOB: 1919-03-15  Age: 82 y.o. MRN: [id number]  CC: Establish care  HPI Douglas Ramsey is a 82 y.o. male presents to the clinic today to establish care. He complains of SOB. Issues outlined below.  CAD  Stable.  No recent chest pain.  Patient does report shortness of breath. See below.  Followed by cardiology.  SOB  Patient reports shortness of breath with significant exertion. He states that after he walks a block he has to stop and rest.  No recent chest pain.  He follows closely with cardiology and pulmonology. His last cardiology note reflects that no further intervention was needed as this was likely multifactorial in nature.  No reports of cough.  No reports of PND or orthopnea.  No known relieving factors.   He endorses compliance with his COPD medications.  HLD  Well controlled.  Patient tolerating Zocor without difficulty.  Runny nose  Patient reports long-standing issues with runny nose particular after eating.  He states that he's tried numerous things for this per his previous physician.  He's had no relief.  No associated sinus pressure or congestion. No fevers or chills.  No known exacerbating factors.  Hernias  Patient and his wife state that he has 2 hernias.  He states that one of them bothersome as it interferes with putting his belt on.  No associated symptoms.  They have been there for quite some time.  He would like to discuss treatment or intervention.  PMH, Surgical Hx, Family Hx, Social History reviewed and updated as below.  Past Medical History  Diagnosis Date  . CAD (coronary artery disease)     s/p MI and PCI  . Colon cancer ([place])   . Rhinitis   . COPD (chronic obstructive pulmonary disease) ([place])   . Anemia   . Hyperlipidemia   . CKD (chronic kidney disease)    Past Surgical History  Procedure Laterality Date  . Colon surgery      ostomy (reversed in  december)  . Coronary angioplasty  02/2007  . Colonoscopy  05/13/2004  . Flexible sigmoidoscopy  05/14/2014  . Esophagogastroduodenoscopy  05/11/2014   Family History  Problem Relation Age of Onset  . Heart disease Father   . Heart disease Brother    Social History  Substance Use Topics  . Smoking status: Never Smoker   . Smokeless tobacco: Never Used  . Alcohol Use: No   Review of Systems  Respiratory: Positive for shortness of breath.   Genitourinary: Positive for frequency.  All other systems reviewed and are negative.  Objective:   Today's Vitals: BP 108/62 mmHg  Pulse 77  Temp(Src) 97.8 F (36.6 C) (Oral)  Ht 5\' 11"  (1.803 m)  Wt 163 lb 2 oz (73.993 kg)  BMI 22.76 kg/m2  SpO2 94%  Physical Exam  Constitutional: He appears well-developed. No distress.  HENT:  Head: Normocephalic and atraumatic.  Mouth/Throat: Oropharynx is clear and moist.  Eyes: Conjunctivae are normal. No scleral icterus.  Neck: Neck supple.  Cardiovascular: Normal rate and regular rhythm.   Pulmonary/Chest: Effort normal. He has no wheezes. He has no rales.  Abdominal: Soft. He exhibits no distension. There is no tenderness. A hernia is present. Hernia confirmed positive in the right inguinal area.  Right inguinal hernia noted. Could not reduce. Midline incisional hernia noted. Reproducible.  Genitourinary: Penis normal.  Musculoskeletal: He exhibits no tenderness.  Lymphadenopathy:    He has  no cervical adenopathy.  Neurological: He is alert.  Skin: Skin is warm and dry. No rash noted.  Psychiatric: He has a normal mood and affect.  Vitals reviewed.  Assessment & Plan:   Problem List Items Addressed This Visit    SOB (shortness of breath)    New problem. Patient endorses slow worsening of his shortness of breath. This does not appear to be limiting him at all as he is exerting himself quite a bit. He is gardening and mowing grass and walking. I informed him that this is secondary  to age, deconditioning, and underlying COPD. No need for further workup this time.      Right inguinal hernia    I discussed the fact that given his age and comorbidities, the risk of surgery seems to outweigh the benefit. I offered referral to general surgery for discussion. They elected to wait. Patient possibly benefit from a truss. However it doesn't seem to bother him very much.      Mixed hyperlipidemia    Well controlled on Zocor. Will continue.      Relevant Medications   nitroGLYCERIN (NITROSTAT) 0.4 MG SL tablet   Incisional hernia    Midline incisional hernia. I discussed the fact that given his age and comorbidities, the risk of surgery seems to outweigh the benefit. I offered referral to general surgery for discussion. They elected to wait.      Gustatory rhinitis    New problem (to me). Treating with ipratropium.      CAD (coronary artery disease) - Primary    Stable.  No recent chest pain. Followed by cardiology. Continues on aspirin and Imdur. Requesting nitroglycerin. Will send.      Relevant Medications   nitroGLYCERIN (NITROSTAT) 0.4 MG SL tablet      Outpatient Encounter Prescriptions as of 10/10/2015  Medication Sig  . albuterol-ipratropium (COMBIVENT) 18-103 MCG/ACT inhaler Inhale 2 puffs into the lungs 2 (two) times daily.  . Ascorbic Acid (VITAMIN C) 100 MG tablet Take 100 mg by mouth daily.  Marland Kitchen aspirin 81 MG tablet Take 81 mg by mouth daily.  . Calcium Carb-Cholecalciferol (CALCIUM 600 + D PO) Take 1 tablet by mouth daily.  . diphenoxylate-atropine (LOMOTIL) 2.5-0.025 MG per tablet Take 1 tablet by mouth 4 (four) times daily as needed for diarrhea or loose stools.  . Ferrous Sulfate 143 (45 FE) MG TBCR Take by mouth daily.  . Fluticasone-Salmeterol (ADVAIR) 250-50 MCG/DOSE AEPB Inhale 1 puff into the lungs 2 (two) times daily.  Marland Kitchen glucosamine-chondroitin 500-400 MG tablet Take 1 tablet by mouth daily.  . isosorbide mononitrate (IMDUR) 30 MG 24 hr  tablet Take 1 tablet by mouth daily.  . Multiple Vitamin (MULTIVITAMIN) tablet Take 1 tablet by mouth daily.  . Omega-3 Fatty Acids (FISH OIL DOUBLE STRENGTH) 1200 MG CAPS Take by mouth at bedtime.  . simvastatin (ZOCOR) 20 MG tablet Take 20 mg by mouth daily.  . VENTOLIN HFA 108 (90 Base) MCG/ACT inhaler Inhale 1-2 puffs into the lungs every 4 (four) hours as needed.  . vitamin B-12 (CYANOCOBALAMIN) 100 MCG tablet Take 100 mcg by mouth daily.  . [DISCONTINUED] amiodarone (PACERONE) 200 MG tablet Take 200 mg by mouth daily.  Marland Kitchen ipratropium (ATROVENT) 0.03 % nasal spray Place 2 sprays into both nostrils 3 (three) times daily.  . nitroGLYCERIN (NITROSTAT) 0.4 MG SL tablet Place 1 tablet (0.4 mg total) under the tongue every 5 (five) minutes as needed for chest pain.  . Vitamins A & D (  VITAMIN A & D) 10000-400 units TABS   . [DISCONTINUED] predniSONE (STERAPRED UNI-PAK 21 TAB) 10 MG (21) TBPK tablet Take 1 tablet (10 mg total) by mouth daily.   No facility-administered encounter medications on file as of 10/10/2015.    Follow-up: Return in about 6 months (around 04/11/2016).  [place]

## 2015-10-10 NOTE — Assessment & Plan Note (Addendum)
Stable.  No recent chest pain. Followed by cardiology. Continues on aspirin and Imdur. Requesting nitroglycerin. Will send.

## 2015-10-10 NOTE — Assessment & Plan Note (Signed)
Midline incisional hernia. I discussed the fact that given his age and comorbidities, the risk of surgery seems to outweigh the benefit. I offered referral to general surgery for discussion. They elected to wait.

## 2015-10-10 NOTE — Assessment & Plan Note (Signed)
I discussed the fact that given his age and comorbidities, the risk of surgery seems to outweigh the benefit. I offered referral to general surgery for discussion. They elected to wait. Patient possibly benefit from a truss. However it doesn't seem to bother him very much.

## 2015-10-10 NOTE — Patient Instructions (Signed)
Continue your current medications.  Follow up in 6 months.   Use the nasal spray for your runny nose.  Take care  Dr. Lacinda Axon

## 2015-10-10 NOTE — Assessment & Plan Note (Signed)
New problem. Patient endorses slow worsening of his shortness of breath. This does not appear to be limiting him at all as he is exerting himself quite a bit. He is gardening and mowing grass and walking. I informed him that this is secondary to age, deconditioning, and underlying COPD. No need for further workup this time.

## 2015-10-23 ENCOUNTER — Telehealth: Payer: Self-pay | Admitting: *Deleted

## 2015-10-23 ENCOUNTER — Ambulatory Visit (INDEPENDENT_AMBULATORY_CARE_PROVIDER_SITE_OTHER): Payer: Commercial Managed Care - HMO | Admitting: Family Medicine

## 2015-10-23 ENCOUNTER — Encounter: Payer: Self-pay | Admitting: Family Medicine

## 2015-10-23 VITALS — BP 106/68 | HR 65 | Temp 97.6°F | Ht 71.0 in | Wt 162.4 lb

## 2015-10-23 DIAGNOSIS — J441 Chronic obstructive pulmonary disease with (acute) exacerbation: Secondary | ICD-10-CM | POA: Diagnosis not present

## 2015-10-23 DIAGNOSIS — I714 Abdominal aortic aneurysm, without rupture, unspecified: Secondary | ICD-10-CM | POA: Insufficient documentation

## 2015-10-23 MED ORDER — DOXYCYCLINE HYCLATE 100 MG PO TABS: 100.0000 mg | ORAL_TABLET | Freq: Two times a day (BID) | ORAL | Status: DC

## 2015-10-23 MED ORDER — PREDNISONE 50 MG PO TABS: ORAL_TABLET | ORAL | Status: DC

## 2015-10-23 NOTE — Patient Instructions (Signed)
Take the prednisone and doxycycline as prescribed.  We will call with your referral.  Take care  Dr. Lacinda Axon    Abdominal Aortic Aneurysm An aneurysm is a weakened or damaged part of an artery wall that bulges from the normal force of blood pumping through the body. An abdominal aortic aneurysm is an aneurysm that occurs in the lower part of the aorta, the main artery of the body.  The major concern with an abdominal aortic aneurysm is that it can enlarge and burst (rupture) or blood can flow between the layers of the wall of the aorta through a tear (aorticdissection). Both of these conditions can cause bleeding inside the body and can be life threatening unless diagnosed and treated promptly. CAUSES  The exact cause of an abdominal aortic aneurysm is unknown. Some contributing factors are:   A hardening of the arteries caused by the buildup of fat and other substances in the lining of a blood vessel (arteriosclerosis).  Inflammation of the walls of an artery (arteritis).   Connective tissue diseases, such as Marfan syndrome.   Abdominal trauma.   An infection, such as syphilis or staphylococcus, in the wall of the aorta (infectious aortitis) caused by bacteria. RISK FACTORS  Risk factors that contribute to an abdominal aortic aneurysm may include:  Age older than 92 years.   High blood pressure (hypertension).  Male gender.  Ethnicity (white race).  Obesity.  Family history of aneurysm (first degree relatives only).  Tobacco use. PREVENTION  The following healthy lifestyle habits may help decrease your risk of abdominal aortic aneurysm:  Quitting smoking. Smoking can raise your blood pressure and cause arteriosclerosis.  Limiting or avoiding alcohol.  Keeping your blood pressure, blood sugar level, and cholesterol levels within normal limits.  Decreasing your salt intake. In somepeople, too much salt can raise blood pressure and increase your risk of abdominal  aortic aneurysm.  Eating a diet low in saturated fats and cholesterol.  Increasing your fiber intake by including whole grains, vegetables, and fruits in your diet. Eating these foods may help lower blood pressure.  Maintaining a healthy weight.  Staying physically active and exercising regularly. SYMPTOMS  The symptoms of abdominal aortic aneurysm may vary depending on the size and rate of growth of the aneurysm.Most grow slowly and do not have any symptoms. When symptoms do occur, they may include:  Pain (abdomen, side, lower back, or groin). The pain may vary in intensity. A sudden onset of severe pain may indicate that the aneurysm has ruptured.  Feeling full after eating only small amounts of food.  Nausea or vomiting or both.  Feeling a pulsating lump in the abdomen.  Feeling faint or passing out. DIAGNOSIS  Since most unruptured abdominal aortic aneurysms have no symptoms, they are often discovered during diagnostic exams for other conditions. An aneurysm may be found during the following procedures:  Ultrasonography (A one-time screening for abdominal aortic aneurysm by ultrasonography is also recommended for all men aged 22-75 years who have ever smoked).  X-ray exams.  A computed tomography (CT).  Magnetic resonance imaging (MRI).  Angiography or arteriography. TREATMENT  Treatment of an abdominal aortic aneurysm depends on the size of your aneurysm, your age, and risk factors for rupture. Medication to control blood pressure and pain may be used to manage aneurysms smaller than 6 cm. Regular monitoring for enlargement may be recommended by your caregiver if:  The aneurysm is 3-4 cm in size (an annual ultrasonography may be recommended).  The  aneurysm is 4-4.5 cm in size (an ultrasonography every 6 months may be recommended).  The aneurysm is larger than 4.5 cm in size (your caregiver may ask that you be examined by a vascular surgeon). If your aneurysm is larger  than 6 cm, surgical repair may be recommended. There are two main methods for repair of an aneurysm:   Endovascular repair (a minimally invasive surgery). This is done most often.  Open repair. This method is used if an endovascular repair is not possible.   This information is not intended to replace advice given to you by your health care provider. Make sure you discuss any questions you have with your health care provider.   Document Released: 02/10/2005 Document Revised: 08/28/2012 Document Reviewed: 06/02/2012 Elsevier Interactive Patient Education [employer].

## 2015-10-23 NOTE — Assessment & Plan Note (Signed)
Patient appears to be having a COPD exacerbation. Treating with prednisone and doxycycline.

## 2015-10-23 NOTE — Progress Notes (Signed)
Subjective:  Patient ID: Douglas Ramsey, male    DOB: Sep 14, 1918  Age: 82 y.o. MRN: [id number]  CC: Cough, AAA  HPI:  82 year old male presents with the above issues.  Patient reports he's had a cough since 5/26. Cough is productive of discolored sputum. He reports associated shortness of breath. He does have baseline shortness of breath particularly with significant exertion. He endorses compliance with his inhalers. No associated fevers or chills. He states that he's had some improvement with his inhalers. No known exacerbating factors.  Patient's wife informed me that he has has a history of AAA. She states this was noted on a CT scan. She states she is unsure of the size of the AAA but states that it was large. They are unsure whether he seen a vascular surgeon or not. They would like to discuss this today.  Social Hx   Social History   Social History  . Marital Status: Married    Spouse Name: N/A  . Number of Children: N/A  . Years of Education: N/A   Social History Main Topics  . Smoking status: Never Smoker   . Smokeless tobacco: Never Used  . Alcohol Use: No  . Drug Use: No  . Sexual Activity: No   Other Topics Concern  . None   Social History Narrative   Review of Systems  Constitutional: Negative for fever and chills.  Respiratory: Positive for cough and shortness of breath.   Cardiovascular: Negative.    Objective:  BP 106/68 mmHg  Pulse 65  Temp(Src) 97.6 F (36.4 C) (Oral)  Ht 5\' 11"  (1.803 m)  Wt 162 lb 6 oz (73.653 kg)  BMI 22.66 kg/m2  SpO2 95%  BP/Weight 10/23/2015 10/10/2015 [id number]  Systolic BP [id number]  Diastolic BP 68 62 71  Wt. (Lbs) 162.38 163.13 156.53  BMI 22.66 22.76 23.1   Physical Exam  Constitutional: He is oriented to person, place, and time.  Well appearing elderly male in no acute distress.  Cardiovascular: Normal rate and regular rhythm.   Pulmonary/Chest: Effort normal.  Scattered wheezing noted.  Neurological: He is  alert and oriented to person, place, and time.  Psychiatric: He has a normal mood and affect.  Vitals reviewed.  Lab Results  Component Value Date   WBC 19.3* 08/05/2015   HGB 14.4 08/05/2015   HCT 44.0 08/05/2015   PLT 268 08/05/2015   GLUCOSE 129* 07/28/2015   ALT 22 05/21/2014   AST 34 05/21/2014   NA 140 07/28/2015   K 4.8 07/28/2015   CL 109 07/28/2015   CREATININE 1.29* 07/28/2015   BUN 41* 07/28/2015   CO2 25 07/28/2015   INR 1.1 05/16/2014   Assessment & Plan:   Problem List Items Addressed This Visit    AAA (abdominal aortic aneurysm) without rupture ([place])    New problem (to me).  CT abdomen and pelvis from March 2016 was reviewed today. AAA was noted. Size 4.9 x 5.5 cm. Sending to Vascular to discuss surgery.      Relevant Orders   Ambulatory referral to Vascular Surgery   COPD exacerbation [facility]) - Primary    Patient appears to be having a COPD exacerbation. Treating with prednisone and doxycycline.      Relevant Medications   predniSONE (DELTASONE) 50 MG tablet      Meds ordered this encounter  Medications  . predniSONE (DELTASONE) 50 MG tablet    Sig: 1 tablet daily for 5 days.    Dispense:  5 tablet    Refill:  0  . doxycycline (VIBRA-TABS) 100 MG tablet    Sig: Take 1 tablet (100 mg total) by mouth 2 (two) times daily.    Dispense:  20 tablet    Refill:  0    Follow-up: PRN  [place]

## 2015-10-23 NOTE — Assessment & Plan Note (Signed)
New problem (to me).  CT abdomen and pelvis from March 2016 was reviewed today. AAA was noted. Size 4.9 x 5.5 cm. Sending to Vascular to discuss surgery.

## 2015-10-23 NOTE — Progress Notes (Signed)
Pre visit review using our clinic review tool, if applicable. No additional management support is needed unless otherwise documented below in the visit note. 

## 2015-10-23 NOTE — Telephone Encounter (Signed)
Patient's wife questioned if she should make another appt to com see Dr. Lacinda Axon before going to the vascular surgeon. Patients wife was concerned that the referral date was to far off.

## 2015-10-24 NOTE — Telephone Encounter (Signed)
I have spoken to [facility] who is addressing this to arrange for sooner appt.

## 2015-10-30 ENCOUNTER — Encounter: Payer: Commercial Managed Care - HMO | Admitting: Vascular Surgery

## 2015-11-04 DIAGNOSIS — I10 Essential (primary) hypertension: Secondary | ICD-10-CM | POA: Diagnosis not present

## 2015-11-04 DIAGNOSIS — D509 Iron deficiency anemia, unspecified: Secondary | ICD-10-CM | POA: Diagnosis not present

## 2015-11-04 DIAGNOSIS — E785 Hyperlipidemia, unspecified: Secondary | ICD-10-CM | POA: Diagnosis not present

## 2015-11-04 DIAGNOSIS — I714 Abdominal aortic aneurysm, without rupture: Secondary | ICD-10-CM | POA: Diagnosis not present

## 2015-11-04 DIAGNOSIS — J449 Chronic obstructive pulmonary disease, unspecified: Secondary | ICD-10-CM | POA: Diagnosis not present

## 2015-11-26 ENCOUNTER — Other Ambulatory Visit: Payer: Self-pay | Admitting: Family Medicine

## 2015-11-26 ENCOUNTER — Telehealth: Payer: Self-pay | Admitting: *Deleted

## 2015-11-26 DIAGNOSIS — J449 Chronic obstructive pulmonary disease, unspecified: Secondary | ICD-10-CM

## 2015-11-26 NOTE — Telephone Encounter (Signed)
thanks

## 2015-11-26 NOTE — Telephone Encounter (Signed)
Patient daughter has requested to have a recommendation to pulmonologist  [phone] -Benjamine Mola

## 2015-11-26 NOTE — Telephone Encounter (Signed)
Please advise for Pulmonary referral, thanks

## 2015-11-26 NOTE — Telephone Encounter (Signed)
Referral placed.

## 2015-11-28 NOTE — Telephone Encounter (Signed)
Attempted to reach the patient's daughter, left a detailed message that the referral is placed to [facility], but that our coordinator would be in contact with her for more details. thanks

## 2015-11-28 NOTE — Telephone Encounter (Signed)
Patient's daughter Benjamine Mola [phone] called again for the details of the referral that was put through.  Please call

## 2015-12-02 ENCOUNTER — Ambulatory Visit (INDEPENDENT_AMBULATORY_CARE_PROVIDER_SITE_OTHER): Payer: Commercial Managed Care - HMO | Admitting: Internal Medicine

## 2015-12-02 ENCOUNTER — Encounter: Payer: Self-pay | Admitting: Internal Medicine

## 2015-12-02 ENCOUNTER — Ambulatory Visit: Payer: Self-pay | Admitting: Podiatry

## 2015-12-02 VITALS — BP 118/60 | HR 86 | Ht 71.5 in | Wt 163.8 lb

## 2015-12-02 DIAGNOSIS — J449 Chronic obstructive pulmonary disease, unspecified: Secondary | ICD-10-CM

## 2015-12-02 DIAGNOSIS — R0602 Shortness of breath: Secondary | ICD-10-CM | POA: Diagnosis not present

## 2015-12-02 DIAGNOSIS — J929 Pleural plaque without asbestos: Secondary | ICD-10-CM

## 2015-12-02 NOTE — Assessment & Plan Note (Signed)
Worsening over the past couple of month, but still able to perform some ADLs. Has not been completely compliant or adherent to his COPD regiment as prescribed previously by Dr. Raul Del. He has admitted to using Advair on and off along with mixing and matching different types of rescue inhalers. He is even using some inhalers for postnasal drip, nasal congestion. I suspect that he may be over medicating with inhalers and is now having the paradoxical shortness of breath at times.  Plan: -Educated patient on proper usage of maintenance and rescue inhaler -Advair 1 puff twice a day rinse after each use -A beer a rescue inhaler as needed for shortness of breath/wheezing/coughing

## 2015-12-02 NOTE — Assessment & Plan Note (Addendum)
Patient known moderate COPD, stage II. Mild to moderate increasing shortness of breath over the past 6 months, no inciting factors. After obtaining a history from the patient, it seems that he is self-medicating quite a bit and experimenting with his different inhalers. He does have Advair which is only using once a day. Other inhalers he states to don't are Combivent, Atrovent, Ventolin. He did admit today that he mixes and matches his inhalers to provide the best benefit for his runny nose and shortness of breath. He is accompanied today by his granddaughter and wife. I have explained the indication, side effects, and proper usage of inhalers to all family members. His maintenance inhaler should be Advair 250/50, 1 puff twice a day gargle and rinse after each use His rescue inhaler should be albuterol/Ventolin 2 puffs every 4-6 hours as needed for severe/moderate wheezing, coughing, shortness of breath.  Overall his COPD is stable. At this time there is limited effective adding, removing inhalers, Diskus, COPD meds in this advanced age patient. Advair and rescue inhaler would be the best COPD combination for this patient as long as he is adherent to it.  Plan: -Advair 254/50, 1 puff twice a day, rinse after each use -Albuterol rescue inhaler -Incentive spirometry 10 times per day

## 2015-12-02 NOTE — Patient Instructions (Addendum)
Follow up with Dr. Stevenson Clinch in:2 months - in office spirometry at next vist - Advair 250/50 - 1 puff in the AM and PM, gargle and rinse after each use - albuterol inhaler - 2puff every 3-4 hours as needed for shortness of breath\wheezing\recurrent cough - 2 view cxr for sob prior to follow up visit

## 2015-12-02 NOTE — Progress Notes (Signed)
[facility] Consultation    Date: 12/02/2015  MRN# [id number] Douglas Ramsey Aug 02, 1918  Referring Physician: Dr. Lacinda Axon PMD - Dr. Donnie Mesa Douglas Ramsey is a 82 y.o. old male seen in consultation for transition of care from Dr. Raul Del for COPD  CC:  Chief Complaint  Patient presents with  . pulmonary consult    per Dr. Lacinda Axon.     HPI:  Patient is a 82 year old male with a past medical history of COPD, anemia, hyperlipidemia, CK D, coronary artery disease status post MI with PCI, colon cancer, chronic rhinitis, seen in consultation for transition of care for COPD. Per review of records from Dr. Raul Del office he has stage II COPD. He was recently in the hospital in March 2017 for a mild COPD exacerbation for 5 days, at that time he was treated with steroids and nebulizers. Since his discharge she's been doing fairly well, he did have a chest x-ray follow-up from his original CT in March 2017 however that report is not available to me. Today's accompanied by his wife and his granddaughter. Frontal into the patient is seems that he does not fully comply with his inhalers. He states he has about 5 inhalers at home including Advair, Combivent, Ventolin, otherwise that he could not recall. He makes an matches his inhalers depending on his symptoms of either runny nose or shortness of breath, he may take an inhaler for runny nose followed by a nasal steroid. States that he is a never smoker. Overall he does endorse shortness of breath worsening over the past couple of months. He has followed up with cardiology, Dr. Nehemiah Massed, in April 2017 who stated that he does have arthrosclerotic disease, and we'll see him back in one year. CT chest done in March showed he had some calcified plaques, this is not new. Overall in stable pulmonary status over the past couple months with fairly slow decline.    PMHX:   Past Medical History  Diagnosis Date  . CAD (coronary artery disease)     s/p MI and PCI  . Colon cancer ([place])   . Rhinitis   . COPD (chronic obstructive pulmonary disease) ([place])   . Anemia   . Hyperlipidemia   . CKD (chronic kidney disease)    Surgical Hx:  Past Surgical History  Procedure Laterality Date  . Colon surgery      ostomy (reversed in december)  . Coronary angioplasty  02/2007  . Colonoscopy  05/13/2004  . Flexible sigmoidoscopy  05/14/2014  . Esophagogastroduodenoscopy  05/11/2014   Family Hx:  Family History  Problem Relation Age of Onset  . Heart disease Father   . Heart disease Brother    Social Hx:   Social History  Substance Use Topics  . Smoking status: Never Smoker   . Smokeless tobacco: Never Used  . Alcohol Use: No   Medication:   Current Outpatient Rx  Name  Route  Sig  Dispense  Refill  . albuterol-ipratropium (COMBIVENT) 18-103 MCG/ACT inhaler   Inhalation   Inhale 2 puffs into the lungs 2 (two) times daily.         . Ascorbic Acid (VITAMIN C) 100 MG tablet   Oral   Take 100 mg by mouth daily.         Marland Kitchen aspirin 81 MG tablet   Oral   Take 81 mg by mouth daily.         . Calcium Carb-Cholecalciferol (CALCIUM 600 + D PO)  Oral   Take 1 tablet by mouth daily.         . diphenoxylate-atropine (LOMOTIL) 2.5-0.025 MG per tablet   Oral   Take 1 tablet by mouth 4 (four) times daily as needed for diarrhea or loose stools.         Marland Kitchen doxycycline (VIBRA-TABS) 100 MG tablet   Oral   Take 1 tablet (100 mg total) by mouth 2 (two) times daily.   20 tablet   0   . Ferrous Sulfate 143 (45 FE) MG TBCR   Oral   Take by mouth daily.         . Fluticasone-Salmeterol (ADVAIR) 250-50 MCG/DOSE AEPB   Inhalation   Inhale 1 puff into the lungs 2 (two) times daily.         Marland Kitchen glucosamine-chondroitin 500-400 MG tablet   Oral   Take 1 tablet by mouth daily.         Marland Kitchen ipratropium (ATROVENT) 0.03 % nasal spray   Each Nare   Place 2 sprays into both nostrils 3 (three) times daily.   30 mL   12   .  isosorbide mononitrate (IMDUR) 30 MG 24 hr tablet   Oral   Take 1 tablet by mouth daily.         . Multiple Vitamin (MULTIVITAMIN) tablet   Oral   Take 1 tablet by mouth daily.         . nitroGLYCERIN (NITROSTAT) 0.4 MG SL tablet   Sublingual   Place 1 tablet (0.4 mg total) under the tongue every 5 (five) minutes as needed for chest pain.   50 tablet   3   . Omega-3 Fatty Acids (FISH OIL DOUBLE STRENGTH) 1200 MG CAPS   Oral   Take by mouth at bedtime.         . predniSONE (DELTASONE) 50 MG tablet      1 tablet daily for 5 days.   5 tablet   0   . simvastatin (ZOCOR) 20 MG tablet   Oral   Take 20 mg by mouth daily.         . VENTOLIN HFA 108 (90 Base) MCG/ACT inhaler   Inhalation   Inhale 1-2 puffs into the lungs every 4 (four) hours as needed.           Dispense as written.   . vitamin B-12 (CYANOCOBALAMIN) 100 MCG tablet   Oral   Take 100 mcg by mouth daily.         . Vitamins A & D (VITAMIN A & D) 10000-400 units TABS                   Allergies:  Review of patient's allergies indicates no known allergies.  Review of Systems  Constitutional: Negative for fever and weight loss.  HENT: Positive for congestion.   Eyes: Negative for blurred vision.  Respiratory: Positive for shortness of breath. Negative for cough, hemoptysis and sputum production.   Cardiovascular: Negative for chest pain.  Gastrointestinal: Negative for heartburn and nausea.  Genitourinary: Negative for dysuria.  Musculoskeletal: Negative for myalgias.  Skin: Negative for rash.  Neurological: Negative for headaches.  Endo/Heme/Allergies: Does not bruise/bleed easily.  Psychiatric/Behavioral: Negative for depression.     Physical Examination:   VS: There were no vitals taken for this visit.  General Appearance: No distress  Neuro:without focal findings, mental status, speech normal, alert and oriented, cranial nerves 2-12 intact, reflexes normal and symmetric, sensation  grossly  normal  HEENT: PERRLA, EOM intact, no ptosis, no other lesions noticed; Mallampati 2 Pulmonary: shallow basilar breath sounds., diaphragmatic excursion normal.No wheezing, No rales;   Sputum Production:  none CardiovascularNormal S1,S2.  No m/r/g.  Abdominal aorta pulsation normal.    Abdomen: Benign, Soft, non-tender, No masses, hepatosplenomegaly, No lymphadenopathy Renal:  No costovertebral tenderness  GU:  No performed at this time. Endoc: No evident thyromegaly, no signs of acromegaly or Cushing features Skin:   warm, no rashes, no ecchymosis  Extremities: normal, no cyanosis, clubbing, no edema, warm with normal capillary refill. Other findings:   Labs results:   Rad results: (The following images and results were reviewed by Dr. Stevenson Clinch on 12/02/2015). CT Chest without contrast 07/27/2015 CT CHEST WITHOUT CONTRAST  TECHNIQUE: Multidetector CT imaging of the chest was performed following the standard protocol without IV contrast.  COMPARISON: Chest CT 04/19/2014.  FINDINGS: Mediastinum/Lymph Nodes: Heart size is normal. There is no significant pericardial fluid, thickening or pericardial calcification. There is atherosclerosis of the thoracic aorta, the great vessels of the mediastinum and the coronary arteries, including calcified atherosclerotic plaque in the left main, left anterior descending, left flexed and right coronary arteries. Calcifications of the aortic valve. No pathologically enlarged mediastinal or hilar lymph nodes. Please note that accurate exclusion of hilar adenopathy is limited on noncontrast CT scans. Esophagus is unremarkable in appearance. No axillary lymphadenopathy.  Lungs/Pleura: Calcified pleural plaques are noted throughout the thorax bilaterally, presumably from asbestos related pleural disease. No definite fibrotic changes in the lungs to suggest asbestosis at this time. There is airspace consolidation and ground-glass attenuation  in the posterior aspect of the right upper lobe, compatible with pneumonia. Lungs otherwise appear clear. Trace left pleural effusion lying dependently is simple in appearance. No suspicious appearing pulmonary nodules or masses are noted.  Upper Abdomen: Incompletely visualized exophytic lesion extending off the upper pole the left kidney measures 4.2 cm in diameter, and is similar to prior examinations, previously characterized as a simple cyst. Several low-attenuation lesions are also noted in the liver, also incompletely characterize, but unchanged compared to prior examinations, presumably cysts. Atherosclerosis.  Musculoskeletal/Soft Tissues: There are no aggressive appearing lytic or blastic lesions noted in the visualized portions of the skeleton.  IMPRESSION: 1. Consolidation in the posterior aspect of the right upper lobe is compatible with pneumonia. Followup PA and lateral chest X-ray is recommended in 3-4 weeks following trial of antibiotic therapy to ensure resolution. 2. Stigmata of asbestos related pleural disease, as above. No findings to suggest asbestosis in the lungs. 3. Trace left pleural effusion lying dependently. 4. Atherosclerosis, including left main and 3 vessel coronary artery disease. 5. There are calcifications of the aortic valve. Echocardiographic correlation for evaluation of potential valvular dysfunction may be warranted if clinically indicated. 6. Additional incidental findings, as above.  Assessment and Plan: 82 year old male with past medical history of chronic shortness of breath, COPD seen for transition of care Moderate COPD (chronic obstructive pulmonary disease) ([place]) Patient known moderate COPD, stage II. Mild to moderate increasing shortness of breath over the past 6 months, no inciting factors. After obtaining a history from the patient, it seems that he is self-medicating quite a bit and experimenting with his different inhalers. He  does have Advair which is only using once a day. Other inhalers he states to don't are Combivent, Atrovent, Ventolin. He did admit today that he mixes and matches his inhalers to provide the best benefit for his runny nose and shortness of breath.  He is accompanied today by his granddaughter and wife. I have explained the indication, side effects, and proper usage of inhalers to all family members. His maintenance inhaler should be Advair 250/50, 1 puff twice a day gargle and rinse after each use His rescue inhaler should be albuterol/Ventolin 2 puffs every 4-6 hours as needed for severe/moderate wheezing, coughing, shortness of breath.  Overall his COPD is stable. At this time there is limited effective adding, removing inhalers, Diskus, COPD meds in this advanced age patient. Advair and rescue inhaler would be the best COPD combination for this patient as long as he is adherent to it.  Plan: -Advair 254/50, 1 puff twice a day, rinse after each use -Albuterol rescue inhaler -Incentive spirometry 10 times per day  SOB (shortness of breath) Worsening over the past couple of month, but still able to perform some ADLs. Has not been completely compliant or adherent to his COPD regiment as prescribed previously by Dr. Raul Del. He has admitted to using Advair on and off along with mixing and matching different types of rescue inhalers. He is even using some inhalers for postnasal drip, nasal congestion. I suspect that he may be over medicating with inhalers and is now having the paradoxical shortness of breath at times.  Plan: -Educated patient on proper usage of maintenance and rescue inhaler -Advair 1 puff twice a day rinse after each use -A beer a rescue inhaler as needed for shortness of breath/wheezing/coughing  Pleural plaque Stable pleural plaques.    Updated Medication List Outpatient Encounter Prescriptions as of 12/02/2015  Medication Sig  . albuterol-ipratropium (COMBIVENT)  18-103 MCG/ACT inhaler Inhale 2 puffs into the lungs 2 (two) times daily.  . Ascorbic Acid (VITAMIN C) 100 MG tablet Take 100 mg by mouth daily.  Marland Kitchen aspirin 81 MG tablet Take 81 mg by mouth daily.  . Calcium Carb-Cholecalciferol (CALCIUM 600 + D PO) Take 1 tablet by mouth daily.  . diphenoxylate-atropine (LOMOTIL) 2.5-0.025 MG per tablet Take 1 tablet by mouth 4 (four) times daily as needed for diarrhea or loose stools.  Marland Kitchen doxycycline (VIBRA-TABS) 100 MG tablet Take 1 tablet (100 mg total) by mouth 2 (two) times daily.  . Ferrous Sulfate 143 (45 FE) MG TBCR Take by mouth daily.  . Fluticasone-Salmeterol (ADVAIR) 250-50 MCG/DOSE AEPB Inhale 1 puff into the lungs 2 (two) times daily.  Marland Kitchen glucosamine-chondroitin 500-400 MG tablet Take 1 tablet by mouth daily.  Marland Kitchen ipratropium (ATROVENT) 0.03 % nasal spray Place 2 sprays into both nostrils 3 (three) times daily.  . isosorbide mononitrate (IMDUR) 30 MG 24 hr tablet Take 1 tablet by mouth daily.  . Multiple Vitamin (MULTIVITAMIN) tablet Take 1 tablet by mouth daily.  . nitroGLYCERIN (NITROSTAT) 0.4 MG SL tablet Place 1 tablet (0.4 mg total) under the tongue every 5 (five) minutes as needed for chest pain.  . Omega-3 Fatty Acids (FISH OIL DOUBLE STRENGTH) 1200 MG CAPS Take by mouth at bedtime.  . predniSONE (DELTASONE) 50 MG tablet 1 tablet daily for 5 days.  . simvastatin (ZOCOR) 20 MG tablet Take 20 mg by mouth daily.  . VENTOLIN HFA 108 (90 Base) MCG/ACT inhaler Inhale 1-2 puffs into the lungs every 4 (four) hours as needed.  . vitamin B-12 (CYANOCOBALAMIN) 100 MCG tablet Take 100 mcg by mouth daily.  . Vitamins A & D (VITAMIN A & D) 10000-400 units TABS    No facility-administered encounter medications on file as of 12/02/2015.    Orders for this visit: Orders Placed  This Encounter  Procedures  . DG Chest 2 View    Standing Status: Future     Number of Occurrences:      Standing Expiration Date: 01/31/2017    Order Specific Question:  Reason for  Exam (SYMPTOM  OR DIAGNOSIS REQUIRED)    Answer:  COPD    Order Specific Question:  Preferred imaging location?    Answer:  [facility]     Thank  you for the consultation and for allowing [facility] to assist in the care of your patient. Our recommendations are noted above.  Please contact us if we can be of further service.   Vilinda Boehringer, MD [facility] Office Number: [phone]  Note: This note was prepared with Dragon dictation along with smaller phrase technology. Any transcriptional errors that result from this process are unintentional.

## 2015-12-02 NOTE — Assessment & Plan Note (Signed)
Stable pleural plaques.

## 2016-01-30 ENCOUNTER — Encounter: Payer: Self-pay | Admitting: Internal Medicine

## 2016-01-30 ENCOUNTER — Ambulatory Visit
Admission: RE | Admit: 2016-01-30 | Discharge: 2016-01-30 | Disposition: A | Discharge status: Home / Self Care | Payer: Commercial Managed Care - HMO | Source: Ambulatory Visit | Attending: Internal Medicine | Admitting: Internal Medicine

## 2016-01-30 ENCOUNTER — Ambulatory Visit (INDEPENDENT_AMBULATORY_CARE_PROVIDER_SITE_OTHER): Payer: Commercial Managed Care - HMO | Admitting: Internal Medicine

## 2016-01-30 VITALS — BP 120/60 | HR 76 | Ht 71.5 in | Wt 159.0 lb

## 2016-01-30 DIAGNOSIS — J449 Chronic obstructive pulmonary disease, unspecified: Secondary | ICD-10-CM | POA: Insufficient documentation

## 2016-01-30 DIAGNOSIS — R0602 Shortness of breath: Secondary | ICD-10-CM

## 2016-01-30 NOTE — Progress Notes (Signed)
[facility] Consultation      MRN# [id number] Douglas Ramsey Oct 16, 1918   CC: Chief Complaint  Patient presents with  . Follow-up    3mo rov. pt had CXR today.      Brief History: 82 yo F with Stage II COPD, previus Fleming patient. Poor inhaler compliance, uses what he wants, when he wants. Now with ventolin use 2-3 times per day, previously using advair 250-50 1 puff bid   Events since last clinic visit: Patient presents today for follow-up visit of his COPD and shortness of breath. Today's accompanied by his wife and granddaughter. At his last visit he was supposed to be on Advair 250/50 one puff twice a day, but is currently stopped at end is using Ventolin 3-4 puffs daily. Again he is noncompliant with his inhalers, and he mixes and matches his inhalers for his comfort. He has a history of allergic rhinitis and has several nasal steroids that he uses on and off at times. Overall no significant shortness of breath or dyspnea with exertion or coughing.     Current Outpatient Prescriptions:  .  Ascorbic Acid (VITAMIN C) 100 MG tablet, Take 100 mg by mouth daily., Disp: , Rfl:  .  aspirin 81 MG tablet, Take 81 mg by mouth daily., Disp: , Rfl:  .  Calcium Carb-Cholecalciferol (CALCIUM 600 + D PO), Take 1 tablet by mouth daily., Disp: , Rfl:  .  diphenoxylate-atropine (LOMOTIL) 2.5-0.025 MG per tablet, Take 1 tablet by mouth 4 (four) times daily as needed for diarrhea or loose stools., Disp: , Rfl:  .  Ferrous Sulfate 143 (45 FE) MG TBCR, Take by mouth daily., Disp: , Rfl:  .  Fluticasone-Salmeterol (ADVAIR) 250-50 MCG/DOSE AEPB, Inhale 1 puff into the lungs 2 (two) times daily. Reported on 12/02/2015, Disp: , Rfl:  .  glucosamine-chondroitin 500-400 MG tablet, Take 1 tablet by mouth daily., Disp: , Rfl:  .  ipratropium (ATROVENT) 0.03 % nasal spray, Place 2 sprays into both nostrils 3 (three) times daily., Disp: 30 mL, Rfl: 12 .  isosorbide mononitrate  (IMDUR) 30 MG 24 hr tablet, Take 1 tablet by mouth daily., Disp: , Rfl:  .  Multiple Vitamin (MULTIVITAMIN) tablet, Take 1 tablet by mouth daily., Disp: , Rfl:  .  nitroGLYCERIN (NITROSTAT) 0.4 MG SL tablet, Place 1 tablet (0.4 mg total) under the tongue every 5 (five) minutes as needed for chest pain., Disp: 50 tablet, Rfl: 3 .  Omega-3 Fatty Acids (FISH OIL DOUBLE STRENGTH) 1200 MG CAPS, Take by mouth at bedtime., Disp: , Rfl:  .  simvastatin (ZOCOR) 20 MG tablet, Take 20 mg by mouth daily., Disp: , Rfl:  .  VENTOLIN HFA 108 (90 Base) MCG/ACT inhaler, Inhale 1-2 puffs into the lungs every 4 (four) hours as needed., Disp: , Rfl:  .  vitamin B-12 (CYANOCOBALAMIN) 100 MCG tablet, Take 100 mcg by mouth daily., Disp: , Rfl:  .  Vitamins A & D (VITAMIN A & D) 10000-400 units TABS, , Disp: , Rfl:    Review of Systems  Constitutional: Negative for chills and fever.  Respiratory: Positive for shortness of breath. Negative for hemoptysis, sputum production and wheezing.   Cardiovascular: Negative for chest pain.  Gastrointestinal: Negative for heartburn, nausea and vomiting.  Genitourinary: Positive for dysuria.  Musculoskeletal: Negative for myalgias.  Neurological: Negative for headaches.      Allergies:  Review of patient's allergies indicates no known allergies.  Physical Examination:  VS: BP 120/60 (BP  Location: Left Arm, Cuff Size: Normal)   Pulse 76   Ht 5' 11.5" (1.816 m)   Wt 159 lb (72.1 kg)   SpO2 97%   BMI 21.87 kg/m   General Appearance: No distress  HEENT: PERRLA, no ptosis, no other lesions noticed Pulmonary:normal breath sounds., diaphragmatic excursion normal.No wheezing, No rales   Cardiovascular:  Normal S1,S2.  No m/r/g.     Abdomen:Exam: Benign, Soft, non-tender, No masses  Skin:   warm, no rashes, no ecchymosis  Extremities: normal, no cyanosis, clubbing, warm with normal capillary refill.      Rad results: (The following images and results were reviewed by  Dr. Stevenson Clinch). CXR 01/30/16  - Review chest x-ray performed by Dr. Stevenson Clinch. -Clearing of multifocal pneumonia, there is some right lower lobe scarring noted otherwise significant clearing of multifocal pneumonia  Assessment and Plan: Moderate COPD (chronic obstructive pulmonary disease) ([place]) Patient known moderate COPD, stage II. Mild to moderate increasing shortness of breath over the past 6 months, no inciting factors. After obtaining a history from the patient, it seems that he is self-medicating quite a bit and experimenting with his different inhalers. He does have Advair which is only using once a day. Other inhalers he states he has are  Combivent, Atrovent, Ventolin. He did admit today that he mixes and matches his inhalers to provide the best benefit for his runny nose and shortness of breath. He is accompanied today by his granddaughter and wife. I have explained the indication, side effects, and proper usage of inhalers to all family members. His maintenance inhaler should be Advair 250/50, 1 puff twice a day gargle and rinse after each use His rescue inhaler should be albuterol/Ventolin 2 puffs every 4-6 hours as needed for severe/moderate wheezing, coughing, shortness of breath.  Overall his COPD is stable. At this time there is limited effect adding, removing inhalers, Diskus, COPD meds in this advanced age patient. Advair and rescue inhaler would be the best COPD combination for this patient as long as he is adherent to it.  Plan: - ventolin 2-3 puffs daily, hopefully he can wean off this and restart advair -Advair 250/50, 1 puff twice a day, rinse after each use - should be using -Albuterol rescue inhaler -Incentive spirometry 5-10 times per day   Updated Medication List Outpatient Encounter Prescriptions as of 01/30/2016  Medication Sig  . Ascorbic Acid (VITAMIN C) 100 MG tablet Take 100 mg by mouth daily.  Marland Kitchen aspirin 81 MG tablet Take 81 mg by mouth daily.  . Calcium  Carb-Cholecalciferol (CALCIUM 600 + D PO) Take 1 tablet by mouth daily.  . diphenoxylate-atropine (LOMOTIL) 2.5-0.025 MG per tablet Take 1 tablet by mouth 4 (four) times daily as needed for diarrhea or loose stools.  . Ferrous Sulfate 143 (45 FE) MG TBCR Take by mouth daily.  . Fluticasone-Salmeterol (ADVAIR) 250-50 MCG/DOSE AEPB Inhale 1 puff into the lungs 2 (two) times daily. Reported on 12/02/2015  . glucosamine-chondroitin 500-400 MG tablet Take 1 tablet by mouth daily.  Marland Kitchen ipratropium (ATROVENT) 0.03 % nasal spray Place 2 sprays into both nostrils 3 (three) times daily.  . isosorbide mononitrate (IMDUR) 30 MG 24 hr tablet Take 1 tablet by mouth daily.  . Multiple Vitamin (MULTIVITAMIN) tablet Take 1 tablet by mouth daily.  . nitroGLYCERIN (NITROSTAT) 0.4 MG SL tablet Place 1 tablet (0.4 mg total) under the tongue every 5 (five) minutes as needed for chest pain.  . Omega-3 Fatty Acids (FISH OIL DOUBLE STRENGTH) 1200 MG  CAPS Take by mouth at bedtime.  . simvastatin (ZOCOR) 20 MG tablet Take 20 mg by mouth daily.  . VENTOLIN HFA 108 (90 Base) MCG/ACT inhaler Inhale 1-2 puffs into the lungs every 4 (four) hours as needed.  . vitamin B-12 (CYANOCOBALAMIN) 100 MCG tablet Take 100 mcg by mouth daily.  . Vitamins A & D (VITAMIN A & D) 10000-400 units TABS    No facility-administered encounter medications on file as of 01/30/2016.     Orders for this visit: Orders Placed This Encounter  Procedures  . Spirometry with Graph    Order Specific Question:   Where should this test be performed?    Answer:   [facility]    Thank  you for the visitation and for allowing  [facility] to assist in the care of your patient. Our recommendations are noted above.  Please contact us if we can be of further service.  Vilinda Boehringer, MD [facility] Office Number: [phone]  Note: This note was prepared with Dragon dictation along with smaller phrase  technology. Any transcriptional errors that result from this process are unintentional.

## 2016-01-30 NOTE — Patient Instructions (Addendum)
Follow up with Dr. Stevenson Clinch in:6 months - cont with ventolin 2-3 puffs daily - would like you to restart advair 250/50 - 1 puff bid, at some point, when you are comfortable with it.  - cont with incentive spirometry (5-10 times daily)

## 2016-01-30 NOTE — Assessment & Plan Note (Signed)
Patient known moderate COPD, stage II. Mild to moderate increasing shortness of breath over the past 6 months, no inciting factors. After obtaining a history from the patient, it seems that he is self-medicating quite a bit and experimenting with his different inhalers. He does have Advair which is only using once a day. Other inhalers he states he has are  Combivent, Atrovent, Ventolin. He did admit today that he mixes and matches his inhalers to provide the best benefit for his runny nose and shortness of breath. He is accompanied today by his granddaughter and wife. I have explained the indication, side effects, and proper usage of inhalers to all family members. His maintenance inhaler should be Advair 250/50, 1 puff twice a day gargle and rinse after each use His rescue inhaler should be albuterol/Ventolin 2 puffs every 4-6 hours as needed for severe/moderate wheezing, coughing, shortness of breath.  Overall his COPD is stable. At this time there is limited effect adding, removing inhalers, Diskus, COPD meds in this advanced age patient. Advair and rescue inhaler would be the best COPD combination for this patient as long as he is adherent to it.  Plan: - ventolin 2-3 puffs daily, hopefully he can wean off this and restart advair -Advair 250/50, 1 puff twice a day, rinse after each use - should be using -Albuterol rescue inhaler -Incentive spirometry 5-10 times per day

## 2016-02-03 NOTE — Progress Notes (Deleted)
Douglas Ramsey  [phone] [phone]) [phone]  ID: Douglas Ramsey OB: 16-May-1919  MR#: [id number]  [id number]  Patient Care Team: Coral Spikes, DO as PCP - General (Family Medicine)  CHIEF COMPLAINT: Stage IIIB adenocarcinoma of the sigmoid colon.  INTERVAL HISTORY: Patient returns to clinic today for routine six-month follow-up and further evaluation. He Has multiple complaints today. He was recently admitted to the hospital in March for a bout of pneumonia. He feels persistently weak and fatigued. He has no neurologic complaints. He denies any fevers. He has a good appetite and denies weight loss. He denies any chest pain or shortness of breath. He denies any nausea, vomiting, constipation, or diarrhea. He has no melena or hematochezia. Patient offers no further specific complaints today.  REVIEW OF SYSTEMS:   Review of Systems  Constitutional: Positive for malaise/fatigue. Negative for fever and weight loss.  Respiratory: Negative.   Cardiovascular: Negative.   Gastrointestinal: Negative.  Negative for abdominal pain, blood in stool and melena.  Musculoskeletal: Negative.   Neurological: Positive for weakness.    As per HPI. Otherwise, a complete review of systems is negatve.  PAST MEDICAL HISTORY:  GERD, CAD status post stent.  PAST SURGICAL HISTORY:  Partial colon resection, partial small bowel resection, cataract surgery.  FAMILY HISTORY: Reviewed and unchanged. No reported history of malignancy or chronic disease.     ADVANCED DIRECTIVES:    HEALTH MAINTENANCE: Social History  Substance Use Topics  . Smoking status: Never Smoker  . Smokeless tobacco: Never Used  . Alcohol use No     Colonoscopy:  PAP:  Bone density:  Lipid panel:  No Known Allergies  Current Outpatient Prescriptions  Medication Sig Dispense Refill  . Ascorbic Acid (VITAMIN C) 100 MG tablet Take 100 mg by mouth daily.    Marland Kitchen aspirin 81 MG tablet Take 81 mg by  mouth daily.    . Calcium Carb-Cholecalciferol (CALCIUM 600 + D PO) Take 1 tablet by mouth daily.    . diphenoxylate-atropine (LOMOTIL) 2.5-0.025 MG per tablet Take 1 tablet by mouth 4 (four) times daily as needed for diarrhea or loose stools.    . Ferrous Sulfate 143 (45 FE) MG TBCR Take by mouth daily.    Marland Kitchen glucosamine-chondroitin 500-400 MG tablet Take 1 tablet by mouth daily.    Marland Kitchen ipratropium (ATROVENT) 0.03 % nasal spray Place 2 sprays into both nostrils 3 (three) times daily. 30 mL 12  . isosorbide mononitrate (IMDUR) 30 MG 24 hr tablet Take 1 tablet by mouth daily.    . Multiple Vitamin (MULTIVITAMIN) tablet Take 1 tablet by mouth daily.    . nitroGLYCERIN (NITROSTAT) 0.4 MG SL tablet Place 1 tablet (0.4 mg total) under the tongue every 5 (five) minutes as needed for chest pain. 50 tablet 3  . Omega-3 Fatty Acids (FISH OIL DOUBLE STRENGTH) 1200 MG CAPS Take by mouth at bedtime.    . simvastatin (ZOCOR) 20 MG tablet Take 20 mg by mouth daily.    . VENTOLIN HFA 108 (90 Base) MCG/ACT inhaler Inhale 1-2 puffs into the lungs every 4 (four) hours as needed.    . vitamin B-12 (CYANOCOBALAMIN) 100 MCG tablet Take 100 mcg by mouth daily.    . Vitamins A & D (VITAMIN A & D) 10000-400 units TABS      No current facility-administered medications for this visit.     OBJECTIVE: There were no vitals filed for this visit.   There is no height or weight  on file to calculate BMI.    ECOG FS:1 - Symptomatic but completely ambulatory  General: Well-developed, well-nourished, no acute distress. Eyes: Pink conjunctiva, anicteric sclera. Lungs: Clear to auscultation bilaterally. Heart: Regular rate and rhythm. No rubs, murmurs, or gallops. Abdomen: Soft, nontender, nondistended. No organomegaly noted, normoactive bowel sounds. Musculoskeletal: No edema, cyanosis, or clubbing. Neuro: Alert, answering all questions appropriately. Cranial nerves grossly intact. Skin: No rashes or petechiae noted. Psych:  Normal affect.  LAB RESULTS:  Lab Results  Component Value Date   NA 140 07/28/2015   K 4.8 07/28/2015   CL 109 07/28/2015   CO2 25 07/28/2015   GLUCOSE 129 (H) 07/28/2015   BUN 41 (H) 07/28/2015   CREATININE 1.29 (H) 07/28/2015   CALCIUM 8.5 (L) 07/28/2015   PROT 5.5 (L) 05/21/2014   ALBUMIN 2.1 (L) 05/21/2014   AST 34 05/21/2014   ALT 22 05/21/2014   ALKPHOS 66 05/21/2014   BILITOT 0.6 05/21/2014   GFRNONAA 46 (L) 07/28/2015   GFRAA 53 (L) 07/28/2015    Lab Results  Component Value Date   WBC 19.3 (H) 08/05/2015   NEUTROABS 15.1 (H) 08/05/2015   HGB 14.4 08/05/2015   HCT 44.0 08/05/2015   MCV 87.2 08/05/2015   PLT 268 08/05/2015   Lab Results  Component Value Date   CEA 3.2 08/05/2015    STUDIES: Dg Chest 2 View  Result Date: 01/30/2016 CLINICAL DATA:  Shortness of breath, worsening the past few months. COPD. EXAM: CHEST  2 VIEW COMPARISON:  None. FINDINGS: Heart is normal size. There is hyperinflation of the lungs compatible with COPD. Areas of scarring in the mid lungs bilaterally. Previously seen multifocal consolidation has resolved. No effusions. No acute bony abnormality. IMPRESSION: Interval resolution of the previously seen multifocal pneumonia. Areas of scarring in the mid lungs. COPD. Electronically Signed   By: Rolm Baptise M.D.   On: 01/30/2016 13:16    ASSESSMENT: Stage IIIB adenocarcinoma of the sigmoid colon.  PLAN:    1. Stage IIIB adenocarcinoma of the sigmoid colon: CT results reported in December 2015 revealed no obvious metastatic disease. CEA is 3.2 today. Despite patient's stage of disease, no chemotherapy was given secondary to his advanced age. No intervention is needed at this time. Patient initially refused any further follow-up, but then subsequently change his mind and agree to return to clinic in 6 months for repeat laboratory work and further evaluation.   2. Iron deficiency anemia: Patient's hemoglobin and iron stores are now within  normal limits. Return to clinic in 6 months as above.  3. Leukocytosis: Likely reactive, monitor.   Patient expressed understanding and was in agreement with this plan. He also understands that He can call clinic at any time with any questions, concerns, or complaints.   Lloyd Huger, MD   02/03/2016 1:14 PM

## 2016-02-05 ENCOUNTER — Inpatient Hospital Stay: Payer: Commercial Managed Care - HMO

## 2016-02-05 ENCOUNTER — Inpatient Hospital Stay: Payer: Commercial Managed Care - HMO | Admitting: Oncology

## 2016-02-20 DIAGNOSIS — H0015 Chalazion left lower eyelid: Secondary | ICD-10-CM | POA: Diagnosis not present

## 2016-03-01 ENCOUNTER — Telehealth: Payer: Self-pay | Admitting: Internal Medicine

## 2016-03-01 MED ORDER — FLUTICASONE-SALMETEROL 250-50 MCG/DOSE IN AEPB: 1.0000 | INHALATION_SPRAY | Freq: Two times a day (BID) | RESPIRATORY_TRACT | 11 refills | Status: DC

## 2016-03-01 NOTE — Telephone Encounter (Signed)
LMOM for daughter Douglas Ramsey to call back in regards to her message.

## 2016-03-01 NOTE — Telephone Encounter (Signed)
Pt daughter calling if we can give patient a written prescription for Advair and if we have some samples until they come in this would be helpful Please call when done.

## 2016-03-01 NOTE — Telephone Encounter (Signed)
Informed daughter we have no advair samples but I will get RX signed to have picked up. Nothing further needed. It is for pt assistance.

## 2016-03-02 NOTE — Telephone Encounter (Signed)
Informed wife of the pt assistance program with [employer]. Informed to call back if she has any questions. Nothing further needed.

## 2016-03-02 NOTE — Telephone Encounter (Signed)
Please call Wife about discount programs for advair.

## 2016-03-08 DIAGNOSIS — H01003 Unspecified blepharitis right eye, unspecified eyelid: Secondary | ICD-10-CM | POA: Diagnosis not present

## 2016-03-25 ENCOUNTER — Telehealth: Payer: Self-pay | Admitting: Pulmonary Disease

## 2016-03-25 NOTE — Telephone Encounter (Signed)
Please call daughter Eustaquio Maize to discuss rx for advair and application for [employer] medication assistance.She wants to know if she can fax copy of application to office and have [place] attach a rx then send to [employer].  She says the first copy was lost.  Please call to discuss what to do.

## 2016-03-25 NOTE — Telephone Encounter (Signed)
Spoke with daughter and she is going to email the application and have pt to bring by the additional forms and I will fax with the RX. Nothing further needed at this time.

## 2016-04-02 ENCOUNTER — Other Ambulatory Visit: Payer: Self-pay | Admitting: *Deleted

## 2016-04-02 MED ORDER — FLUTICASONE-SALMETEROL 250-50 MCG/DOSE IN AEPB: 1.0000 | INHALATION_SPRAY | Freq: Two times a day (BID) | RESPIRATORY_TRACT | 11 refills | Status: DC

## 2016-04-19 ENCOUNTER — Ambulatory Visit (INDEPENDENT_AMBULATORY_CARE_PROVIDER_SITE_OTHER): Payer: Commercial Managed Care - HMO | Admitting: Family Medicine

## 2016-04-19 DIAGNOSIS — J300 Vasomotor rhinitis: Secondary | ICD-10-CM

## 2016-04-19 DIAGNOSIS — R0602 Shortness of breath: Secondary | ICD-10-CM | POA: Diagnosis not present

## 2016-04-19 DIAGNOSIS — N3281 Overactive bladder: Secondary | ICD-10-CM

## 2016-04-19 MED ORDER — MIRABEGRON ER 25 MG PO TB24: 25.0000 mg | ORAL_TABLET | Freq: Every day | ORAL | 0 refills | Status: DC

## 2016-04-19 MED ORDER — AZELASTINE-FLUTICASONE 137-50 MCG/ACT NA SUSP: NASAL | 6 refills | Status: DC

## 2016-04-19 MED ORDER — MIRABEGRON ER 25 MG PO TB24: 25.0000 mg | ORAL_TABLET | Freq: Every day | ORAL | 1 refills | Status: DC

## 2016-04-19 NOTE — Patient Instructions (Signed)
Take the medications as prescribed.  Follow up in 3-6 months.  Take care  Dr. Lacinda Axon

## 2016-04-20 DIAGNOSIS — N3281 Overactive bladder: Secondary | ICD-10-CM | POA: Insufficient documentation

## 2016-04-20 NOTE — Assessment & Plan Note (Addendum)
Adding Dymista.

## 2016-04-20 NOTE — Progress Notes (Signed)
Subjective:  Patient ID: Douglas Ramsey, male    DOB: [birth year]  Age: 82 y.o. MRN: [id number]  CC: Follow up SOB; Complains of Urinary incontinence and runny nose  HPI:  82 year old male with moderate COPD, CAD, CKD presents for follow-up. He also has additional complaints.  Shortness of breath  Patient continues to have shortness of breath.  He states that it's more or less stable.  He is followed closely by pulmonology. Per pulmonology, he mixes and matches his inhalers and does not take them as prescribed.  He is very bothered by this and does not seem to understand his underlying condition as well as the reasoning for taking his medications as prescribed.  Overactive bladder and urinary incontinence  Patient endorses urinary urgency and episodes of incontinence.  He states "when I have to go, I have to go".   His is quite troubled by it and wants to discuss medication for this today.  He would like to discuss Myrbetriq (he brought an ad with him).  Runny nose  Patient continues to have chronic rhinorrhea/rhinitis.  This appears to be vasomotor and gustatory.  He is taking Atrovent and has had some improvement. He also feels like his inhalers are playing a role.  Exacerbated by eating as well as cold temperatures.  He is quite bothered by this and also does not understand why this does not seem to improve.  Social Hx   Social History   Social History  . Marital status: Married    Spouse name: N/A  . Number of children: N/A  . Years of education: N/A   Social History Main Topics  . Smoking status: Never Smoker  . Smokeless tobacco: Never Used  . Alcohol use No  . Drug use: No  . Sexual activity: No   Other Topics Concern  . Not on file   Social History Narrative  . No narrative on file    Review of Systems  HENT: Positive for rhinorrhea.   Respiratory: Positive for shortness of breath.   Genitourinary: Positive for urgency.       Incontinence.    Objective:  BP 107/61 (BP Location: Left Arm, Patient Position: Sitting, Cuff Size: Normal)   Pulse 84   Temp 97.6 F (36.4 C) (Oral)   Resp 12   Wt 162 lb 8 oz (73.7 kg)   SpO2 96%   BMI 22.35 kg/m   BP/Weight 04/19/2016 01/30/2016 [id number]  Systolic BP [id number] [id number] [id number]  Diastolic BP 61 60 60  Wt. (Lbs) 162.5 159 163.8  BMI 22.35 21.87 22.53   Physical Exam  Constitutional: He appears well-developed. No distress.  Cardiovascular: Normal rate and regular rhythm.   Pulmonary/Chest: Effort normal and breath sounds normal. He has no wheezes. He has no rales.  Neurological: He is alert.  Psychiatric: He has a normal mood and affect.  Vitals reviewed.  Lab Results  Component Value Date   WBC 19.3 (H) 08/05/2015   HGB 14.4 08/05/2015   HCT 44.0 08/05/2015   PLT 268 08/05/2015   GLUCOSE 129 (H) 07/28/2015   ALT 22 05/21/2014   AST 34 05/21/2014   NA 140 07/28/2015   K 4.8 07/28/2015   CL 109 07/28/2015   CREATININE 1.29 (H) 07/28/2015   BUN 41 (H) 07/28/2015   CO2 25 07/28/2015   INR 1.1 05/16/2014   Assessment & Plan:   Problem List Items Addressed This Visit    Vasomotor rhinitis    Adding Dymista.  SOB (shortness of breath)    Stable. Secondary to advanced age and COPD. Advised compliance with Advair twice daily.      OAB (overactive bladder)    New problem (to me). Trial of Myrbetriq.        Meds ordered this encounter  Medications  . Azelastine-Fluticasone 137-50 MCG/ACT SUSP    Sig: 1 spray per nostril twice daily.    Dispense:  23 g    Refill:  6  . DISCONTD: mirabegron ER (MYRBETRIQ) 25 MG TB24 tablet    Sig: Take 1 tablet (25 mg total) by mouth daily.    Dispense:  180 tablet    Refill:  1  . mirabegron ER (MYRBETRIQ) 25 MG TB24 tablet    Sig: Take 1 tablet (25 mg total) by mouth daily.    Dispense:  90 tablet    Refill:  0    Follow-up: 3-6 months.  [place]

## 2016-04-20 NOTE — Assessment & Plan Note (Signed)
New problem (to me). Trial of Myrbetriq.

## 2016-04-20 NOTE — Assessment & Plan Note (Signed)
Stable. Secondary to advanced age and COPD. Advised compliance with Advair twice daily.

## 2016-04-30 ENCOUNTER — Other Ambulatory Visit: Payer: Self-pay | Admitting: Family Medicine

## 2016-04-30 MED ORDER — IPRATROPIUM BROMIDE 0.03 % NA SOLN: 2.0000 | Freq: Three times a day (TID) | NASAL | 12 refills | Status: DC

## 2016-04-30 NOTE — Telephone Encounter (Signed)
Refilled 09/2015. Pt last seen 04/19/16. Please advise?

## 2016-05-03 DIAGNOSIS — H353221 Exudative age-related macular degeneration, left eye, with active choroidal neovascularization: Secondary | ICD-10-CM | POA: Diagnosis not present

## 2016-05-05 IMAGING — CR DG ABDOMEN 1V
1 series · 2 of 2 positions shown · non-contrast
Comparison: Were 05/14/2014

CLINICAL DATA: New onset abdominal distention. History of colon
cancer.

EXAM:
ABDOMEN - 1 VIEW

[Series 1: dxr abdomen ap only · 0.14mm/px · 2 of 2 slices shown]
[im 1/2]
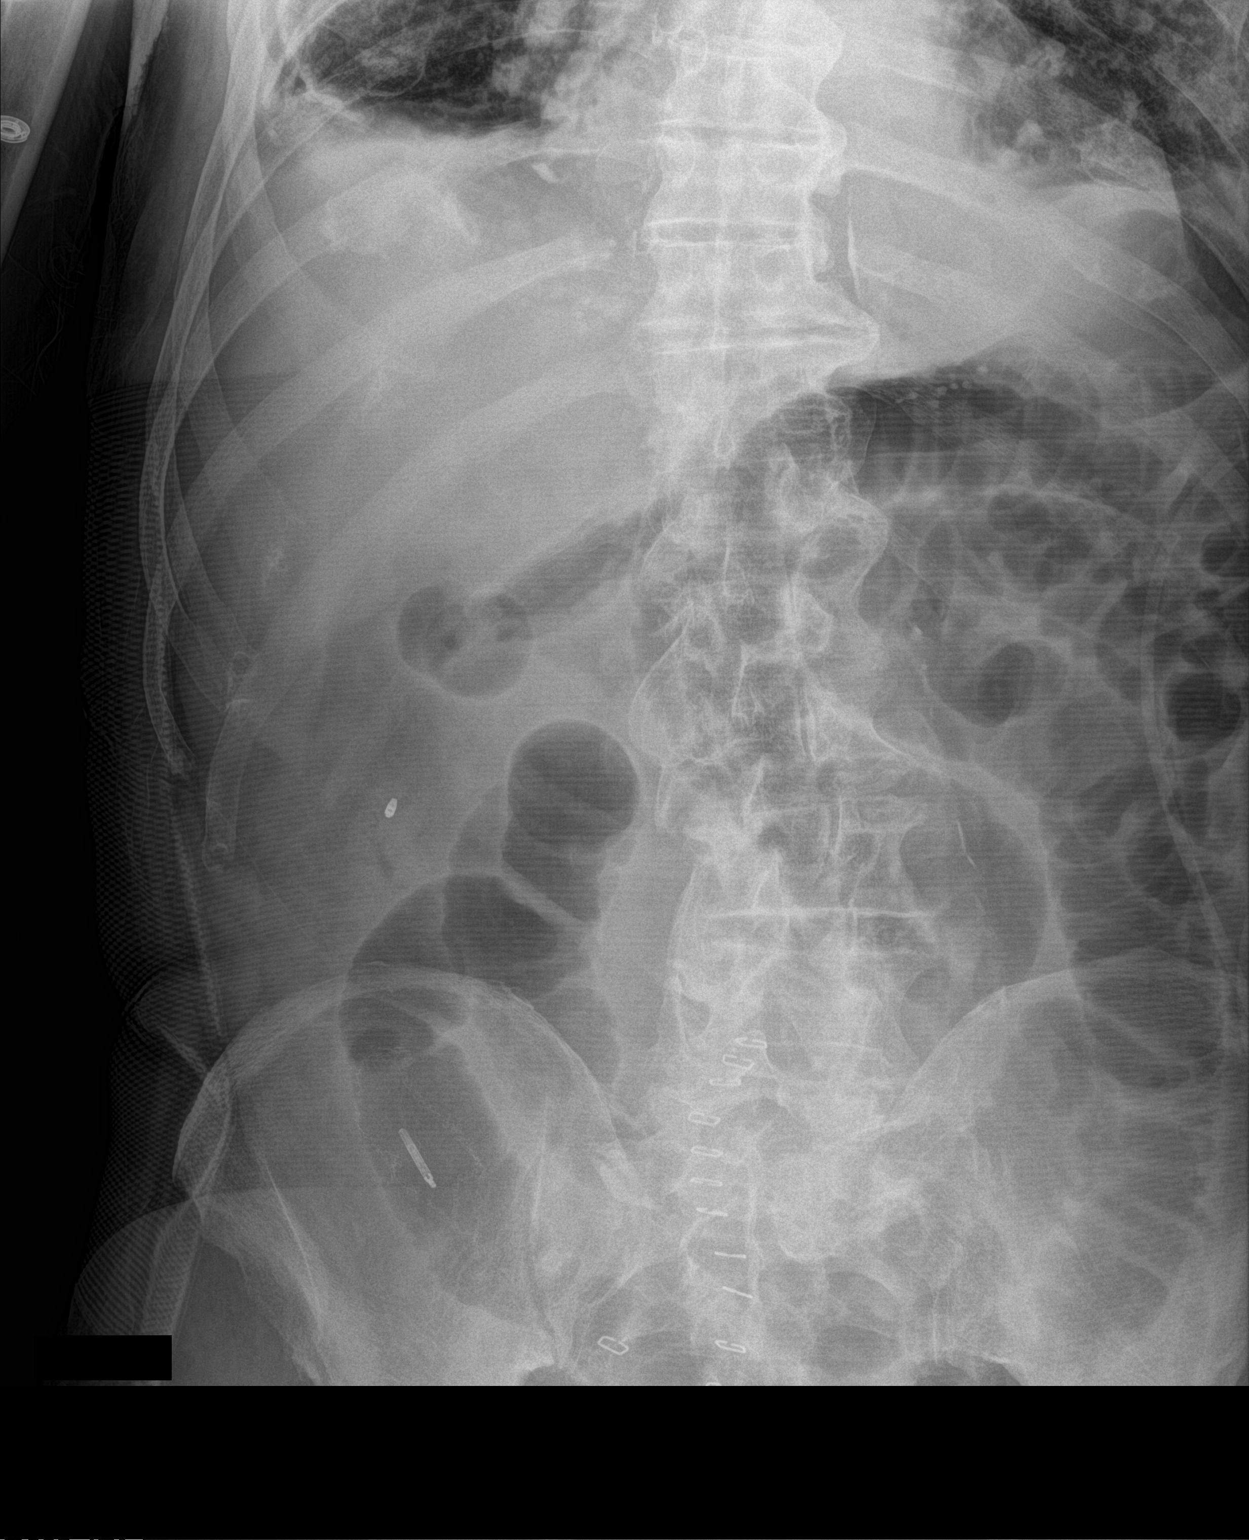
[im 2/2]
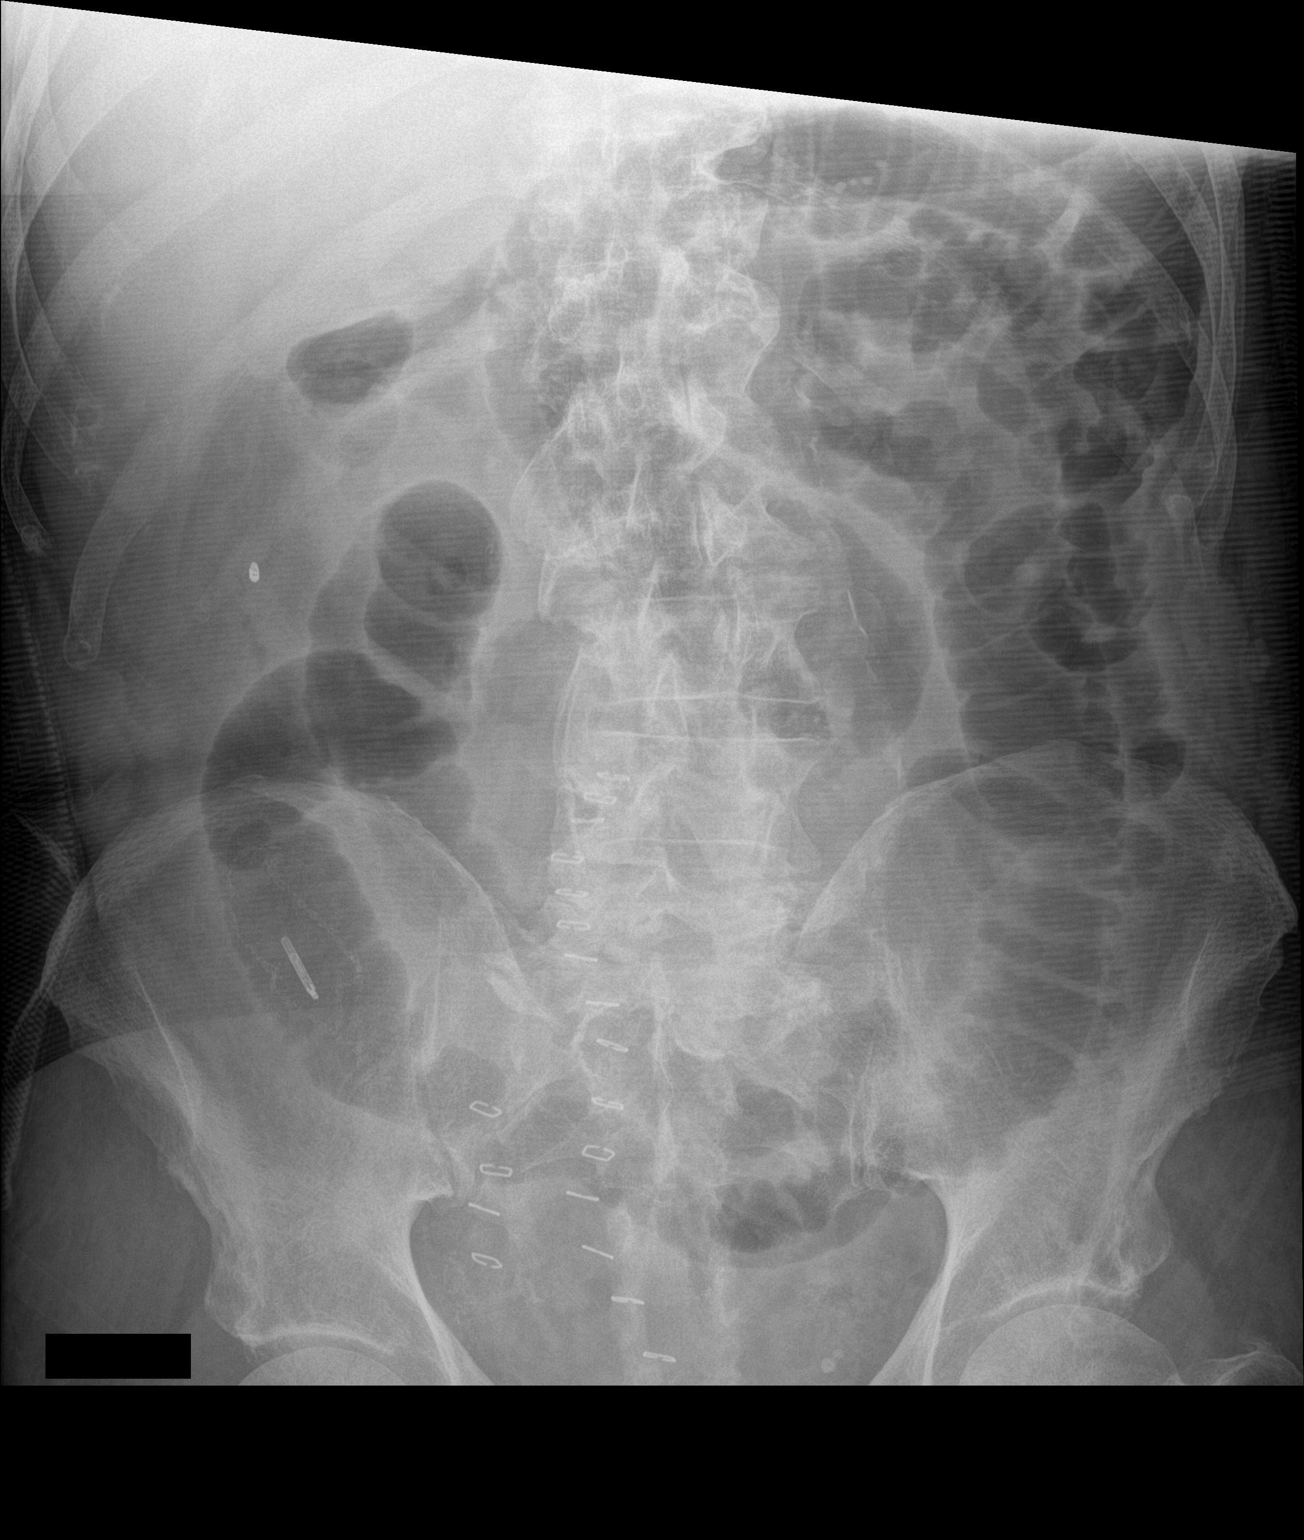

[2 of 2 positions shown; findings below may reference images not displayed]

FINDINGS: Dilated loops of small bowel are identified. There is a paucity of
colonic gas. No free intraperitoneal air identified. Ventral skin
staples noted.
IMPRESSION: 1. Dilated loops of small bowel which may reflect small-bowel
obstruction or postoperative ileus.

## 2016-05-06 IMAGING — CR DG ABDOMEN 1V
1 series · 1 of 1 positions shown · non-contrast
Comparison: Abdominal series of December 28, 2014

CLINICAL DATA: Status post colostomy reversal 4 days ago, follow-up
of ileus -small bowel obstruction.

EXAM:
ABDOMEN - 1 VIEW

[supine kub]
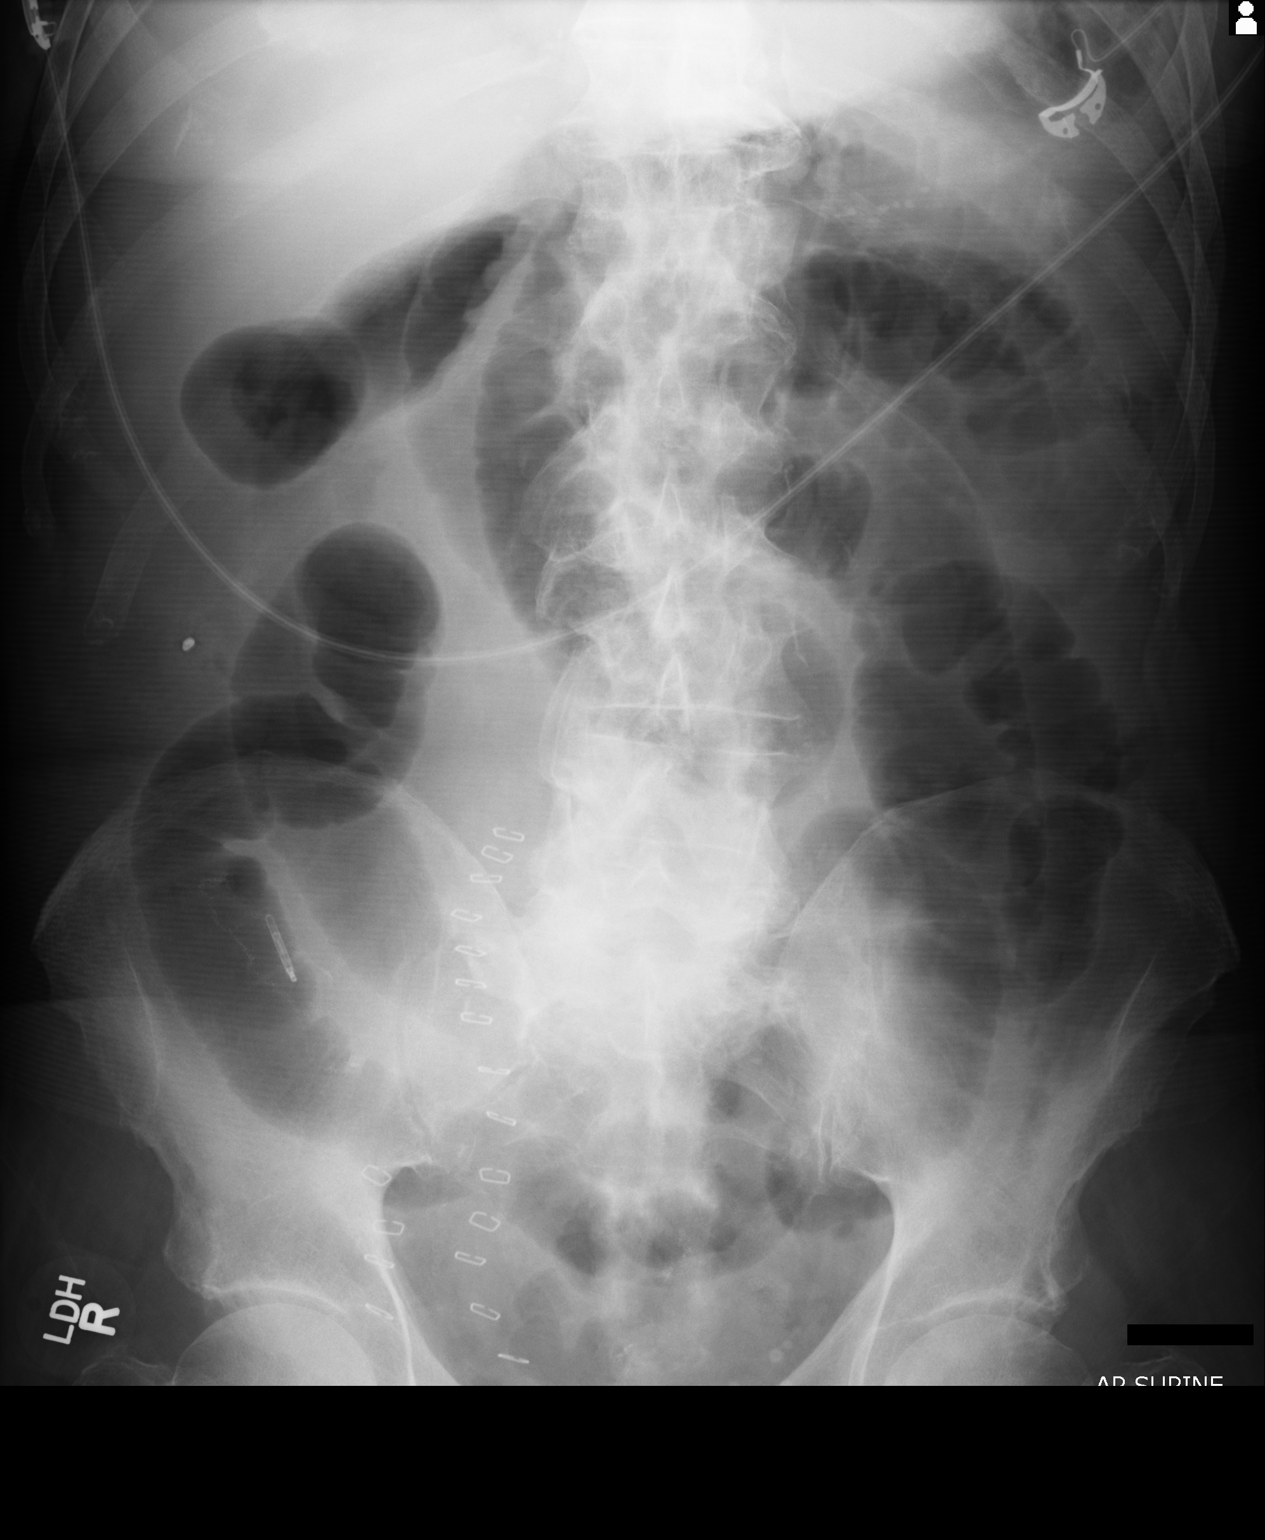

[1 of 1 positions shown; findings below may reference images not displayed]

FINDINGS: There are persistent mildly distended small bowel loops throughout
the abdomen. No significant colonic gas is demonstrated. There is a
tiny amount of gas in the rectum. No free extraluminal gas
collections are demonstrated. There is are stable metallic densities
projecting over the right flank and right iliac crest. Midline lower
abdominal and pelvic skin staples are present. There are phleboliths
within the pelvis and calcifications in the region of the pancreatic
body.
IMPRESSION: Stable bowel gas pattern consistent with a distal small bowel
obstruction or ileus.

## 2016-05-08 IMAGING — CR DG CHEST 1V PORT
1 series · 1 of 1 positions shown · non-contrast
Comparison: the previous day's study

CLINICAL DATA: Shortness of breath post colostomy reversal

EXAM:
PORTABLE CHEST - 1 VIEW

[ap]
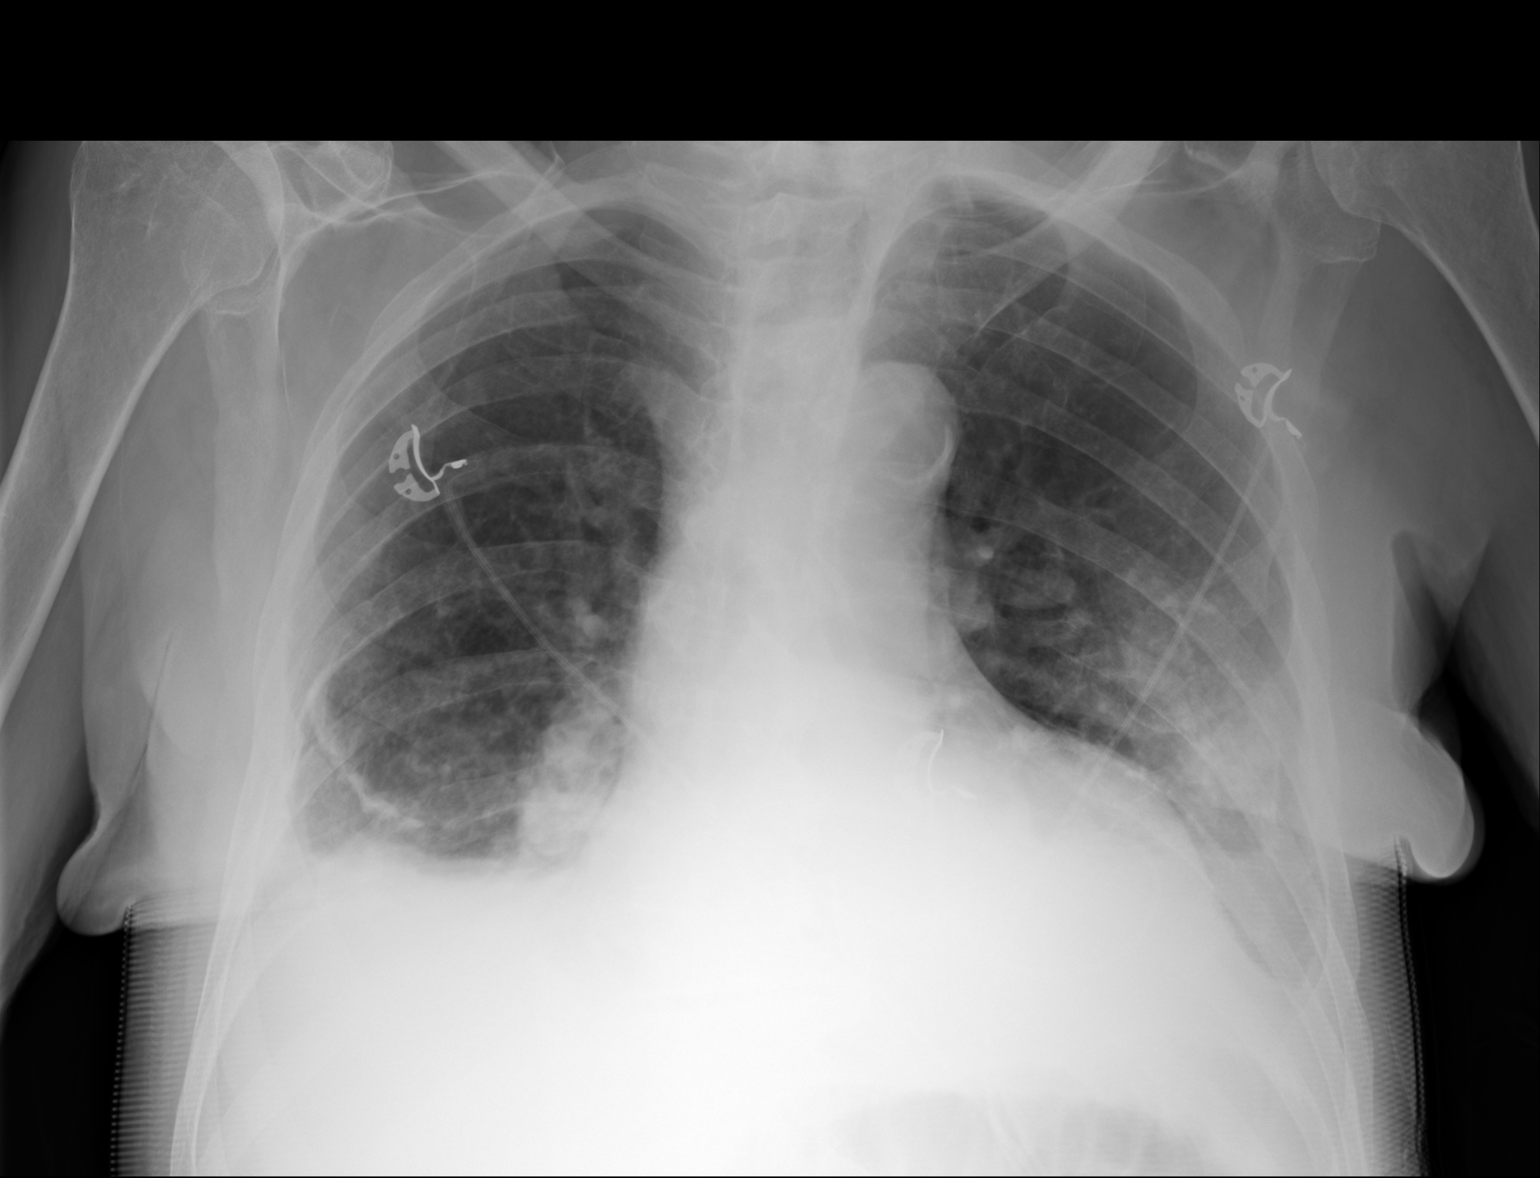

[1 of 1 positions shown; findings below may reference images not displayed]

FINDINGS: Small pleural effusions and bibasilar consolidation/atelectasis,
left greater than right, partially improved from previous exam.
Heart size upper limits normal for technique. Atheromatous aorta.
Partially calcified bilateral pleural plaques. No effusion.
No pneumothorax.  Spurring in the mid and lower thoracic spine.
IMPRESSION: 1. Bilateral small pleural effusions and bibasilar
atelectasis/consolidation, slightly improved since previous exam.

## 2016-05-08 IMAGING — CR DG ABDOMEN 2V
1 series · 3 of 3 positions shown · non-contrast
Comparison: 07/31/2014

CLINICAL DATA: Subsequent evaluation abdominal distension shortness
of breath weakness

EXAM:
ABDOMEN - 2 VIEW

[Series 1: dxr abdomen 2 v flat and erect · 0.14mm/px · 3 of 3 slices shown]
[im 1/3]
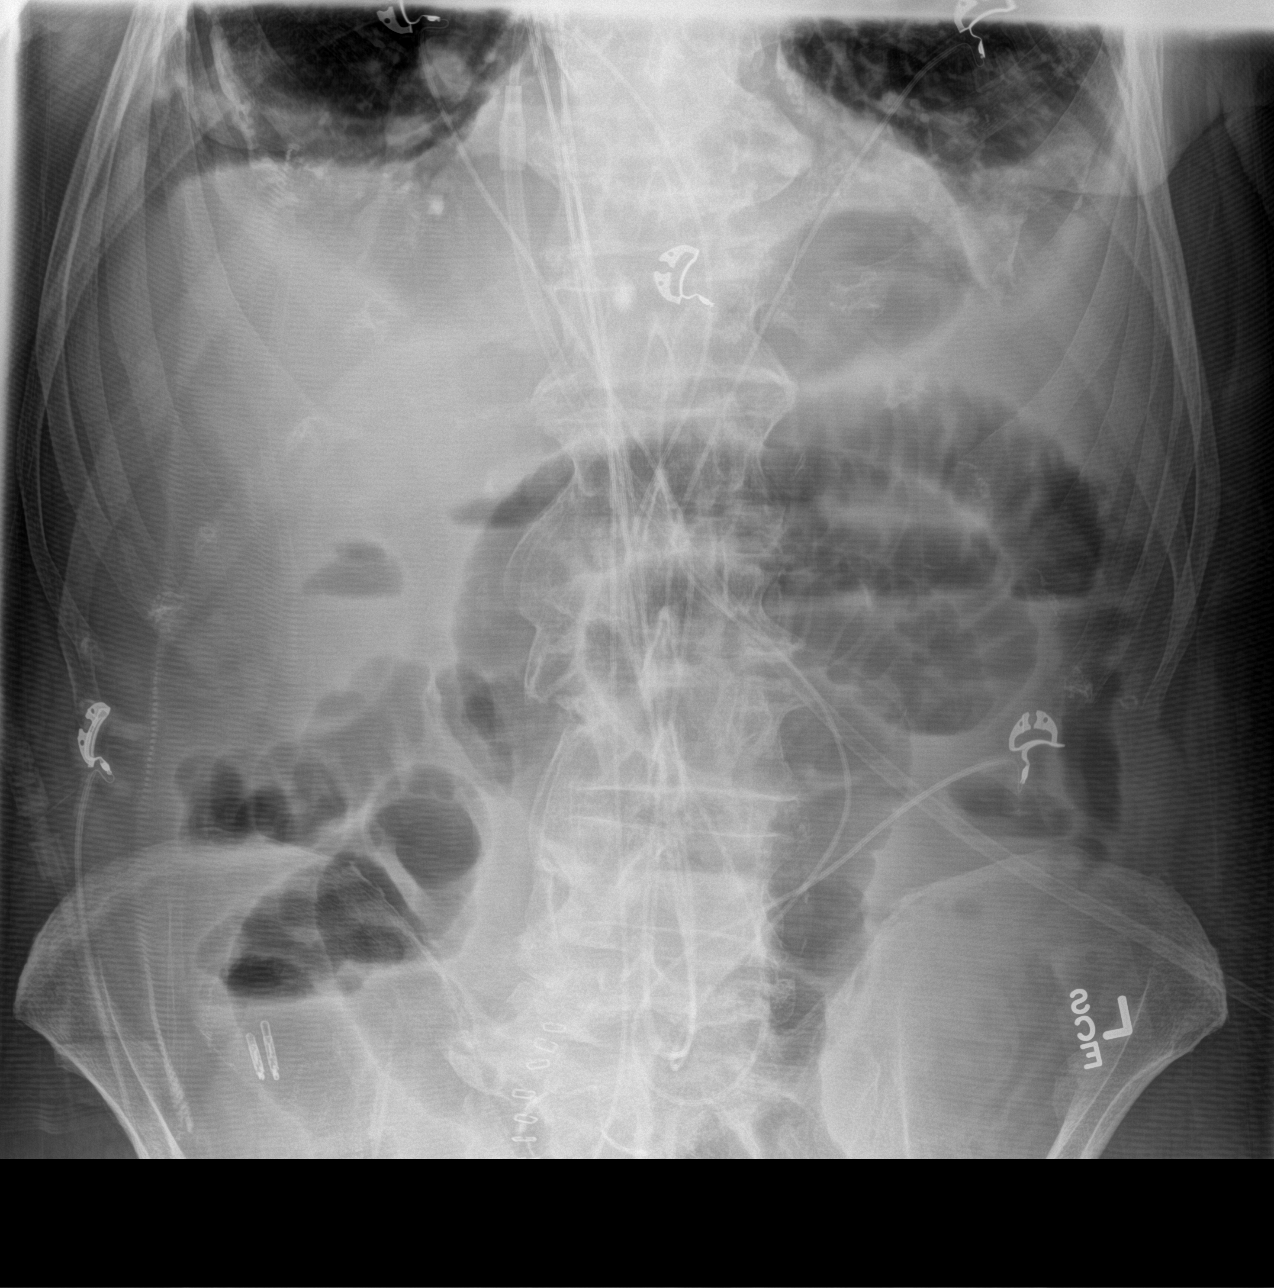
[im 2/3]
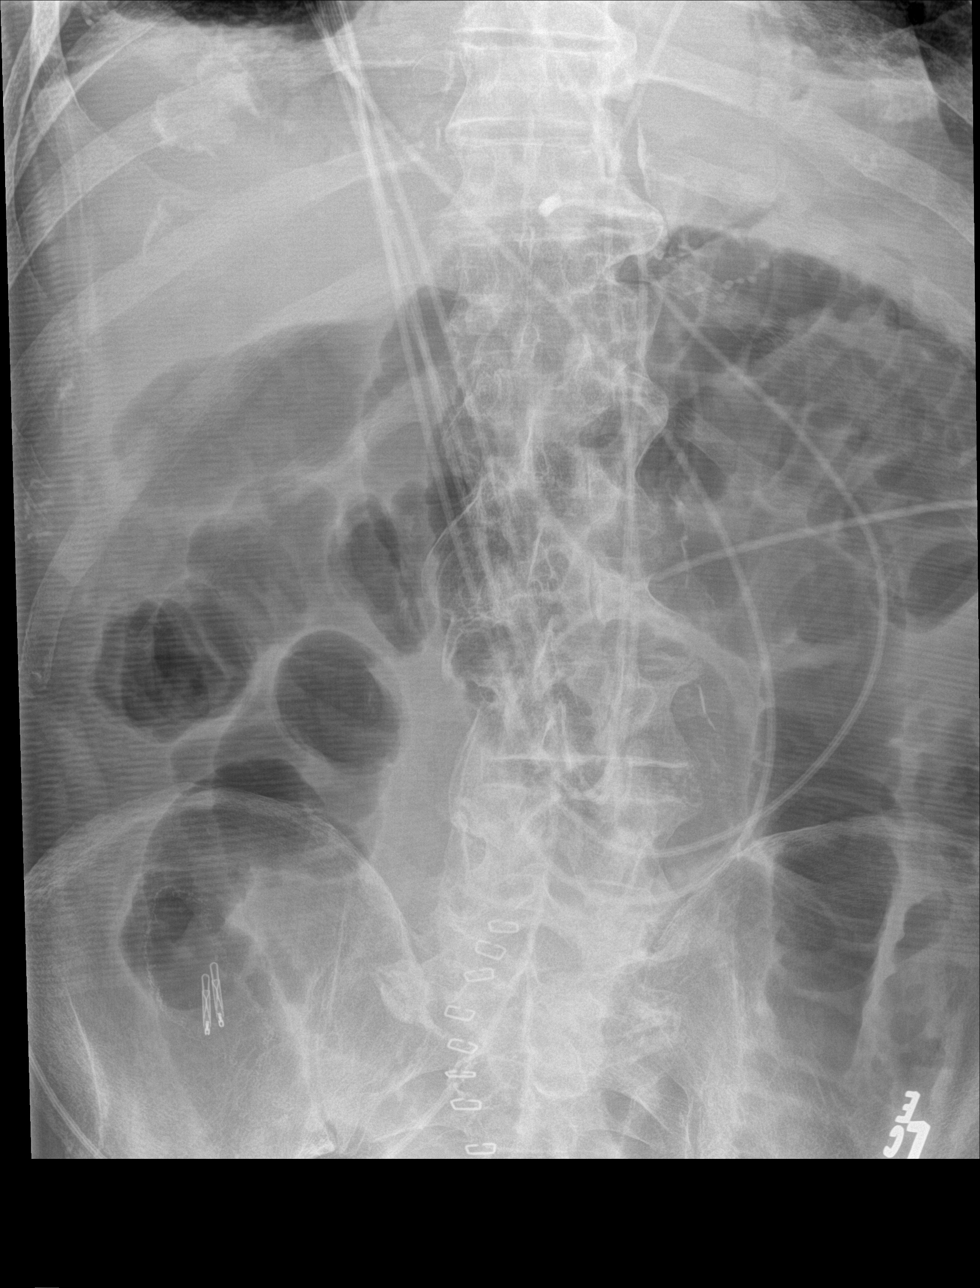
[im 3/3]
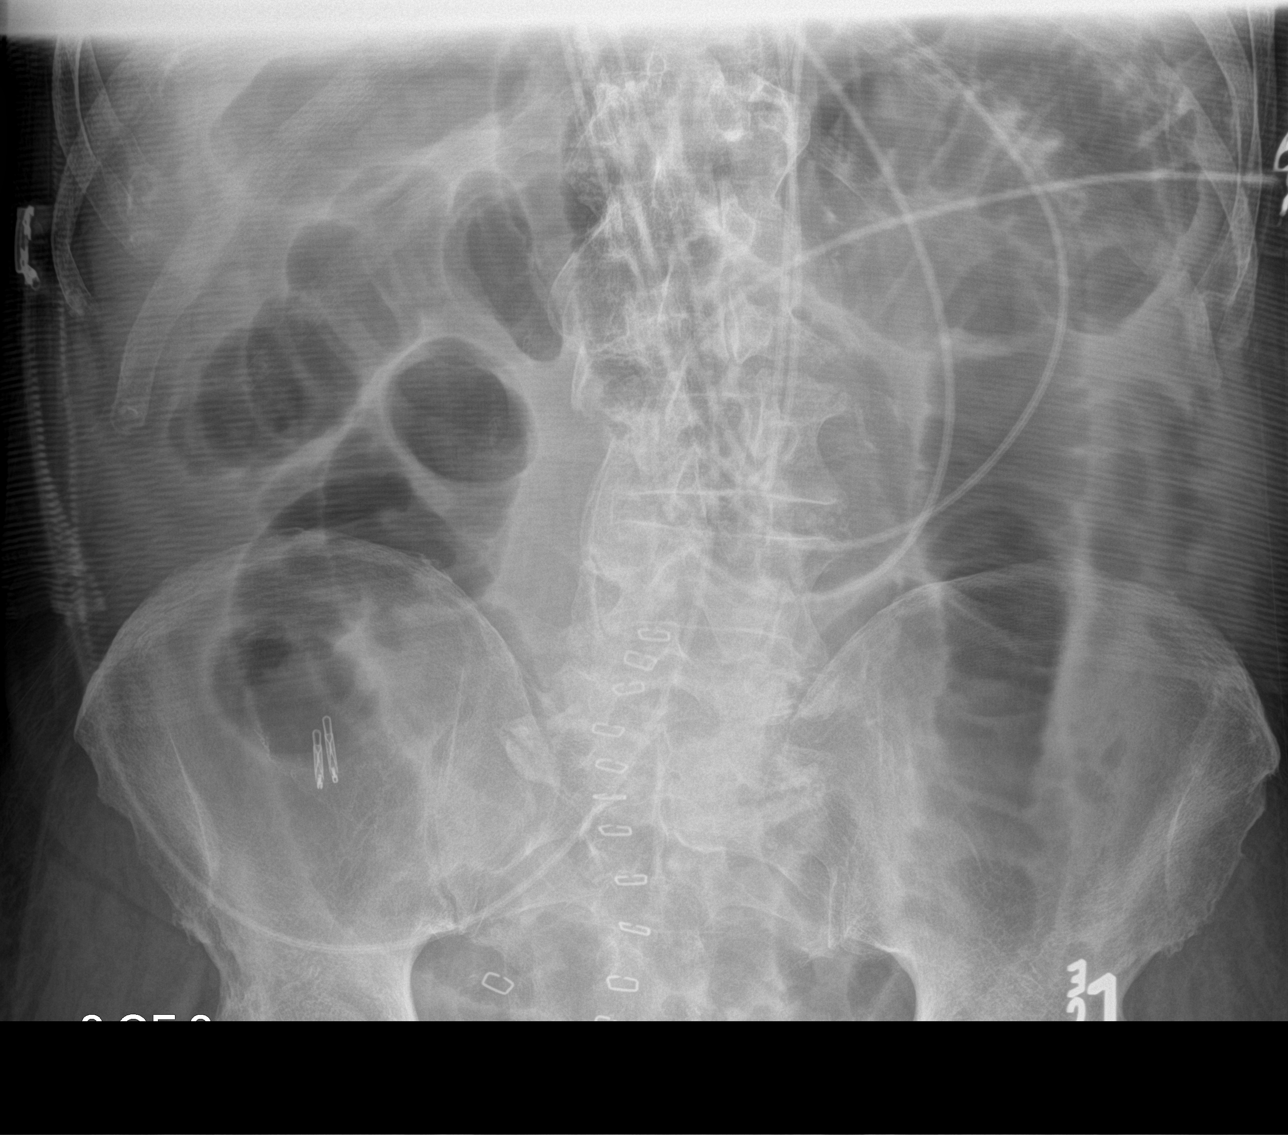

[3 of 3 positions shown; findings below may reference images not displayed]

FINDINGS: Mildly dilated loops of small bowel in the central abdomen. There
are air-fluid levels. Abdominal wall staples postsurgical are noted.
There does appear to be some gas into the large bowel.
IMPRESSION: Persistent small bowel dilatation without significant interval
change.

## 2016-05-08 IMAGING — CR DG ABDOMEN 1V
1 series · 1 of 1 positions shown · non-contrast
Comparison: the previous day's study

CLINICAL DATA: ileus

EXAM:
ABDOMEN - 1 VIEW

[supine kub]
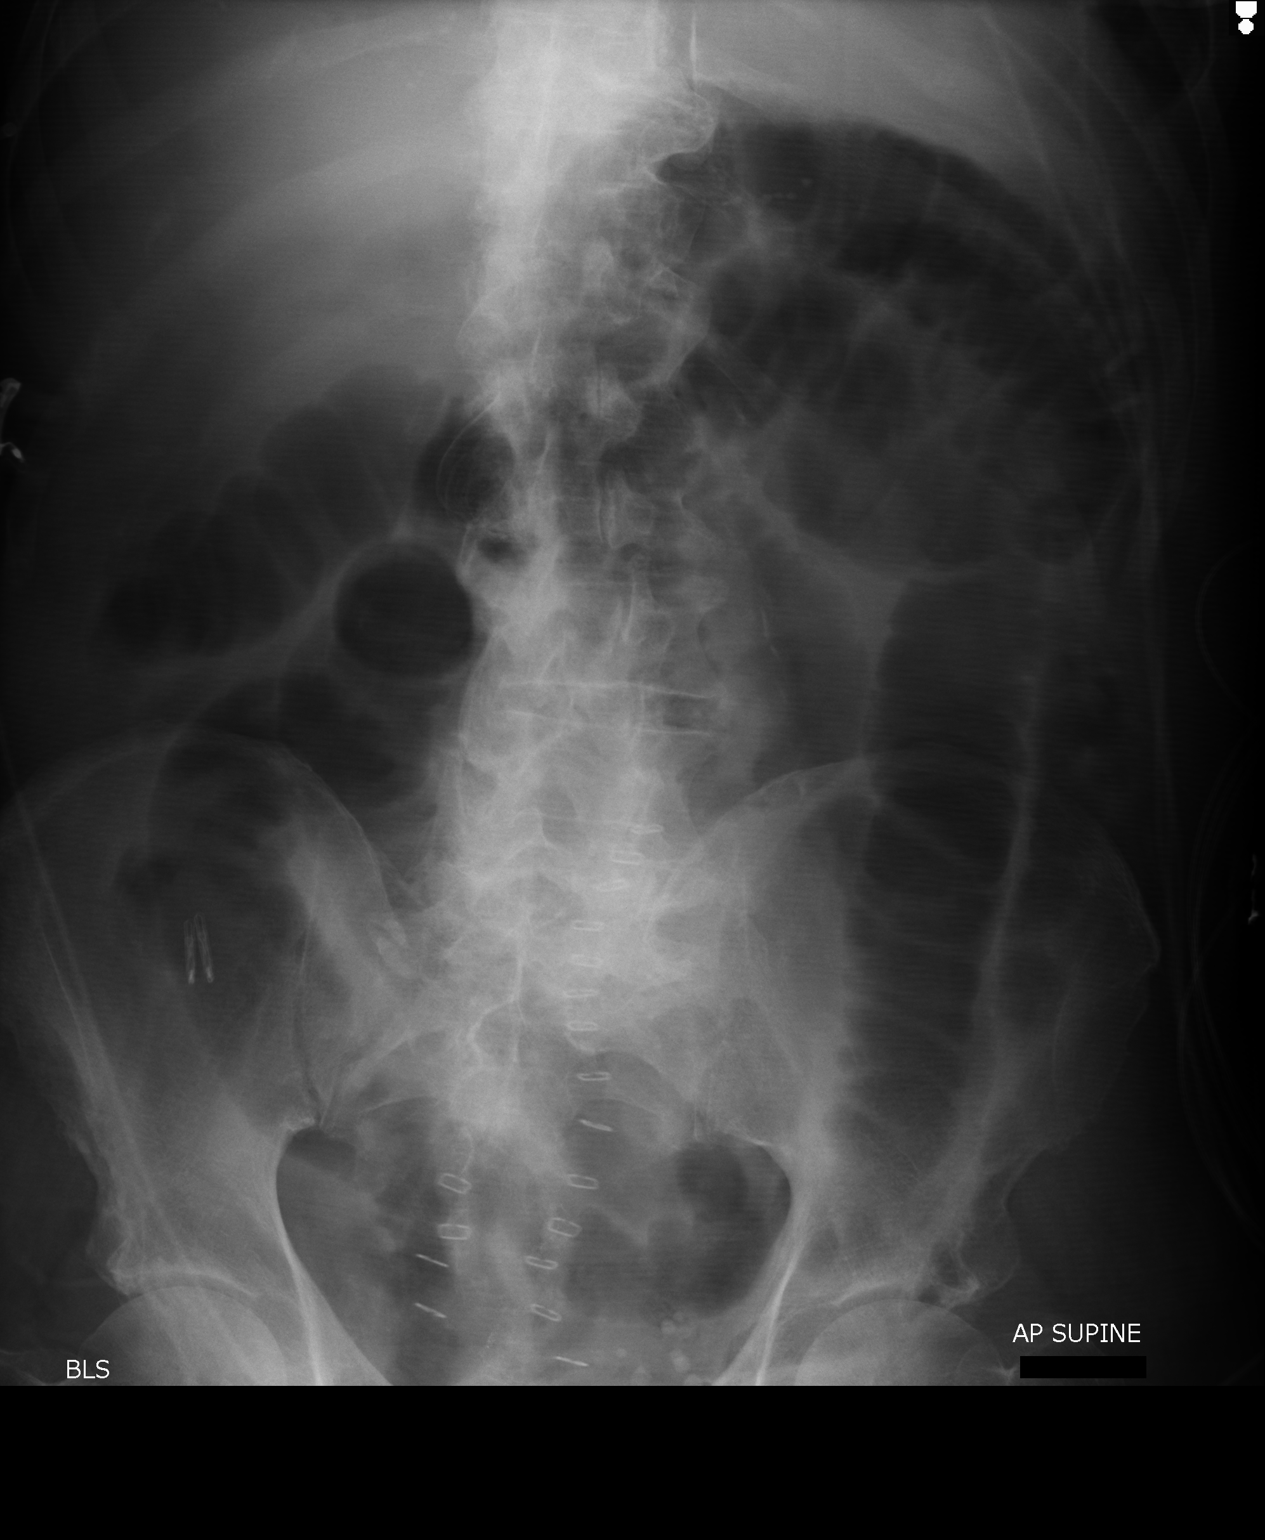

[1 of 1 positions shown; findings below may reference images not displayed]

FINDINGS: Midline skin staples. Multiple dilated small bowel loops throughout
the abdomen, similar in number and degree of dilatation since
previous exam. Endoscopic clips in the right lower quadrant. Left
pelvic phleboliths. Atheromatous aorta. Spondylitic changes in the
lumbar spine.

Regional bones unremarkable.
IMPRESSION: 1. Little change in small bowel dilatation since previous day's
exam.

## 2016-05-13 ENCOUNTER — Telehealth: Payer: Self-pay | Admitting: *Deleted

## 2016-05-13 NOTE — Telephone Encounter (Signed)
pts daughter was called and given Dr.Cooks feedback. She gave verbal understanding.

## 2016-05-13 NOTE — Telephone Encounter (Signed)
Patient's daughter requested a call in reference to medication combinations  Daughter Vincent Peyer [phone]

## 2016-05-13 NOTE — Telephone Encounter (Signed)
Has known CAD. Was put on it by cardiology.

## 2016-05-13 NOTE — Telephone Encounter (Signed)
Pt's wife and daughter questioning why he needs to take the Imdur. She states that he does not have high bp or chest pain. Please cb [phone]

## 2016-05-13 NOTE — Telephone Encounter (Signed)
Pt daughter was told per PCP that pt needs to take Imdur. PCP was okay if pt did not want to take iron supplement along with B-12 supplement.

## 2016-05-26 DIAGNOSIS — H353221 Exudative age-related macular degeneration, left eye, with active choroidal neovascularization: Secondary | ICD-10-CM | POA: Diagnosis not present

## 2016-06-21 ENCOUNTER — Ambulatory Visit: Payer: Commercial Managed Care - HMO | Admitting: Family Medicine

## 2016-06-21 ENCOUNTER — Telehealth: Payer: Self-pay | Admitting: Family Medicine

## 2016-06-21 NOTE — Telephone Encounter (Signed)
I called and left pt a vm to call office to sch AWV. Thank you!

## 2016-07-02 ENCOUNTER — Ambulatory Visit (INDEPENDENT_AMBULATORY_CARE_PROVIDER_SITE_OTHER): Payer: Medicare HMO

## 2016-07-02 ENCOUNTER — Ambulatory Visit (INDEPENDENT_AMBULATORY_CARE_PROVIDER_SITE_OTHER): Payer: Medicare HMO | Admitting: Podiatry

## 2016-07-02 ENCOUNTER — Encounter: Payer: Self-pay | Admitting: Podiatry

## 2016-07-02 VITALS — Resp 16

## 2016-07-02 DIAGNOSIS — H353221 Exudative age-related macular degeneration, left eye, with active choroidal neovascularization: Secondary | ICD-10-CM | POA: Diagnosis not present

## 2016-07-02 DIAGNOSIS — R52 Pain, unspecified: Secondary | ICD-10-CM

## 2016-07-02 DIAGNOSIS — G5792 Unspecified mononeuropathy of left lower limb: Secondary | ICD-10-CM

## 2016-07-02 DIAGNOSIS — M79672 Pain in left foot: Secondary | ICD-10-CM | POA: Diagnosis not present

## 2016-07-02 DIAGNOSIS — M19072 Primary osteoarthritis, left ankle and foot: Secondary | ICD-10-CM

## 2016-07-02 MED ORDER — NONFORMULARY OR COMPOUNDED ITEM: 1.0000 g | Freq: Four times a day (QID) | 2 refills | Status: DC

## 2016-07-02 NOTE — Progress Notes (Signed)
Patient ID: Douglas Ramsey, male   DOB: 10-07-1918, 83 y.o.   MRN: [id number]   Subjective:  83 year old otherwise healthy male presents today for evaluation of left foot pain has been going on for approximately one week. He states that he gets this electric shock sensation in his left foot. It is more noticeable at night when he started to go to bed. Patient denies trauma. Patient states that the electrical shock occurs several times throughout the day and evening.    Objective/Physical Exam General: The patient is alert and oriented x3 in no acute distress.  Dermatology: Skin is warm, dry and supple bilateral lower extremities. Negative for open lesions or macerations.  Vascular: Palpable pedal pulses bilaterally. No edema or erythema noted. Capillary refill within normal limits.  Neurological: Epicritic and protective threshold grossly intact bilaterally.   Musculoskeletal Exam: Positive Tinel sign overlying the first metatarsal cuneiform joint with percussion. Percussion of the first metatarsal cuneiform joint elicits the electrical shock sensation that he feels.   Midfoot arthritis noted with bone spur formation to the bilateral feet at the tarsometatarsal joint. Range of motion within normal limits to all pedal and ankle joints bilateral. Muscle strength 5/5 in all groups bilateral.   Radiographic Exam:  DJD with late onset arthritis to the pedal joints bilateral feet is noted.  Assessment: #1 DJD bilateral feet with arthritis #2 neuritis left foot   Plan of Care:  #1 Patient was evaluated. #2 prescription for peripheral neuropathy pain cream dispensed through [facility] #3 today padding was dispensed to offload and pat the irritated nerve #4 return to clinic in 4 weeks   Edrick Kins, DPM [facility]  Dr. Edrick Kins, [address]                                        [place], [place] [address]                Office [phone]    Fax [phone]

## 2016-07-02 NOTE — Addendum Note (Signed)
Addended by: Johnnye Lana A on: 07/02/2016 04:50 PM   Modules accepted: Orders

## 2016-07-02 NOTE — Progress Notes (Signed)
   Subjective:    Patient ID: Douglas Ramsey, male    DOB: 1918/05/21, 83 y.o.   MRN: [id number]  HPI    Review of Systems     Objective:   Physical Exam        Assessment & Plan:

## 2016-07-09 ENCOUNTER — Ambulatory Visit (INDEPENDENT_AMBULATORY_CARE_PROVIDER_SITE_OTHER): Payer: Commercial Managed Care - HMO

## 2016-07-09 ENCOUNTER — Other Ambulatory Visit: Payer: Self-pay | Admitting: Family Medicine

## 2016-07-09 VITALS — BP 110/70 | HR 82 | Temp 97.8°F | Resp 14 | Ht 69.0 in | Wt 162.8 lb

## 2016-07-09 DIAGNOSIS — Z0000 Encounter for general adult medical examination without abnormal findings: Secondary | ICD-10-CM | POA: Diagnosis not present

## 2016-07-09 MED ORDER — IPRATROPIUM BROMIDE 0.03 % NA SOLN: 2.0000 | Freq: Three times a day (TID) | NASAL | 12 refills | Status: DC

## 2016-07-09 MED ORDER — VENTOLIN HFA 108 (90 BASE) MCG/ACT IN AERS: 1.0000 | INHALATION_SPRAY | RESPIRATORY_TRACT | 3 refills | Status: DC | PRN

## 2016-07-09 MED ORDER — SIMVASTATIN 20 MG PO TABS: 20.0000 mg | ORAL_TABLET | Freq: Every day | ORAL | 3 refills | Status: DC

## 2016-07-09 MED ORDER — FLUTICASONE-SALMETEROL 250-50 MCG/DOSE IN AEPB: 1.0000 | INHALATION_SPRAY | Freq: Two times a day (BID) | RESPIRATORY_TRACT | 11 refills | Status: DC

## 2016-07-09 MED ORDER — MIRABEGRON ER 25 MG PO TB24: 25.0000 mg | ORAL_TABLET | Freq: Every day | ORAL | 3 refills | Status: DC

## 2016-07-09 NOTE — Patient Instructions (Addendum)
  Mr. Tajima , Thank you for taking time to come for your Medicare Wellness Visit. I appreciate your ongoing commitment to your health goals. Please review the following plan we discussed and let me know if I can assist you in the future.   Follow up with Dr. Lacinda Axon as needed.  These are the goals we discussed: Goals    . Increase water intake          Stay hydrated and drink plenty of fluids/water Drink caffeine free tea       This is a list of the screening recommended for you and due dates:  Health Maintenance  Topic Date Due  . Pneumonia vaccines (2 of 2 - PPSV23) 04/16/2016  . Flu Shot  07/05/2017*  . Tetanus Vaccine  04/16/2025  *Topic was postponed. The date shown is not the original due date.

## 2016-07-09 NOTE — Progress Notes (Signed)
Subjective:   Douglas Ramsey is a 83 y.o. male who presents for an Initial Medicare Annual Wellness Visit.  Review of Systems  No ROS.  Medicare Wellness Visit.  Cardiac Risk Factors include: advanced age (>70men, >56 women);male gender    Objective:    Today's Vitals   07/09/16 1520  BP: 110/70  Pulse: 82  Resp: 14  Temp: 97.8 F (36.6 C)  TempSrc: Oral  SpO2: 95%  Weight: 162 lb 12.8 oz (73.8 kg)  Height: 5\' 9"  (1.753 m)   Body mass index is 24.04 kg/m.  Current Medications (verified) Outpatient Encounter Prescriptions as of 07/09/2016  Medication Sig  . Ascorbic Acid (VITAMIN C) 100 MG tablet Take 100 mg by mouth daily.  Marland Kitchen aspirin 81 MG tablet Take 81 mg by mouth daily.  . Azelastine-Fluticasone 137-50 MCG/ACT SUSP 1 spray per nostril twice daily.  . Calcium Carb-Cholecalciferol (CALCIUM 600 + D PO) Take 1 tablet by mouth daily.  . diphenoxylate-atropine (LOMOTIL) 2.5-0.025 MG per tablet Take 1 tablet by mouth 4 (four) times daily as needed for diarrhea or loose stools.  . Ferrous Sulfate 143 (45 FE) MG TBCR Take by mouth daily.  . Fluticasone-Salmeterol (ADVAIR DISKUS) 250-50 MCG/DOSE AEPB Inhale 1 puff into the lungs 2 (two) times daily.  Marland Kitchen glucosamine-chondroitin 500-400 MG tablet Take 1 tablet by mouth daily.  Marland Kitchen ipratropium (ATROVENT) 0.03 % nasal spray Place 2 sprays into both nostrils 3 (three) times daily.  . isosorbide mononitrate (IMDUR) 30 MG 24 hr tablet Take 1 tablet by mouth daily.  . Multiple Vitamin (MULTIVITAMIN) tablet Take 1 tablet by mouth daily.  . nitroGLYCERIN (NITROSTAT) 0.4 MG SL tablet Place 1 tablet (0.4 mg total) under the tongue every 5 (five) minutes as needed for chest pain.  . NONFORMULARY OR COMPOUNDED ITEM Apply 1-2 g topically 4 (four) times daily. Peripheral Neuropathy cream: Bupivacaine 1% Doxepin 3% Gabapentin 6% Ibuprofen 3% Pentoxifylline 3%  . Omega-3 Fatty Acids (FISH OIL DOUBLE STRENGTH) 1200 MG CAPS Take by mouth at bedtime.    Marland Kitchen POTASSIUM PO Take 1 tablet by mouth daily.  . simvastatin (ZOCOR) 20 MG tablet Take 1 tablet (20 mg total) by mouth daily.  . VENTOLIN HFA 108 (90 Base) MCG/ACT inhaler Inhale 1-2 puffs into the lungs every 4 (four) hours as needed.  . vitamin B-12 (CYANOCOBALAMIN) 100 MCG tablet Take 100 mcg by mouth daily.  . Vitamins A & D (VITAMIN A & D) 10000-400 units TABS   . [DISCONTINUED] Fluticasone-Salmeterol (ADVAIR DISKUS) 250-50 MCG/DOSE AEPB Inhale 1 puff into the lungs 2 (two) times daily.  . [DISCONTINUED] Fluticasone-Salmeterol (ADVAIR DISKUS) 250-50 MCG/DOSE AEPB Inhale 1 puff into the lungs 2 (two) times daily.  . [DISCONTINUED] ipratropium (ATROVENT) 0.03 % nasal spray Place 2 sprays into both nostrils 3 (three) times daily.  . [DISCONTINUED] simvastatin (ZOCOR) 20 MG tablet Take 20 mg by mouth daily.  . [DISCONTINUED] VENTOLIN HFA 108 (90 Base) MCG/ACT inhaler Inhale 1-2 puffs into the lungs every 4 (four) hours as needed.  . mirabegron ER (MYRBETRIQ) 25 MG TB24 tablet Take 1 tablet (25 mg total) by mouth daily.   No facility-administered encounter medications on file as of 07/09/2016.     Allergies (verified) Patient has no known allergies.   History: Past Medical History:  Diagnosis Date  . Anemia   . CAD (coronary artery disease)    s/p MI and PCI  . CKD (chronic kidney disease)   . Colon cancer ([place])   .  COPD (chronic obstructive pulmonary disease) ([place])   . Hyperlipidemia   . Rhinitis    Past Surgical History:  Procedure Laterality Date  . COLON SURGERY     ostomy (reversed in december)  . COLONOSCOPY  05/13/2004  . CORONARY ANGIOPLASTY  02/2007  . ESOPHAGOGASTRODUODENOSCOPY  05/11/2014  . FLEXIBLE SIGMOIDOSCOPY  05/14/2014   Family History  Problem Relation Age of Onset  . Heart disease Father   . Heart disease Brother    Social History   Occupational History  . Not on file.   Social History Main Topics  . Smoking status: Never Smoker  . Smokeless  tobacco: Never Used  . Alcohol use No  . Drug use: No  . Sexual activity: No   Tobacco Counseling Counseling given: Not Answered   Activities of Daily Living In your present state of health, do you have any difficulty performing the following activities: 07/09/2016 07/24/2015  Hearing? Tempie Donning  Vision? N N  Difficulty concentrating or making decisions? N Y  Walking or climbing stairs? Y Y  Dressing or bathing? N Y  Doing errands, shopping? N Y  Conservation officer, nature and eating ? N -  Using the Toilet? N -  In the past six months, have you accidently leaked urine? Y -  Do you have problems with loss of bowel control? N -  Managing your Medications? N -  Managing your Finances? N -  Housekeeping or managing your Housekeeping? N -  Some recent data might be hidden    Immunizations and Health Maintenance  There is no immunization history on file for this patient. Health Maintenance Due  Topic Date Due  . PNA vac Low Risk Adult (2 of 2 - PPSV23) 04/16/2016    Patient Care Team: Coral Spikes, DO as PCP - General (Family Medicine)  Indicate any recent Medical Services you may have received from other than [facility] providers in the past year (date may be approximate).    Assessment:   This is a routine wellness examination for [place]. The goal of the wellness visit is to assist the patient how to close the gaps in care and create a preventative care plan for the patient.   Taking calcium VIT D as appropriate/Osteoporosis risk reviewed.  Medications reviewed; taking without issues or barriers.  Safety issues reviewed; smoke detectors in the home. No firearms in the home. Wears seatbelts when driving or riding with others. No violence in the home.  No identified risk were noted; The patient was oriented x 3; appropriate in dress and manner and no objective failures at ADL's or IADL's.   BMI; discussed the importance of a healthy diet, water intake and exercise. Educational material  provided.  Patient Concerns: None at this time. Follow up with PCP as needed.  Hearing/Vision screen Hearing Screening Comments: Wears hearing aids    Vision Screening Comments: Followed by [facility] Wears corrective lenses Last OV 06/2016 Macular degeneration Visual acuity not assessed per patient preference since he has regular follow up with his ophthalmologist.  Dietary issues and exercise activities discussed: Current Exercise Habits: The patient does not participate in regular exercise at present  Goals    . Increase water intake          Stay hydrated and drink plenty of fluids/water Drink caffeine free tea      Depression Screen PHQ 2/9 Scores 07/09/2016 04/19/2016  PHQ - 2 Score 0 0    Fall Risk Fall Risk  07/09/2016 04/19/2016  Falls in the past year? No No    Cognitive Function:     6CIT Screen 07/09/2016  What Year? 0 points  What month? 0 points  What time? 0 points  Count back from 20 0 points  Months in reverse 0 points  Repeat phrase 0 points  Total Score 0    Screening Tests Health Maintenance  Topic Date Due  . PNA vac Low Risk Adult (2 of 2 - PPSV23) 04/16/2016  . INFLUENZA VACCINE  07/05/2017 (Originally 12/16/2015)  . TETANUS/TDAP  04/16/2025        Plan:    End of life planning; Advance aging; Advanced directives discussed. Copy of current HCPOA/Living Will requested.  Medicare Attestation I have personally reviewed: The patient's medical and social history Their use of alcohol, tobacco or illicit drugs Their current medications and supplements The patient's functional ability including ADLs,fall risks, home safety risks, cognitive, and hearing and visual impairment Diet and physical activities Evidence for depression   The patient's weight, height, BMI, and visual acuity have been recorded in the chart.  I have made referrals and provided education to the patient based on review of the above and I have provided the  patient with a written personalized care plan for preventive services.    During the course of the visit Addie was educated and counseled about the following appropriate screening and preventive services:   Vaccines to include Pneumoccal, Influenza, Hepatitis B, Td, Zostavax, HCV  Colorectal cancer screening  Cardiovascular disease screening  Diabetes screening  Glaucoma screening  Nutrition counseling  Prostate cancer screening  Smoking cessation counseling  Patient Instructions (the written plan) were given to the patient.   Varney Biles, LPN   [id number]

## 2016-07-11 NOTE — Progress Notes (Signed)
Care was provided under my supervision. I agree with the management as indicated in the note.  Gray Doering DO  

## 2016-07-24 NOTE — Progress Notes (Signed)
* [facility]     Assessment and Plan:  COPD.  --Given sample of anoro to use in place of advair for 2 weeks. If notes that it helps he is asked to call for a script.   Dyspnea.  --Exertional dyspnea, secondary to COPD/emphysema. This appears to be chronic area. -Discussed that we will try using the Anoro as above, otherwise I would not try other medications for this as this is likely a chronic problem area  Dementia -The patient is functional, and lives independently, however, he does have difficulties in remembering medical instructions, likely complicating his therapy.   Date: 07/24/2016  MRN# [id number] CLARON ROSENCRANS [birth year]   LAURIER JASPERSON is a 83 y.o. old male seen in follow up for chief complaint of  Chief Complaint  Patient presents with  . Advice Only    prev VM pt: SOB w/activity: denies cough or chest tightness:     Brief History: 83 yo F with Stage II COPD, previous [facility] patient. He was noted to have poor inhaler compliance, going between ventolin and advair. At last visit with Dr. Stevenson Clinch he was advised to use advair regularly.  Currently he is using advair twice daily, but does not feel that it helps. He has tried spiriva in the past and feels that it helped.   When discussing his advair use he notes that he just puts in his mouth, so that "the gas goes in my lungs". Not sure that he is actually inhaling it.   He notes that when he walks to put his recycling away every week he runs out of breath. He notes that this happens when he does some activity, and this has been going on for a few years. He has not gained weight in that time.   His wifes son is also present and gives much of the history. He notes that he is active in [employer] and he is gardens regularly.   Approximately 30 minutes was spent in discussing proper medication use.    Medication:   Outpatient Encounter Prescriptions as of 07/26/2016  Medication  Sig  . Ascorbic Acid (VITAMIN C) 100 MG tablet Take 100 mg by mouth daily.  Marland Kitchen aspirin 81 MG tablet Take 81 mg by mouth daily.  . Azelastine-Fluticasone 137-50 MCG/ACT SUSP 1 spray per nostril twice daily.  . Calcium Carb-Cholecalciferol (CALCIUM 600 + D PO) Take 1 tablet by mouth daily.  . diphenoxylate-atropine (LOMOTIL) 2.5-0.025 MG per tablet Take 1 tablet by mouth 4 (four) times daily as needed for diarrhea or loose stools.  . Ferrous Sulfate 143 (45 FE) MG TBCR Take by mouth daily.  . Fluticasone-Salmeterol (ADVAIR DISKUS) 250-50 MCG/DOSE AEPB Inhale 1 puff into the lungs 2 (two) times daily.  Marland Kitchen glucosamine-chondroitin 500-400 MG tablet Take 1 tablet by mouth daily.  Marland Kitchen ipratropium (ATROVENT) 0.03 % nasal spray Place 2 sprays into both nostrils 3 (three) times daily.  . isosorbide mononitrate (IMDUR) 30 MG 24 hr tablet Take 1 tablet by mouth daily.  . mirabegron ER (MYRBETRIQ) 25 MG TB24 tablet Take 1 tablet (25 mg total) by mouth daily.  . Multiple Vitamin (MULTIVITAMIN) tablet Take 1 tablet by mouth daily.  . nitroGLYCERIN (NITROSTAT) 0.4 MG SL tablet Place 1 tablet (0.4 mg total) under the tongue every 5 (five) minutes as needed for chest pain.  . NONFORMULARY OR COMPOUNDED ITEM Apply 1-2 g topically 4 (four) times daily. Peripheral Neuropathy cream: Bupivacaine 1% Doxepin 3% Gabapentin 6%  Ibuprofen 3% Pentoxifylline 3%  . Omega-3 Fatty Acids (FISH OIL DOUBLE STRENGTH) 1200 MG CAPS Take by mouth at bedtime.  Marland Kitchen POTASSIUM PO Take 1 tablet by mouth daily.  . simvastatin (ZOCOR) 20 MG tablet Take 1 tablet (20 mg total) by mouth daily.  . VENTOLIN HFA 108 (90 Base) MCG/ACT inhaler Inhale 1-2 puffs into the lungs every 4 (four) hours as needed.  . vitamin B-12 (CYANOCOBALAMIN) 100 MCG tablet Take 100 mcg by mouth daily.  . Vitamins A & D (VITAMIN A & D) 10000-400 units TABS    No facility-administered encounter medications on file as of 07/26/2016.      Allergies:  Patient has no known  allergies.  Review of Systems: Gen:  Denies  fever, sweats. HEENT: Denies blurred vision. Cvc:  No dizziness, chest pain or heaviness Resp:   Denies cough or sputum porduction. Gi: Denies swallowing difficulty, stomach pain. constipation, bowel incontinence Gu:  Denies bladder incontinence, burning urine Ext:   No Joint pain, stiffness. Skin: No skin rash, easy bruising. Endoc:  No polyuria, polydipsia. Psych: No depression, insomnia. Other:  All other systems were reviewed and found to be negative other than what is mentioned in the HPI.   Physical Examination:   VS: BP 118/70 (BP Location: Left Arm, Cuff Size: Normal)   Pulse 84   Wt 164 lb (74.4 kg)   SpO2 97%   BMI 24.22 kg/m   General Appearance: No distress  Neuro:without focal findings,  speech normal,  HEENT: PERRLA, EOM intact. Pulmonary: normal breath sounds, No wheezing.  Decreased air entry bilaterally.  CardiovascularNormal S1,S2.  No m/r/g.   Abdomen: Benign, Soft, non-tender. Renal:  No costovertebral tenderness  GU:  Not performed at this time. Endoc: No evident thyromegaly, no signs of acromegaly. Skin:   warm, no rash. Extremities: normal, no cyanosis, clubbing.   LABORATORY PANEL:   CBC No results for input(s): WBC, HGB, HCT, PLT in the last 168 hours. ------------------------------------------------------------------------------------------------------------------  Chemistries  No results for input(s): NA, K, CL, CO2, GLUCOSE, BUN, CREATININE, CALCIUM, MG, AST, ALT, ALKPHOS, BILITOT in the last 168 hours.  Invalid input(s): GFRCGP ------------------------------------------------------------------------------------------------------------------  Cardiac Enzymes No results for input(s): TROPONINI in the last 168 hours. ------------------------------------------------------------  RADIOLOGY:   No results found for this or any previous visit. Results for orders placed during the hospital  encounter of 01/30/16  DG Chest 2 View   Narrative CLINICAL DATA:  Shortness of breath, worsening the past few months. COPD.  EXAM: CHEST  2 VIEW  COMPARISON:  None.  FINDINGS: Heart is normal size. There is hyperinflation of the lungs compatible with COPD. Areas of scarring in the mid lungs bilaterally. Previously seen multifocal consolidation has resolved. No effusions. No acute bony abnormality.  IMPRESSION: Interval resolution of the previously seen multifocal pneumonia. Areas of scarring in the mid lungs. COPD.   Electronically Signed   By: Rolm Baptise M.D.   On: 01/30/2016 13:16    ------------------------------------------------------------------------------------------------------------------  Thank  you for allowing [facility] to assist in the care of your patient. Our recommendations are noted above.  Please contact us if we can be of further service.   Marda Stalker, MD.  [facility] Office Number: [phone]  Patricia Pesa, M.D.  Vilinda Boehringer, M.D.  [place] Border, M.D  07/24/2016

## 2016-07-26 ENCOUNTER — Encounter: Payer: Self-pay | Admitting: Internal Medicine

## 2016-07-26 ENCOUNTER — Ambulatory Visit (INDEPENDENT_AMBULATORY_CARE_PROVIDER_SITE_OTHER): Payer: Medicare HMO | Admitting: Internal Medicine

## 2016-07-26 VITALS — BP 118/70 | HR 84 | Wt 164.0 lb

## 2016-07-26 DIAGNOSIS — J439 Emphysema, unspecified: Secondary | ICD-10-CM

## 2016-07-26 NOTE — Patient Instructions (Addendum)
--  Use anoro once daily in place of the advair.  --If it helps call us for a prescription.

## 2016-07-30 ENCOUNTER — Ambulatory Visit: Payer: Medicare HMO | Admitting: Podiatry

## 2016-08-04 ENCOUNTER — Other Ambulatory Visit: Payer: Self-pay | Admitting: *Deleted

## 2016-08-04 MED ORDER — UMECLIDINIUM-VILANTEROL 62.5-25 MCG/INH IN AEPB: 1.0000 | INHALATION_SPRAY | Freq: Every day | RESPIRATORY_TRACT | 4 refills | Status: DC

## 2016-08-23 DIAGNOSIS — E782 Mixed hyperlipidemia: Secondary | ICD-10-CM | POA: Diagnosis not present

## 2016-08-23 DIAGNOSIS — R0602 Shortness of breath: Secondary | ICD-10-CM | POA: Diagnosis not present

## 2016-08-23 DIAGNOSIS — R58 Hemorrhage, not elsewhere classified: Secondary | ICD-10-CM | POA: Diagnosis not present

## 2016-08-23 DIAGNOSIS — I493 Ventricular premature depolarization: Secondary | ICD-10-CM | POA: Diagnosis not present

## 2016-08-23 DIAGNOSIS — I340 Nonrheumatic mitral (valve) insufficiency: Secondary | ICD-10-CM | POA: Diagnosis not present

## 2016-08-23 DIAGNOSIS — I2510 Atherosclerotic heart disease of native coronary artery without angina pectoris: Secondary | ICD-10-CM | POA: Diagnosis not present

## 2016-08-24 ENCOUNTER — Other Ambulatory Visit: Payer: Self-pay

## 2016-08-24 MED ORDER — UMECLIDINIUM-VILANTEROL 62.5-25 MCG/INH IN AEPB: 1.0000 | INHALATION_SPRAY | Freq: Every day | RESPIRATORY_TRACT | 4 refills | Status: DC

## 2016-08-24 NOTE — Telephone Encounter (Signed)
Needs rx faxed to [facility] (for patient assistance) [phone] patient # [id number]

## 2016-09-03 ENCOUNTER — Telehealth: Payer: Self-pay | Admitting: Internal Medicine

## 2016-09-03 NOTE — Telephone Encounter (Signed)
Stepdaughter needs [place] to refax rx for Anoro to [place] . Fax [phone]. Acct # [id number]

## 2016-09-06 MED ORDER — UMECLIDINIUM-VILANTEROL 62.5-25 MCG/INH IN AEPB: 1.0000 | INHALATION_SPRAY | Freq: Every day | RESPIRATORY_TRACT | 11 refills | Status: DC

## 2016-09-06 NOTE — Telephone Encounter (Signed)
RX printed and signed and faxed to [place] [facility] per daughter's request. Nothing further needed.

## 2016-09-07 ENCOUNTER — Telehealth: Payer: Self-pay | Admitting: Internal Medicine

## 2016-09-07 NOTE — Telephone Encounter (Signed)
Melissa from [facility] mail order calling to get some clarification on medication  She states she has faxed [place] about this They just need to know which one is correct medication patient is to be one Please advise  Anero Ellipta  Or Advair

## 2016-09-07 NOTE — Telephone Encounter (Signed)
Called [employer] and clarified rx. D/C Advair and dispense Anoro.

## 2016-10-05 DIAGNOSIS — H353221 Exudative age-related macular degeneration, left eye, with active choroidal neovascularization: Secondary | ICD-10-CM | POA: Diagnosis not present

## 2016-10-13 DIAGNOSIS — Z85828 Personal history of other malignant neoplasm of skin: Secondary | ICD-10-CM | POA: Diagnosis not present

## 2016-10-13 DIAGNOSIS — L820 Inflamed seborrheic keratosis: Secondary | ICD-10-CM | POA: Diagnosis not present

## 2016-10-13 DIAGNOSIS — L821 Other seborrheic keratosis: Secondary | ICD-10-CM | POA: Diagnosis not present

## 2016-10-13 DIAGNOSIS — D1800 Hemangioma unspecified site: Secondary | ICD-10-CM | POA: Diagnosis not present

## 2016-10-13 DIAGNOSIS — L578 Other skin changes due to chronic exposure to nonionizing radiation: Secondary | ICD-10-CM | POA: Diagnosis not present

## 2016-10-13 DIAGNOSIS — Z1283 Encounter for screening for malignant neoplasm of skin: Secondary | ICD-10-CM | POA: Diagnosis not present

## 2016-10-18 ENCOUNTER — Ambulatory Visit (INDEPENDENT_AMBULATORY_CARE_PROVIDER_SITE_OTHER): Payer: Medicare HMO | Admitting: Family Medicine

## 2016-10-18 ENCOUNTER — Ambulatory Visit: Payer: Commercial Managed Care - HMO | Admitting: Family Medicine

## 2016-10-18 ENCOUNTER — Encounter: Payer: Self-pay | Admitting: Family Medicine

## 2016-10-18 DIAGNOSIS — J449 Chronic obstructive pulmonary disease, unspecified: Secondary | ICD-10-CM

## 2016-10-18 DIAGNOSIS — G4762 Sleep related leg cramps: Secondary | ICD-10-CM | POA: Diagnosis not present

## 2016-10-18 DIAGNOSIS — J300 Vasomotor rhinitis: Secondary | ICD-10-CM

## 2016-10-18 NOTE — Patient Instructions (Signed)
Medications as prescribed.  Follow up in 6 months.  Take care  Dr. Manda Holstad  

## 2016-10-18 NOTE — Progress Notes (Signed)
   Subjective:  Patient ID: Douglas Ramsey, male    DOB: [birth year]  Age: 83 y.o. MRN: [id number]  CC: Follow up  HPI:  83 year old male with an extensive PMH presents for follow up.  Patient has complaints today - See below.  COPD  Reports ST/issues with Anoro.  Wants to discuss discontinuation.  SOB at baseline.  Nocturnal leg cramps  Having ongoing R leg cramps (ankle).  Occur early in the am/late at night.  Improves with walking/activity.  Rhinitis  Still bothersome. No improvement with prior treatments.  Looking for additional options today.  Social Hx   Social History   Social History  . Marital status: Married    Spouse name: N/A  . Number of children: N/A  . Years of education: N/A   Social History Main Topics  . Smoking status: Never Smoker  . Smokeless tobacco: Never Used  . Alcohol use No  . Drug use: No  . Sexual activity: No   Other Topics Concern  . None   Social History Narrative  . None    Review of Systems  HENT: Positive for rhinorrhea.   Respiratory: Positive for shortness of breath.   Musculoskeletal:       Muscle cramps.    Objective:  BP 118/66   Pulse 91   Temp 98.8 F (37.1 C) (Oral)   Resp 16   Ht 5\' 9"  (1.753 m)   Wt 166 lb 2 oz (75.4 kg)   SpO2 97%   BMI 24.53 kg/m   BP/Weight 10/18/2016 07/26/2016 9/70/2637  Systolic BP 858 850 277  Diastolic BP 66 70 70  Wt. (Lbs) 166.13 164 162.8  BMI 24.53 24.22 24.04   Physical Exam  Constitutional: He is oriented to person, place, and time. He appears well-developed. No distress.  Cardiovascular: Normal rate and regular rhythm.   Pulmonary/Chest: Effort normal and breath sounds normal. He has no wheezes. He has no rales.  Neurological: He is alert and oriented to person, place, and time.  Psychiatric: He has a normal mood and affect.  Vitals reviewed.   Lab Results  Component Value Date   WBC 19.3 (H) 08/05/2015   HGB 14.4 08/05/2015   HCT 44.0 08/05/2015   PLT  268 08/05/2015   GLUCOSE 129 (H) 07/28/2015   ALT 22 05/21/2014   AST 34 05/21/2014   NA 140 07/28/2015   K 4.8 07/28/2015   CL 109 07/28/2015   CREATININE 1.29 (H) 07/28/2015   BUN 41 (H) 07/28/2015   CO2 25 07/28/2015   INR 1.1 05/16/2014    Assessment & Plan:   Problem List Items Addressed This Visit      Respiratory   Vasomotor rhinitis    Patient continues to have symptoms. He's had no improvement with treatment. We'll discontinue all treatment. Supportive care.      Moderate COPD (chronic obstructive pulmonary disease) (HCC)    Stable. Patient with adverse effects after administration of Anoro. Discontinuing today. Patient to continue Advair and PRN Albuterol.         Other   Nocturnal leg cramps    New problem. Patient requesting medication. I advised against this. Advised stretching and hydration throughout the day. Family members indicate that he does not stay drink very much throughout the day.         Follow-up: 6 months  [place] DO [facility]

## 2016-10-19 DIAGNOSIS — G4762 Sleep related leg cramps: Secondary | ICD-10-CM | POA: Insufficient documentation

## 2016-10-19 NOTE — Assessment & Plan Note (Signed)
Stable. Patient with adverse effects after administration of Anoro. Discontinuing today. Patient to continue Advair and PRN Albuterol.

## 2016-10-19 NOTE — Assessment & Plan Note (Signed)
Patient continues to have symptoms. He's had no improvement with treatment. We'll discontinue all treatment. Supportive care.

## 2016-10-19 NOTE — Assessment & Plan Note (Signed)
New problem. Patient requesting medication. I advised against this. Advised stretching and hydration throughout the day. Family members indicate that he does not stay drink very much throughout the day.

## 2016-12-07 DIAGNOSIS — H353221 Exudative age-related macular degeneration, left eye, with active choroidal neovascularization: Secondary | ICD-10-CM | POA: Diagnosis not present

## 2016-12-13 DIAGNOSIS — L989 Disorder of the skin and subcutaneous tissue, unspecified: Secondary | ICD-10-CM | POA: Diagnosis not present

## 2016-12-13 DIAGNOSIS — L578 Other skin changes due to chronic exposure to nonionizing radiation: Secondary | ICD-10-CM | POA: Diagnosis not present

## 2016-12-13 DIAGNOSIS — D485 Neoplasm of uncertain behavior of skin: Secondary | ICD-10-CM | POA: Diagnosis not present

## 2016-12-13 DIAGNOSIS — C4442 Squamous cell carcinoma of skin of scalp and neck: Secondary | ICD-10-CM | POA: Diagnosis not present

## 2017-01-12 DIAGNOSIS — L820 Inflamed seborrheic keratosis: Secondary | ICD-10-CM | POA: Diagnosis not present

## 2017-01-12 DIAGNOSIS — L821 Other seborrheic keratosis: Secondary | ICD-10-CM | POA: Diagnosis not present

## 2017-01-12 DIAGNOSIS — L570 Actinic keratosis: Secondary | ICD-10-CM | POA: Diagnosis not present

## 2017-01-12 DIAGNOSIS — L578 Other skin changes due to chronic exposure to nonionizing radiation: Secondary | ICD-10-CM | POA: Diagnosis not present

## 2017-01-12 DIAGNOSIS — Z85828 Personal history of other malignant neoplasm of skin: Secondary | ICD-10-CM | POA: Diagnosis not present

## 2017-01-24 ENCOUNTER — Ambulatory Visit (INDEPENDENT_AMBULATORY_CARE_PROVIDER_SITE_OTHER): Payer: Medicare HMO | Admitting: Internal Medicine

## 2017-01-24 ENCOUNTER — Encounter: Payer: Self-pay | Admitting: Internal Medicine

## 2017-01-24 VITALS — BP 128/80 | HR 86 | Resp 16 | Ht 69.0 in | Wt 162.0 lb

## 2017-01-24 DIAGNOSIS — J439 Emphysema, unspecified: Secondary | ICD-10-CM

## 2017-01-24 NOTE — Progress Notes (Addendum)
* Douglas Ramsey     Assessment and Plan:  83 yo male with emphysema and dyspnea on exertion.   COPD.  --Continue anoro daily which appears to help.   Dyspnea.  --Exertional dyspnea, secondary to COPD/emphysema. This appears to be chronic area. -Discussed that we will try using the Anoro as above, otherwise I would not try other medications for this as this is likely a chronic problem area  Chronic rhinitis.  --continue nasal ipratropium.   Dementia -The patient is functional, and lives independently, however.    Date: 01/24/2017  MRN# [id number] Douglas Ramsey 1919/02/04   Douglas Ramsey is a 83 y.o. old male seen in follow up for chief complaint of  Chief Complaint  Patient presents with  . Follow-up    Pt still has sob. He has seen no benefit on Anoro.     Brief History: 83 yo F with Stage II COPD, previous Douglas Ramsey patient. He was noted to have poor inhaler compliance, going between ventolin and advair. At last visit with he noted that the Spiriva had helped with his breathing, but the Advair that he had currently been on was not helpful. He was therefore given a trial of Anoro, and feels that he is doing well with it.   He has been working in the garden, he gets winded after 5 min will take a break, then get back to it. He has the same problem when he walks to the mail box, his legs will ache will rest on the mailbox then walk back.   His daughter is also present and gives much of the history. He notes that he is active in [employer] and he is gardens regularly.   Desat walk 01/24/17; at rest on RA sat is 92% and HR 82. Walked 200 feet, slow, conversational, left foot limp, sat was 88% and HR 98. After sitting for 21 seconds sat increased to 90.    Medication:   Outpatient Encounter Prescriptions as of 01/24/2017  Medication Sig  . aspirin 81 MG tablet Take 81 mg by mouth daily.  . Fluticasone-Salmeterol (ADVAIR DISKUS) 250-50  MCG/DOSE AEPB Inhale 1 puff into the lungs 2 (two) times daily.  . mirabegron ER (MYRBETRIQ) 25 MG TB24 tablet Take 1 tablet (25 mg total) by mouth daily.  . nitroGLYCERIN (NITROSTAT) 0.4 MG SL tablet Place 1 tablet (0.4 mg total) under the tongue every 5 (five) minutes as needed for chest pain.  . simvastatin (ZOCOR) 20 MG tablet Take 1 tablet (20 mg total) by mouth daily.  . VENTOLIN HFA 108 (90 Base) MCG/ACT inhaler Inhale 1-2 puffs into the lungs every 4 (four) hours as needed.   No facility-administered encounter medications on file as of 01/24/2017.      Allergies:  Patient has no known allergies.  Review of Systems: Gen:  Denies  fever, sweats. HEENT: Denies blurred vision. Cvc:  No dizziness, chest pain or heaviness Resp:   Denies cough or sputum porduction. Gi: Denies swallowing difficulty, stomach pain. constipation, bowel incontinence Gu:  Denies bladder incontinence, burning urine Ext:   No Joint pain, stiffness. Skin: No skin rash, easy bruising. Endoc:  No polyuria, polydipsia. Psych: No depression, insomnia. Other:  All other systems were reviewed and found to be negative other than what is mentioned in the HPI.   Physical Examination:   VS: BP 128/80 (BP Location: Left Arm, Cuff Size: Normal)   Pulse 86   Resp 16   Ht  5\' 9"  (1.753 m)   Wt 162 lb (73.5 kg)   SpO2 95%   BMI 23.92 kg/m   General Appearance: No distress  Neuro:without focal findings,  speech normal,  HEENT: PERRLA, EOM intact. Pulmonary: Scattered bibasilar crackles No wheezing.  Decreased air entry bilaterally.  CardiovascularNormal S1,S2.  No m/r/g.   Abdomen: Benign, Soft, non-tender. Renal:  No costovertebral tenderness  GU:  Not performed at this time. Endoc: No evident thyromegaly, no signs of acromegaly. Skin:   warm, no rash. Extremities: normal, no cyanosis, clubbing.   LABORATORY PANEL:   CBC No results for input(s): WBC, HGB, HCT, PLT in the last 168  hours. ------------------------------------------------------------------------------------------------------------------  Chemistries  No results for input(s): NA, K, CL, CO2, GLUCOSE, BUN, CREATININE, CALCIUM, MG, AST, ALT, ALKPHOS, BILITOT in the last 168 hours.  Invalid input(s): GFRCGP ------------------------------------------------------------------------------------------------------------------  Cardiac Enzymes No results for input(s): TROPONINI in the last 168 hours. ------------------------------------------------------------  RADIOLOGY:   No results found for this or any previous visit. Results for orders placed during the hospital encounter of 01/30/16  DG Chest 2 View   Narrative CLINICAL DATA:  Shortness of breath, worsening the past few months. COPD.  EXAM: CHEST  2 VIEW  COMPARISON:  None.  FINDINGS: Heart is normal size. There is hyperinflation of the lungs compatible with COPD. Areas of scarring in the mid lungs bilaterally. Previously seen multifocal consolidation has resolved. No effusions. No acute bony abnormality.  IMPRESSION: Interval resolution of the previously seen multifocal pneumonia. Areas of scarring in the mid lungs. COPD.   Electronically Signed   By: Rolm Baptise M.D.   On: 01/30/2016 13:16    ------------------------------------------------------------------------------------------------------------------  Thank  you for allowing [facility] to assist in the care of your patient. Our recommendations are noted above.  Please contact Douglas Ramsey if we can be of further service.   Marda Stalker, MD.  [facility] Office Number: [phone]  Patricia Pesa, M.D.  Vilinda Boehringer, M.D.  Merton Border, M.D  01/24/2017

## 2017-01-24 NOTE — Patient Instructions (Signed)
Continue anoro once daily.   Continue ipratropium nasal spray as often as 4 times daily.

## 2017-02-03 DIAGNOSIS — H353221 Exudative age-related macular degeneration, left eye, with active choroidal neovascularization: Secondary | ICD-10-CM | POA: Diagnosis not present

## 2017-03-04 ENCOUNTER — Telehealth: Payer: Self-pay | Admitting: Internal Medicine

## 2017-03-04 NOTE — Telephone Encounter (Signed)
Pt wife states he is SOB and would like a call. States this has been going on for several months. Please call. She request to call after 2 pm.

## 2017-03-04 NOTE — Telephone Encounter (Signed)
You seen pt 01/24/17 with same complaints of SOB which wife states he has had for several months. Please advise on message below.

## 2017-03-04 NOTE — Telephone Encounter (Signed)
Call went to voicemail. He was seen for this and discussed extensively. This is unlikely to get better. When I saw him I started him on Anoro to see if it helped. If not then can stop it. If further questions please have them come in.

## 2017-03-07 ENCOUNTER — Ambulatory Visit (INDEPENDENT_AMBULATORY_CARE_PROVIDER_SITE_OTHER): Payer: Medicare HMO | Admitting: Internal Medicine

## 2017-03-07 ENCOUNTER — Encounter: Payer: Self-pay | Admitting: Internal Medicine

## 2017-03-07 VITALS — BP 116/84 | HR 85 | Ht 69.0 in | Wt 164.0 lb

## 2017-03-07 DIAGNOSIS — J449 Chronic obstructive pulmonary disease, unspecified: Secondary | ICD-10-CM | POA: Diagnosis not present

## 2017-03-07 MED ORDER — IPRATROPIUM-ALBUTEROL 0.5-2.5 (3) MG/3ML IN SOLN: 3.0000 mL | Freq: Three times a day (TID) | RESPIRATORY_TRACT | 5 refills | Status: DC

## 2017-03-07 NOTE — Telephone Encounter (Signed)
Pt has been scheduled to be been seen today 01/05/17.

## 2017-03-07 NOTE — Addendum Note (Signed)
Addended by: Laverle Hobby on: 03/07/2017 12:24 PM   Modules accepted: Orders

## 2017-03-07 NOTE — Addendum Note (Signed)
Addended by: Stephanie Coup on: 03/07/2017 12:23 PM   Modules accepted: Orders

## 2017-03-07 NOTE — Patient Instructions (Addendum)
--  Will start nebulized albuterol and ipratropium, stop Anoro inhaler.  --Will refer to pulmonary rehab after PFT.

## 2017-03-07 NOTE — Telephone Encounter (Signed)
Number given with phone message is incorrect. Called [phone] and had to [facility] for pt or wife to call me back.

## 2017-03-07 NOTE — Progress Notes (Addendum)
* [facility]     Assessment and Plan:  83 yo male with emphysema and dyspnea on exertion.   COPD with severe dyspnea on exertion.  --Discontinue anoro, start duo nebs --Referred to pulmonary rehab.  Dyspnea.  --Exertional dyspnea, secondary to COPD/emphysema. This appears to be chronic. -Discussed that we will try using the duo nebs 3 times daily. -Discussed that this is likely a chronic long-term problem, and may not get better.  Chronic rhinitis.  --continue nasal ipratropium.   Dementia -The patient is functional, and lives independently.   Date: 03/07/2017  MRN# [id number] Douglas Ramsey November 06, 1918   Douglas Ramsey is a 83 y.o. old male seen in follow up for chief complaint of  Chief Complaint  Patient presents with  . Acute Visit    SOB w/activity:      Brief History: 84 yo F with Stage II COPD, previous [facility] patient. He was noted to have poor inhaler compliance, going between ventolin and advair. At last visit with me he noted that the  Anoro, had helped his breathing, therefore it was continues.   Since his last visit he noticed that he continues to be winded with mild activity, he continues to have this. He continues to have the dyspnea with mild yard work and walking to [employer].   He continues to use Anoro every morning. He uses no other inhalers, or a nebulizer.   His caregiver and wife are present and gives some of the history. He notes that he is active in [employer] and he is gardens regularly.   Desat walk 03/07/17; baseline sat on RA at rest was 93% and HR 85. Walked 300 feet at  Brisk pace, quickly became winded, sat was 90% and HR 94.   Desat walk 01/24/17; at rest on RA sat is 92% and HR 82. Walked 200 feet, slow, conversational, left foot limp, sat was 88% and HR 98. After sitting for 21 seconds sat increased to 90.    Medication:   Outpatient Encounter Prescriptions as of 03/07/2017  Medication Sig    . Aluminum & Magnesium Hydroxide (MAGNESIUM-ALUMINUM PO) Take 1 tablet by mouth daily.  Marland Kitchen ascorbic acid (VITAMIN C) 100 MG tablet Take 100 mg by mouth daily.  Marland Kitchen aspirin 81 MG tablet Take 81 mg by mouth daily.  . Calcium Carbonate (CALCIUM 600 PO) Take 1 tablet by mouth daily.  . Cyanocobalamin (VITAMIN B 12 PO) Take 1 tablet by mouth daily.  Marland Kitchen glucosamine-chondroitin 500-400 MG tablet Take 1 tablet by mouth 3 (three) times daily.  . IRON CR PO Take 1 tablet by mouth daily.  . mirabegron ER (MYRBETRIQ) 25 MG TB24 tablet Take 1 tablet (25 mg total) by mouth daily.  . nitroGLYCERIN (NITROSTAT) 0.4 MG SL tablet Place 1 tablet (0.4 mg total) under the tongue every 5 (five) minutes as needed for chest pain.  Marland Kitchen Potassium 95 MG TABS Take 1 tablet by mouth daily.  . simvastatin (ZOCOR) 20 MG tablet Take 1 tablet (20 mg total) by mouth daily.  Marland Kitchen Specialty Vitamins Products (MAGNESIUM, AMINO ACID CHELATE,) 133 MG tablet Take 1 tablet by mouth 2 (two) times daily.  . VENTOLIN HFA 108 (90 Base) MCG/ACT inhaler Inhale 1-2 puffs into the lungs every 4 (four) hours as needed.  Marland Kitchen VITAMIN D, CHOLECALCIFEROL, PO Take 1 tablet by mouth daily.   No facility-administered encounter medications on file as of 03/07/2017.      Allergies:  Patient  has no known allergies.  Review of Systems: Gen:  Denies  fever, sweats. HEENT: Denies blurred vision. Cvc:  No dizziness, chest pain or heaviness Resp:   Denies cough or sputum porduction. Gi: Denies swallowing difficulty, stomach pain. constipation, bowel incontinence Gu:  Denies bladder incontinence, burning urine Ext:   No Joint pain, stiffness. Skin: No skin rash, easy bruising. Endoc:  No polyuria, polydipsia. Psych: No depression, insomnia. Other:  All other systems were reviewed and found to be negative other than what is mentioned in the HPI.   Physical Examination:   VS: BP 116/84 (BP Location: Left Arm, Cuff Size: Normal)   Pulse 85   Ht 5\' 9"   (1.753 m)   Wt 164 lb (74.4 kg)   SpO2 91%   BMI 24.22 kg/m   General Appearance: No distress  Neuro:without focal findings,  speech normal,  HEENT: PERRLA, EOM intact. Pulmonary: No wheezing.  Decreased air entry bilaterally.  CardiovascularNormal S1,S2.  No m/r/g.   Abdomen: Benign, Soft, non-tender. Renal:  No costovertebral tenderness  GU:  Not performed at this time. Endoc: No evident thyromegaly, no signs of acromegaly. Skin:   warm, no rash. Extremities: normal, no cyanosis, clubbing.   LABORATORY PANEL:   CBC No results for input(s): WBC, HGB, HCT, PLT in the last 168 hours. ------------------------------------------------------------------------------------------------------------------  Chemistries  No results for input(s): NA, K, CL, CO2, GLUCOSE, BUN, CREATININE, CALCIUM, MG, AST, ALT, ALKPHOS, BILITOT in the last 168 hours.  Invalid input(s): GFRCGP ------------------------------------------------------------------------------------------------------------------  Cardiac Enzymes No results for input(s): TROPONINI in the last 168 hours. ------------------------------------------------------------  RADIOLOGY:   No results found for this or any previous visit. Results for orders placed during the hospital encounter of 01/30/16  DG Chest 2 View   Narrative CLINICAL DATA:  Shortness of breath, worsening the past few months. COPD.  EXAM: CHEST  2 VIEW  COMPARISON:  None.  FINDINGS: Heart is normal size. There is hyperinflation of the lungs compatible with COPD. Areas of scarring in the mid lungs bilaterally. Previously seen multifocal consolidation has resolved. No effusions. No acute bony abnormality.  IMPRESSION: Interval resolution of the previously seen multifocal pneumonia. Areas of scarring in the mid lungs. COPD.   Electronically Signed   By: Rolm Baptise M.D.   On: 01/30/2016 13:16     ------------------------------------------------------------------------------------------------------------------  Thank  you for allowing [facility] to assist in the care of your patient. Our recommendations are noted above.  Please contact us if we can be of further service.   Marda Stalker, MD.  [place] [facility] Office Number: [phone]  Patricia Pesa, M.D.  Vilinda Boehringer, M.D.  Merton Border, M.D  03/07/2017

## 2017-03-10 DIAGNOSIS — J449 Chronic obstructive pulmonary disease, unspecified: Secondary | ICD-10-CM | POA: Diagnosis not present

## 2017-03-11 ENCOUNTER — Telehealth: Payer: Self-pay | Admitting: Internal Medicine

## 2017-03-11 NOTE — Telephone Encounter (Signed)
Pt spouse calling asking for the number for the home health company we used to send to their home.  She states patient took a message but he can't hear well and is asking for that number so she may call them back  Please advise

## 2017-03-11 NOTE — Telephone Encounter (Signed)
Pt spouse calling asking for the number

## 2017-03-11 NOTE — Telephone Encounter (Signed)
Attempted to return call. No answer nor answering machine.ss

## 2017-03-11 NOTE — Telephone Encounter (Signed)
Wife needed to confirm PFT appt. Ss 04/15/17

## 2017-03-15 ENCOUNTER — Ambulatory Visit: Payer: Medicare HMO | Attending: Internal Medicine

## 2017-03-15 DIAGNOSIS — J449 Chronic obstructive pulmonary disease, unspecified: Secondary | ICD-10-CM | POA: Diagnosis not present

## 2017-03-15 MED ORDER — ALBUTEROL SULFATE (2.5 MG/3ML) 0.083% IN NEBU
2.5000 mg | INHALATION_SOLUTION | Freq: Once | RESPIRATORY_TRACT | Status: AC
  Administered 2017-03-15: 2.5 mg via RESPIRATORY_TRACT

## 2017-03-21 DIAGNOSIS — H353221 Exudative age-related macular degeneration, left eye, with active choroidal neovascularization: Secondary | ICD-10-CM | POA: Diagnosis not present

## 2017-03-21 DIAGNOSIS — H353111 Nonexudative age-related macular degeneration, right eye, early dry stage: Secondary | ICD-10-CM | POA: Diagnosis not present

## 2017-03-24 ENCOUNTER — Encounter: Payer: Self-pay | Admitting: *Deleted

## 2017-03-24 ENCOUNTER — Telehealth: Payer: Self-pay | Admitting: Internal Medicine

## 2017-03-24 ENCOUNTER — Telehealth: Payer: Self-pay | Admitting: Family Medicine

## 2017-03-24 DIAGNOSIS — J449 Chronic obstructive pulmonary disease, unspecified: Secondary | ICD-10-CM

## 2017-03-24 NOTE — Telephone Encounter (Signed)
This encounter was created in error - please disregard.

## 2017-03-24 NOTE — Addendum Note (Signed)
Addended by: Leone Haven on: 03/24/2017 05:20 PM   Modules accepted: Orders

## 2017-03-24 NOTE — Telephone Encounter (Signed)
Returned call to [employer]. Pt has d/c nebs and restarted Anoro. This was patient decision and family has discussed this with him. They are trying to keep an eye on him. They have also request a new nebulizer machine from pcp. I've explained pulmonary rehab will be calling to schedule appt.

## 2017-03-24 NOTE — Telephone Encounter (Signed)
Douglas Ramsey pt daughter calling stating pt had test done a few weeks ago  They were told in last visit that we would get old tests that were done a few years ago and compare the two and give them a call about the findings   This previous test was done at Dr Retta Mac office.   Please call back

## 2017-03-24 NOTE — Telephone Encounter (Addendum)
Please print the DME order for the nebulizer.  I can sign it once you print it.  Thanks.

## 2017-03-24 NOTE — Telephone Encounter (Signed)
Daughter Westley Hummer called to request a new nebulizer for  Mr. Douglas Ramsey. States the one that he is using is over 81 years old and working properly.  She also is requesting a respiratory therapist to assist her dad with his breathing because he gets so out of breathe quickly. Also to let the doctor know that he is back on his Anoro.

## 2017-03-24 NOTE — Telephone Encounter (Signed)
Please advise 

## 2017-03-25 NOTE — Telephone Encounter (Signed)
Daughter informed & order faxed to [facility]

## 2017-03-28 DIAGNOSIS — I493 Ventricular premature depolarization: Secondary | ICD-10-CM | POA: Diagnosis not present

## 2017-03-28 DIAGNOSIS — I2510 Atherosclerotic heart disease of native coronary artery without angina pectoris: Secondary | ICD-10-CM | POA: Diagnosis not present

## 2017-03-28 DIAGNOSIS — E782 Mixed hyperlipidemia: Secondary | ICD-10-CM | POA: Diagnosis not present

## 2017-03-30 ENCOUNTER — Telehealth: Payer: Self-pay | Admitting: Family Medicine

## 2017-03-30 NOTE — Telephone Encounter (Signed)
Please call beth about nebulizer machine   Patient isnt able to do nebs because the tube keeps coming out of machine

## 2017-03-30 NOTE — Telephone Encounter (Signed)
Spoke with daughter who states that Dr. Caryl Bis has called stating that they have a new neb machine that can be picked up for the patient. She states the problem may be the patient has an old machine. Informed daughter to make sure the tubing is pushed on tightly on both ends of the tubing. She verbalized understanding and stated she would also inform her mom. Nothing further needed.

## 2017-03-30 NOTE — Telephone Encounter (Unsigned)
Copied from [place] #[id number]. Topic: Quick Communication - See Telephone Encounter >> Mar 30, 2017  2:50 PM Hewitt Shorts wrote: CRM for notification. See Telephone encounter for: 03/30/17.  Pt care give called today state that the nebulizer that was supposed to be sent to [place] on 03/24/17 they state that they do not have it that they dont even have it

## 2017-03-30 NOTE — Telephone Encounter (Signed)
Spoke with patients daughter and patient. They are aware that machine is up front with paper work that has to be signed in order to pick up machine. Will fax once patient has signed.

## 2017-04-01 NOTE — Telephone Encounter (Signed)
Pt called and said he will take care of this when he comes in 12/10

## 2017-04-04 ENCOUNTER — Ambulatory Visit: Payer: Medicare HMO | Admitting: Family Medicine

## 2017-04-04 ENCOUNTER — Telehealth: Payer: Self-pay | Admitting: Family Medicine

## 2017-04-04 NOTE — Telephone Encounter (Signed)
Pt came in and picked up his nebulizer and also signed paperwork. The paperwork is in Dr Caryl Bis red folder up front. Thank you!

## 2017-04-05 NOTE — Telephone Encounter (Signed)
noted 

## 2017-04-10 DIAGNOSIS — J449 Chronic obstructive pulmonary disease, unspecified: Secondary | ICD-10-CM | POA: Diagnosis not present

## 2017-04-18 ENCOUNTER — Ambulatory Visit: Payer: Medicare HMO | Admitting: Family Medicine

## 2017-04-22 ENCOUNTER — Ambulatory Visit: Payer: Medicare HMO | Admitting: Family Medicine

## 2017-04-25 ENCOUNTER — Ambulatory Visit: Payer: Medicare HMO | Admitting: Family Medicine

## 2017-04-29 ENCOUNTER — Telehealth: Payer: Self-pay | Admitting: *Deleted

## 2017-04-29 MED ORDER — NONFORMULARY OR COMPOUNDED ITEM: 0 refills | Status: DC

## 2017-04-29 NOTE — Telephone Encounter (Signed)
Refill rrequest for alternative pain cream from [facility]. Dr.Evans states refill once pt needs an appt prior to future refills. Return fax with instructions and refill.

## 2017-05-02 DIAGNOSIS — H353221 Exudative age-related macular degeneration, left eye, with active choroidal neovascularization: Secondary | ICD-10-CM | POA: Diagnosis not present

## 2017-05-06 ENCOUNTER — Ambulatory Visit: Payer: Self-pay | Admitting: *Deleted

## 2017-05-06 DIAGNOSIS — R079 Chest pain, unspecified: Secondary | ICD-10-CM | POA: Diagnosis not present

## 2017-05-06 DIAGNOSIS — I25118 Atherosclerotic heart disease of native coronary artery with other forms of angina pectoris: Secondary | ICD-10-CM | POA: Diagnosis not present

## 2017-05-06 DIAGNOSIS — E782 Mixed hyperlipidemia: Secondary | ICD-10-CM | POA: Diagnosis not present

## 2017-05-06 NOTE — Telephone Encounter (Signed)
Patient has not arrived to ED. Called patient to follow-up and left message for patient to return call and go to ED if still experiencing same symptoms.

## 2017-05-06 NOTE — Telephone Encounter (Signed)
Wife called in for husband c/o "pain around his heart"   He had a stent put in 6 yrs ago.   He took 3 NGT tablets which helped the "pain around his heart".   He also has bad COPD per the wife so he is always "short of breath".   Husband was in the background answering the questions.  I instructed them to call 911 and go to the ED.   The wife stated they only  Live 2 miles from the hospital and she will take him.   She didn't want to call the ambulance.   I instructed her to take him now to [facility] .   She assured me she would take him now.   Reason for Disposition . [1] Chest pain lasts > 5 minutes AND [2] history of heart disease  (i.e., heart attack, bypass surgery, angina, angioplasty, CHF; not just a heart murmur)  Answer Assessment - Initial Assessment Questions 1. LOCATION: "Where does it hurt?"       Pain around his heart.  No radiation of pain 2. RADIATION: "Does the pain go anywhere else?" (e.g., into neck, jaw, arms, back)     No per wife who is talking with him in the background. 3. ONSET: "When did the chest pain begin?" (Minutes, hours or days)      Night before last. 4. PATTERN "Does the pain come and go, or has it been constant since it started?"  "Does it get worse with exertion?"      I took NTG tablet after the 3 times. 5. DURATION: "How long does it last" (e.g., seconds, minutes, hours)     10 seconds 6. SEVERITY: "How bad is the pain?"  (e.g., Scale 1-10; mild, moderate, or severe)    - MILD (1-3): doesn't interfere with normal activities     - MODERATE (4-7): interferes with normal activities or awakens from sleep    - SEVERE (8-10): excruciating pain, unable to do any normal activities       He has COPD so he always has trouble breathing. 7. CARDIAC RISK FACTORS: "Do you have any history of heart problems or risk factors for heart disease?" (e.g., prior heart attack, angina; high blood pressure, diabetes, being overweight, high cholesterol,  smoking, or strong family history of heart disease)     Had a stent put in 6 yrs ago.  He did have a heart attack. 8. PULMONARY RISK FACTORS: "Do you have any history of lung disease?"  (e.g., blood clots in lung, asthma, emphysema, birth control pills)     COPD.   No blood clots anywhere. 9. CAUSE: "What do you think is causing the chest pain?"     It's coming from his heart. 10. OTHER SYMPTOMS: "Do you have any other symptoms?" (e.g., dizziness, nausea, vomiting, sweating, fever, difficulty breathing, cough)       No dizziness, or nausea.  Not feeling sweaty. 11. PREGNANCY: "Is there any chance you are pregnant?" "When was your last menstrual period?"       N/A  Protocols used: CHEST PAIN-A-AH

## 2017-05-10 ENCOUNTER — Other Ambulatory Visit: Payer: Self-pay

## 2017-05-10 ENCOUNTER — Emergency Department: Payer: Medicare HMO

## 2017-05-10 ENCOUNTER — Inpatient Hospital Stay
Admission: EM | Admit: 2017-05-10 | Discharge: 2017-05-13 | DRG: 388 | Disposition: A | Discharge status: Hospice | Payer: Medicare HMO | Attending: Internal Medicine | Admitting: Internal Medicine

## 2017-05-10 DIAGNOSIS — I714 Abdominal aortic aneurysm, without rupture: Secondary | ICD-10-CM | POA: Diagnosis present

## 2017-05-10 DIAGNOSIS — I2510 Atherosclerotic heart disease of native coronary artery without angina pectoris: Secondary | ICD-10-CM | POA: Diagnosis not present

## 2017-05-10 DIAGNOSIS — K5669 Other partial intestinal obstruction: Secondary | ICD-10-CM | POA: Diagnosis not present

## 2017-05-10 DIAGNOSIS — R140 Abdominal distension (gaseous): Secondary | ICD-10-CM | POA: Diagnosis not present

## 2017-05-10 DIAGNOSIS — E785 Hyperlipidemia, unspecified: Secondary | ICD-10-CM | POA: Diagnosis not present

## 2017-05-10 DIAGNOSIS — Z7982 Long term (current) use of aspirin: Secondary | ICD-10-CM | POA: Diagnosis not present

## 2017-05-10 DIAGNOSIS — D72829 Elevated white blood cell count, unspecified: Secondary | ICD-10-CM | POA: Diagnosis present

## 2017-05-10 DIAGNOSIS — K567 Ileus, unspecified: Secondary | ICD-10-CM

## 2017-05-10 DIAGNOSIS — R1084 Generalized abdominal pain: Secondary | ICD-10-CM

## 2017-05-10 DIAGNOSIS — Z66 Do not resuscitate: Secondary | ICD-10-CM | POA: Diagnosis present

## 2017-05-10 DIAGNOSIS — N189 Chronic kidney disease, unspecified: Secondary | ICD-10-CM | POA: Diagnosis present

## 2017-05-10 DIAGNOSIS — Z79899 Other long term (current) drug therapy: Secondary | ICD-10-CM | POA: Diagnosis not present

## 2017-05-10 DIAGNOSIS — C7801 Secondary malignant neoplasm of right lung: Secondary | ICD-10-CM | POA: Diagnosis not present

## 2017-05-10 DIAGNOSIS — Z931 Gastrostomy status: Secondary | ICD-10-CM

## 2017-05-10 DIAGNOSIS — Z515 Encounter for palliative care: Secondary | ICD-10-CM | POA: Diagnosis not present

## 2017-05-10 DIAGNOSIS — C799 Secondary malignant neoplasm of unspecified site: Secondary | ICD-10-CM | POA: Diagnosis not present

## 2017-05-10 DIAGNOSIS — Z4682 Encounter for fitting and adjustment of non-vascular catheter: Secondary | ICD-10-CM | POA: Diagnosis not present

## 2017-05-10 DIAGNOSIS — Z955 Presence of coronary angioplasty implant and graft: Secondary | ICD-10-CM | POA: Diagnosis not present

## 2017-05-10 DIAGNOSIS — E782 Mixed hyperlipidemia: Secondary | ICD-10-CM | POA: Diagnosis present

## 2017-05-10 DIAGNOSIS — K439 Ventral hernia without obstruction or gangrene: Secondary | ICD-10-CM | POA: Diagnosis present

## 2017-05-10 DIAGNOSIS — K56609 Unspecified intestinal obstruction, unspecified as to partial versus complete obstruction: Secondary | ICD-10-CM | POA: Diagnosis present

## 2017-05-10 DIAGNOSIS — E43 Unspecified severe protein-calorie malnutrition: Secondary | ICD-10-CM | POA: Diagnosis not present

## 2017-05-10 DIAGNOSIS — I252 Old myocardial infarction: Secondary | ICD-10-CM

## 2017-05-10 DIAGNOSIS — Z9981 Dependence on supplemental oxygen: Secondary | ICD-10-CM

## 2017-05-10 DIAGNOSIS — E46 Unspecified protein-calorie malnutrition: Secondary | ICD-10-CM | POA: Diagnosis not present

## 2017-05-10 DIAGNOSIS — C189 Malignant neoplasm of colon, unspecified: Secondary | ICD-10-CM | POA: Diagnosis present

## 2017-05-10 DIAGNOSIS — Z6822 Body mass index (BMI) 22.0-22.9, adult: Secondary | ICD-10-CM | POA: Diagnosis not present

## 2017-05-10 DIAGNOSIS — Z9189 Other specified personal risk factors, not elsewhere classified: Secondary | ICD-10-CM | POA: Diagnosis not present

## 2017-05-10 DIAGNOSIS — C7802 Secondary malignant neoplasm of left lung: Secondary | ICD-10-CM | POA: Diagnosis not present

## 2017-05-10 DIAGNOSIS — J449 Chronic obstructive pulmonary disease, unspecified: Secondary | ICD-10-CM | POA: Diagnosis not present

## 2017-05-10 DIAGNOSIS — Z7189 Other specified counseling: Secondary | ICD-10-CM

## 2017-05-10 DIAGNOSIS — R109 Unspecified abdominal pain: Secondary | ICD-10-CM | POA: Diagnosis not present

## 2017-05-10 DIAGNOSIS — K219 Gastro-esophageal reflux disease without esophagitis: Secondary | ICD-10-CM | POA: Diagnosis not present

## 2017-05-10 LAB — COMPREHENSIVE METABOLIC PANEL
ALK PHOS: 75 U/L (ref 38–126)
ALT: 13 U/L — AB (ref 17–63)
ANION GAP: 10 (ref 5–15)
AST: 34 U/L (ref 15–41)
Albumin: 3.7 g/dL (ref 3.5–5.0)
BILIRUBIN TOTAL: 0.9 mg/dL (ref 0.3–1.2)
BUN: 16 mg/dL (ref 6–20)
CALCIUM: 9.2 mg/dL (ref 8.9–10.3)
CO2: 27 mmol/L (ref 22–32)
CREATININE: 1.16 mg/dL (ref 0.61–1.24)
Chloride: 100 mmol/L — ABNORMAL LOW (ref 101–111)
GFR calc non Af Amer: 51 mL/min — ABNORMAL LOW (ref >60)
GFR, EST AFRICAN AMERICAN: 59 mL/min — AB (ref >60)
GLUCOSE: 121 mg/dL — AB (ref 65–99)
Potassium: 3.9 mmol/L (ref 3.5–5.1)
SODIUM: 137 mmol/L (ref 135–145)
TOTAL PROTEIN: 7.3 g/dL (ref 6.5–8.1)

## 2017-05-10 LAB — CBC
HEMATOCRIT: 46.3 % (ref 40.0–52.0)
HEMOGLOBIN: 14.9 g/dL (ref 13.0–18.0)
MCH: 27.9 pg (ref 26.0–34.0)
MCHC: 32.2 g/dL (ref 32.0–36.0)
MCV: 86.7 fL (ref 80.0–100.0)
Platelets: 248 10*3/uL (ref 150–440)
RBC: 5.34 MIL/uL (ref 4.40–5.90)
RDW: 16.4 % — ABNORMAL HIGH (ref 11.5–14.5)
WBC: 14.8 10*3/uL — ABNORMAL HIGH (ref 3.8–10.6)

## 2017-05-10 LAB — LIPASE, BLOOD: Lipase: 31 U/L (ref 11–51)

## 2017-05-10 LAB — APTT: APTT: 32 s (ref 24–36)

## 2017-05-10 LAB — PROTIME-INR
INR: 0.93
Prothrombin Time: 12.4 seconds (ref 11.4–15.2)

## 2017-05-10 MED ORDER — MORPHINE SULFATE (PF) 4 MG/ML IV SOLN
INTRAVENOUS | Status: AC
  Administered 2017-05-10: 4 mg via INTRAVENOUS
  Filled 2017-05-10: qty 1

## 2017-05-10 MED ORDER — ONDANSETRON HCL 4 MG/2ML IJ SOLN
INTRAMUSCULAR | Status: AC
  Administered 2017-05-10: 4 mg via INTRAVENOUS
  Filled 2017-05-10: qty 2

## 2017-05-10 MED ORDER — MORPHINE SULFATE (PF) 2 MG/ML IV SOLN
2.0000 mg | Freq: Once | INTRAVENOUS | Status: AC
  Administered 2017-05-10: 2 mg via INTRAVENOUS

## 2017-05-10 MED ORDER — MORPHINE SULFATE (PF) 4 MG/ML IV SOLN
4.0000 mg | Freq: Once | INTRAVENOUS | Status: AC
  Administered 2017-05-10: 4 mg via INTRAVENOUS

## 2017-05-10 MED ORDER — ONDANSETRON HCL 4 MG/2ML IJ SOLN
4.0000 mg | Freq: Once | INTRAMUSCULAR | Status: AC
  Administered 2017-05-10: 4 mg via INTRAVENOUS

## 2017-05-10 MED ORDER — HYDROMORPHONE HCL 1 MG/ML IJ SOLN
1.0000 mg | Freq: Once | INTRAMUSCULAR | Status: DC
Start: 2017-05-10 — End: 2017-05-10
  Filled 2017-05-10: qty 1

## 2017-05-10 MED ORDER — IOPAMIDOL (ISOVUE-370) INJECTION 76%
75.0000 mL | Freq: Once | INTRAVENOUS | Status: AC | PRN
  Administered 2017-05-10: 75 mL via INTRAVENOUS

## 2017-05-10 MED ORDER — MORPHINE SULFATE (PF) 2 MG/ML IV SOLN
INTRAVENOUS | Status: AC
Start: 2017-05-10 — End: 2017-05-10
  Administered 2017-05-10: 2 mg via INTRAVENOUS
  Filled 2017-05-10: qty 1

## 2017-05-10 MED ORDER — HYDROMORPHONE HCL 1 MG/ML IJ SOLN
0.5000 mg | Freq: Once | INTRAMUSCULAR | Status: AC
  Administered 2017-05-10: 0.5 mg via INTRAVENOUS

## 2017-05-10 NOTE — Consult Note (Signed)
Patient ID: Douglas Ramsey, male   DOB: 1918-10-06, 83 y.o.   MRN: [id number]  HPI Douglas Ramsey is a 83 y.o. male asked to see in consultation by Dr. Corky Downs. He had significant history of a previous right hemicolectomy and a low anterior resection w loop ileostomy in 2016 by Dr. Rexene Edison for colon cancer. The patient had an ileostomy again and consequently and develop an MI A. fib and RVR. He had a prolonged hospitalization at that time. Because of his advanced age and the patient did not wish to have aggressive adjuvant chemotherapy. He presented today with abdominal pain nausea and vomiting. He reports that his pain is severe, colicky and intermittent. Pain is diffuse and there is no specific alleviating or aggravating factors. Comorbidities include coronary artery disease status post stent, chronic kidney disease, COPD and recent unstable angina. He has been seen by his cardiologist over the last few days and further workup included an echo has been ordered. His acute cauterized issues have not resolved yet. He still is able to make his own decisions and does most of his ADLs. He does have dyspnea on exertion and Unstable angina recently being w/u by cardiology. Are the workup including a CT scan of the abdomen and chest and pelvis. I have personally reviewed status there is obvious severe metastatic disease to both lungs and also mesentery. There is some dilation of the small bowel. There is no definitive transition area. I do see the staple line the lingula colic anastomosis and the small bowel resection. There is no free air . There is a 7.1 cm AAA non ruptured. WBC 14, creat 1.16. CO2 27.     HPI  Past Medical History:  Diagnosis Date  . Anemia   . CAD (coronary artery disease)    s/p MI and PCI  . CKD (chronic kidney disease)   . Colon cancer ([place])   . COPD (chronic obstructive pulmonary disease) ([place])   . Hyperlipidemia   . Rhinitis     Past Surgical History:  Procedure  Laterality Date  . COLON SURGERY     ostomy (reversed in december)  . COLONOSCOPY  05/13/2004  . CORONARY ANGIOPLASTY  02/2007  . ESOPHAGOGASTRODUODENOSCOPY  05/11/2014  . FLEXIBLE SIGMOIDOSCOPY  05/14/2014    Family History  Problem Relation Age of Onset  . Heart disease Father   . Heart disease Brother     Social History Social History   Tobacco Use  . Smoking status: Never Smoker  . Smokeless tobacco: Never Used  Substance Use Topics  . Alcohol use: No  . Drug use: No    No Known Allergies  No current facility-administered medications for this encounter.    Current Outpatient Medications  Medication Sig Dispense Refill  . ascorbic acid (VITAMIN C) 100 MG tablet Take 100 mg by mouth daily.    Marland Kitchen aspirin 81 MG tablet Take 81 mg by mouth daily.    . Calcium Carbonate (CALCIUM 600 PO) Take 1 tablet by mouth daily.    . Cyanocobalamin (VITAMIN B 12 PO) Take 1 tablet by mouth daily.    . ferrous sulfate 325 (65 FE) MG tablet Take 325 mg by mouth daily.    Marland Kitchen glucosamine-chondroitin 500-400 MG tablet Take 1 tablet by mouth 3 (three) times daily.    Marland Kitchen ipratropium-albuterol (DUONEB) 0.5-2.5 (3) MG/3ML SOLN Take 3 mLs by nebulization 3 (three) times daily. 360 mL 5  . isosorbide mononitrate (IMDUR) 30 MG 24 hr tablet Take 30  mg by mouth daily.    . nitroGLYCERIN (NITROSTAT) 0.4 MG SL tablet Place 1 tablet (0.4 mg total) under the tongue every 5 (five) minutes as needed for chest pain. 50 tablet 3  . Potassium 95 MG TABS Take 1 tablet by mouth daily.    . simvastatin (ZOCOR) 20 MG tablet Take 1 tablet (20 mg total) by mouth daily. 90 tablet 3  . Specialty Vitamins Products (MAGNESIUM, AMINO ACID CHELATE,) 133 MG tablet Take 1 tablet by mouth 2 (two) times daily.    Marland Kitchen umeclidinium-vilanterol (ANORO ELLIPTA) 62.5-25 MCG/INH AEPB Inhale 1 puff into the lungs daily.    . VENTOLIN HFA 108 (90 Base) MCG/ACT inhaler Inhale 1-2 puffs into the lungs every 4 (four) hours as needed. 18 g 3   . VITAMIN D, CHOLECALCIFEROL, PO Take 1 tablet by mouth daily.    Marland Kitchen Aluminum & Magnesium Hydroxide (MAGNESIUM-ALUMINUM PO) Take 1 tablet by mouth daily.    . NONFORMULARY OR COMPOUNDED ITEM [facility]:  Authorized Substitute Pain Cream - Ibuprofen 15%, Baclofen 1%, Gabapentin 3%, Lidocaine 2% apply 1-2 grams to affected area 3-4 times daily. 120 each 0     Review of Systems Full ROS  was asked and was negative except for the information on the HPI  Physical Exam Blood pressure 127/81, pulse 85, temperature 97.7 F (36.5 C), temperature source Oral, resp. rate 18, height 5\' 11"  (1.803 m), weight 68 kg (150 lb), SpO2 91 %. CONSTITUTIONAL: malnourished c/o abdominal pain EYES: Pupils are equal, round, and reactive to light, Sclera are non-icteric. EARS, NOSE, MOUTH AND THROAT: The oropharynx is clear. The oral mucosa is pink and moist. Hearing is intact to voice. LYMPH NODES:  Lymph nodes in the neck are normal. RESPIRATORY:  Lungs decreased bilaterally  without pathologic use of accessory muscles. CARDIOVASCULAR: Heart is regular without murmurs, gallops, or rubs. GI: The abdomen is  Soft, Reducible incisional hernia and right inguinal hernia. No peritonitis. There is diffuse tenderness to palpation. There is also abd distension and decrease BS. GU: Rectal deferred.   MUSCULOSKELETAL: Normal muscle strength and tone. No cyanosis or edema.   SKIN: Turgor is good and there are no pathologic skin lesions or ulcers. NEUROLOGIC: Motor and sensation is grossly normal. Cranial nerves are grossly intact. PSYCH:  Oriented to person, place and time. Affect is normal. He is competent  Data Reviewed  I have personally reviewed the patient's imaging, laboratory findings and medical records.    Assessment/Plan 83 year old male multiple comorbidities including coronary artery disease, malnutrition, COPD, history of MI following surgery, unstable angina now presents with partial small bowel  obstruction not related to any of the hernias. More importantly newly discovered wildly metastatic disease to both lungs and mesentery from his previous colon cancer. I had a lengthy discussion about his poor prognosis with the family and with the patient. The patient and the family are adamant that date did not wish any surgical intervention. Currently I do not think that any surgical intervention is needed and actually will do more harm than good. He is a very poor surgical candidate given all his comorbidities and his metastatic disease. They have chosen to do NG tube decompression, aggressive pain control with narcotics and palliative care consult. We'll be happy to follow along with you. Once again the patient is a very poor surgical candidate and I think that any surgical intervention will harm him even more and exposing to unnecessary morbidity and mortality. I Have discussed with Dr. Corky Downs and  his family in detail about the decision of Do not to resuscitate or to do any major surgeries at this time because of poor prognosis.  Caroleen Hamman, MD FACS General Surgeon 05/10/2017, 11:13 PM

## 2017-05-10 NOTE — ED Notes (Signed)
ED Provider at bedside. 

## 2017-05-10 NOTE — ED Triage Notes (Signed)
Pt to triage via w/c, appears uncomfortable, moaning; c/o umbilical pain since this afternoon; hernia present; large abd scar noted which pt reports he had colostomy 29yrs ago, but pt unable to report why--family reports due to colon CA; pt denies any accomp symptoms

## 2017-05-10 NOTE — ED Notes (Signed)
Patient transported to CT 

## 2017-05-11 ENCOUNTER — Encounter: Payer: Self-pay | Admitting: *Deleted

## 2017-05-11 ENCOUNTER — Other Ambulatory Visit: Payer: Self-pay

## 2017-05-11 DIAGNOSIS — C189 Malignant neoplasm of colon, unspecified: Secondary | ICD-10-CM

## 2017-05-11 DIAGNOSIS — E785 Hyperlipidemia, unspecified: Secondary | ICD-10-CM | POA: Diagnosis not present

## 2017-05-11 DIAGNOSIS — Z66 Do not resuscitate: Secondary | ICD-10-CM | POA: Diagnosis present

## 2017-05-11 DIAGNOSIS — D72829 Elevated white blood cell count, unspecified: Secondary | ICD-10-CM | POA: Diagnosis present

## 2017-05-11 DIAGNOSIS — Z7189 Other specified counseling: Secondary | ICD-10-CM | POA: Diagnosis not present

## 2017-05-11 DIAGNOSIS — K219 Gastro-esophageal reflux disease without esophagitis: Secondary | ICD-10-CM

## 2017-05-11 DIAGNOSIS — R140 Abdominal distension (gaseous): Secondary | ICD-10-CM

## 2017-05-11 DIAGNOSIS — E782 Mixed hyperlipidemia: Secondary | ICD-10-CM | POA: Diagnosis present

## 2017-05-11 DIAGNOSIS — C799 Secondary malignant neoplasm of unspecified site: Secondary | ICD-10-CM

## 2017-05-11 DIAGNOSIS — N189 Chronic kidney disease, unspecified: Secondary | ICD-10-CM

## 2017-05-11 DIAGNOSIS — K56609 Unspecified intestinal obstruction, unspecified as to partial versus complete obstruction: Secondary | ICD-10-CM | POA: Diagnosis not present

## 2017-05-11 DIAGNOSIS — K439 Ventral hernia without obstruction or gangrene: Secondary | ICD-10-CM | POA: Diagnosis present

## 2017-05-11 DIAGNOSIS — I714 Abdominal aortic aneurysm, without rupture: Secondary | ICD-10-CM | POA: Diagnosis present

## 2017-05-11 DIAGNOSIS — K5669 Other partial intestinal obstruction: Secondary | ICD-10-CM | POA: Diagnosis present

## 2017-05-11 DIAGNOSIS — C7801 Secondary malignant neoplasm of right lung: Secondary | ICD-10-CM | POA: Diagnosis present

## 2017-05-11 DIAGNOSIS — Z955 Presence of coronary angioplasty implant and graft: Secondary | ICD-10-CM | POA: Diagnosis not present

## 2017-05-11 DIAGNOSIS — Z7982 Long term (current) use of aspirin: Secondary | ICD-10-CM | POA: Diagnosis not present

## 2017-05-11 DIAGNOSIS — R109 Unspecified abdominal pain: Secondary | ICD-10-CM

## 2017-05-11 DIAGNOSIS — Z9189 Other specified personal risk factors, not elsewhere classified: Secondary | ICD-10-CM | POA: Diagnosis not present

## 2017-05-11 DIAGNOSIS — J449 Chronic obstructive pulmonary disease, unspecified: Secondary | ICD-10-CM | POA: Diagnosis present

## 2017-05-11 DIAGNOSIS — I252 Old myocardial infarction: Secondary | ICD-10-CM

## 2017-05-11 DIAGNOSIS — Z515 Encounter for palliative care: Secondary | ICD-10-CM

## 2017-05-11 DIAGNOSIS — Z79899 Other long term (current) drug therapy: Secondary | ICD-10-CM

## 2017-05-11 DIAGNOSIS — E43 Unspecified severe protein-calorie malnutrition: Secondary | ICD-10-CM | POA: Diagnosis present

## 2017-05-11 DIAGNOSIS — Z6822 Body mass index (BMI) 22.0-22.9, adult: Secondary | ICD-10-CM | POA: Diagnosis not present

## 2017-05-11 DIAGNOSIS — Z931 Gastrostomy status: Secondary | ICD-10-CM

## 2017-05-11 DIAGNOSIS — I2510 Atherosclerotic heart disease of native coronary artery without angina pectoris: Secondary | ICD-10-CM

## 2017-05-11 DIAGNOSIS — R1084 Generalized abdominal pain: Secondary | ICD-10-CM | POA: Diagnosis present

## 2017-05-11 DIAGNOSIS — C7802 Secondary malignant neoplasm of left lung: Secondary | ICD-10-CM | POA: Diagnosis present

## 2017-05-11 DIAGNOSIS — Z9981 Dependence on supplemental oxygen: Secondary | ICD-10-CM | POA: Diagnosis not present

## 2017-05-11 LAB — BASIC METABOLIC PANEL
ANION GAP: 7 (ref 5–15)
BUN: 17 mg/dL (ref 6–20)
CALCIUM: 9 mg/dL (ref 8.9–10.3)
CO2: 29 mmol/L (ref 22–32)
Chloride: 101 mmol/L (ref 101–111)
Creatinine, Ser: 1.17 mg/dL (ref 0.61–1.24)
GFR calc Af Amer: 59 mL/min — ABNORMAL LOW (ref >60)
GFR, EST NON AFRICAN AMERICAN: 51 mL/min — AB (ref >60)
GLUCOSE: 152 mg/dL — AB (ref 65–99)
POTASSIUM: 4.5 mmol/L (ref 3.5–5.1)
SODIUM: 137 mmol/L (ref 135–145)

## 2017-05-11 LAB — CBC
HEMATOCRIT: 46.5 % (ref 40.0–52.0)
HEMOGLOBIN: 15.1 g/dL (ref 13.0–18.0)
MCH: 27.8 pg (ref 26.0–34.0)
MCHC: 32.4 g/dL (ref 32.0–36.0)
MCV: 85.7 fL (ref 80.0–100.0)
Platelets: 234 10*3/uL (ref 150–440)
RBC: 5.42 MIL/uL (ref 4.40–5.90)
RDW: 15.8 % — AB (ref 11.5–14.5)
WBC: 23.6 10*3/uL — AB (ref 3.8–10.6)

## 2017-05-11 LAB — GLUCOSE, CAPILLARY: GLUCOSE-CAPILLARY: 117 mg/dL — AB (ref 65–99)

## 2017-05-11 MED ORDER — PHENOL 1.4 % MT LIQD
1.0000 | OROMUCOSAL | Status: DC | PRN
  Administered 2017-05-11: 1 via OROMUCOSAL
  Filled 2017-05-11: qty 177

## 2017-05-11 MED ORDER — HYDROCODONE-ACETAMINOPHEN 5-325 MG PO TABS: 1.0000 | ORAL_TABLET | ORAL | Status: DC | PRN

## 2017-05-11 MED ORDER — UMECLIDINIUM-VILANTEROL 62.5-25 MCG/INH IN AEPB
1.0000 | INHALATION_SPRAY | Freq: Every day | RESPIRATORY_TRACT | Status: DC
  Administered 2017-05-11 – 2017-05-13 (×3): 1 via RESPIRATORY_TRACT
  Filled 2017-05-11: qty 14

## 2017-05-11 MED ORDER — TRAZODONE HCL 50 MG PO TABS: 25.0000 mg | ORAL_TABLET | Freq: Every evening | ORAL | Status: DC | PRN

## 2017-05-11 MED ORDER — SIMVASTATIN 20 MG PO TABS
20.0000 mg | ORAL_TABLET | Freq: Every day | ORAL | Status: DC
  Filled 2017-05-11: qty 1

## 2017-05-11 MED ORDER — NITROGLYCERIN 0.4 MG SL SUBL: 0.4000 mg | SUBLINGUAL_TABLET | SUBLINGUAL | Status: DC | PRN

## 2017-05-11 MED ORDER — DOCUSATE SODIUM 100 MG PO CAPS: 100.0000 mg | ORAL_CAPSULE | Freq: Two times a day (BID) | ORAL | Status: DC

## 2017-05-11 MED ORDER — ASPIRIN 81 MG PO CHEW: 81.0000 mg | CHEWABLE_TABLET | Freq: Every day | ORAL | Status: DC

## 2017-05-11 MED ORDER — MORPHINE SULFATE (PF) 2 MG/ML IV SOLN
1.0000 mg | INTRAVENOUS | Status: DC | PRN
  Administered 2017-05-11 (×3): 1 mg via INTRAVENOUS
  Filled 2017-05-11 (×3): qty 1

## 2017-05-11 MED ORDER — ASCORBIC ACID 100 MG PO TABS: 100.0000 mg | ORAL_TABLET | Freq: Every day | ORAL | Status: DC

## 2017-05-11 MED ORDER — VITAMIN C 500 MG PO TABS
500.0000 mg | ORAL_TABLET | Freq: Every day | ORAL | Status: DC
  Filled 2017-05-11 (×2): qty 1

## 2017-05-11 MED ORDER — GLUCOSAMINE-CHONDROITIN 500-400 MG PO TABS: 1.0000 | ORAL_TABLET | Freq: Three times a day (TID) | ORAL | Status: DC

## 2017-05-11 MED ORDER — ONDANSETRON HCL 4 MG PO TABS
4.0000 mg | ORAL_TABLET | Freq: Four times a day (QID) | ORAL | Status: DC | PRN
Start: 2017-05-11 — End: 2017-05-12

## 2017-05-11 MED ORDER — HEPARIN SODIUM (PORCINE) 5000 UNIT/ML IJ SOLN
5000.0000 [IU] | Freq: Three times a day (TID) | INTRAMUSCULAR | Status: DC
  Administered 2017-05-11 – 2017-05-12 (×4): 5000 [IU] via SUBCUTANEOUS
  Filled 2017-05-11 (×4): qty 1

## 2017-05-11 MED ORDER — MORPHINE SULFATE (CONCENTRATE) 10 MG/0.5ML PO SOLN
5.0000 mg | ORAL | Status: DC | PRN
  Administered 2017-05-12: 5 mg via ORAL
  Filled 2017-05-11: qty 1

## 2017-05-11 MED ORDER — ONDANSETRON HCL 4 MG/2ML IJ SOLN: 4.0000 mg | Freq: Four times a day (QID) | INTRAMUSCULAR | Status: DC | PRN

## 2017-05-11 MED ORDER — BISACODYL 5 MG PO TBEC: 5.0000 mg | DELAYED_RELEASE_TABLET | Freq: Every day | ORAL | Status: DC | PRN

## 2017-05-11 MED ORDER — MG-PLUS PROTEIN 133 MG PO TABS: 1.0000 | ORAL_TABLET | Freq: Two times a day (BID) | ORAL | Status: DC

## 2017-05-11 MED ORDER — ACETAMINOPHEN 325 MG PO TABS: 650.0000 mg | ORAL_TABLET | Freq: Four times a day (QID) | ORAL | Status: DC | PRN

## 2017-05-11 MED ORDER — SODIUM CHLORIDE 0.9 % IV SOLN
INTRAVENOUS | Status: DC
  Administered 2017-05-11 – 2017-05-12 (×3): via INTRAVENOUS

## 2017-05-11 MED ORDER — FERROUS SULFATE 325 (65 FE) MG PO TABS: 325.0000 mg | ORAL_TABLET | Freq: Every day | ORAL | Status: DC

## 2017-05-11 MED ORDER — IPRATROPIUM-ALBUTEROL 0.5-2.5 (3) MG/3ML IN SOLN
3.0000 mL | Freq: Three times a day (TID) | RESPIRATORY_TRACT | Status: DC
  Administered 2017-05-11 – 2017-05-13 (×7): 3 mL via RESPIRATORY_TRACT
  Filled 2017-05-11 (×7): qty 3

## 2017-05-11 MED ORDER — ACETAMINOPHEN 650 MG RE SUPP: 650.0000 mg | Freq: Four times a day (QID) | RECTAL | Status: DC | PRN

## 2017-05-11 MED ORDER — ISOSORBIDE MONONITRATE ER 30 MG PO TB24: 30.0000 mg | ORAL_TABLET | Freq: Every day | ORAL | Status: DC

## 2017-05-11 MED ORDER — POTASSIUM 95 MG PO TABS: 1.0000 | ORAL_TABLET | Freq: Every day | ORAL | Status: DC

## 2017-05-11 NOTE — Progress Notes (Signed)
[facility] [place] NAME: Douglas Ramsey    MR#:  [id number]  DATE OF BIRTH:  08-27-1918  SUBJECTIVE: 83 year old male with history of colon cancer 3 years ago came in for abdominal pain, nausea, vomiting and found to have small bowel obstruction due to recurrent colon cancer, metastases to the lungs.  Now has NG tube to LIS, having significant amount of bilious drainage from NG tube.  And he feels better, less abdominal pain.  CHIEF COMPLAINT:   Chief Complaint  Patient presents with  . Abdominal Pain    REVIEW OF SYSTEMS:   Review of Systems  Constitutional: Negative for chills and fever.  HENT: Positive for hearing loss.   Eyes: Negative for blurred vision, double vision and photophobia.  Respiratory: Negative for cough, hemoptysis and shortness of breath.   Cardiovascular: Negative for palpitations, orthopnea and leg swelling.  Gastrointestinal: Negative for abdominal pain, diarrhea and vomiting.  Genitourinary: Negative for dysuria and urgency.  Musculoskeletal: Negative for myalgias and neck pain.  Skin: Negative for rash.  Neurological: Negative for dizziness, focal weakness, seizures, weakness and headaches.  Psychiatric/Behavioral: Negative for memory loss. The patient does not have insomnia.     No Known Allergies  VITALS:  Blood pressure 100/64, pulse 98, temperature 98.2 F (36.8 C), temperature source Oral, resp. rate 20, height 5\' 11"  (1.803 m), weight 73.1 kg (161 lb 3.2 oz), SpO2 94 %.  PHYSICAL EXAMINATION:  GENERAL:  83 y.o.-year-old patient lying in the bed with no acute distress.  EYES: Pupils equal, round, reactive to light . No scleral icterus. Extraocular muscles intact.  HEENT: Head atraumatic, normocephalic. Oropharynx and nasopharynx clear.  NG-tube in place, draining copious amount of bilious fluid, NG tube to LIS. NECK:  Supple, no jugular venous distention. No thyroid enlargement, no tenderness.   LUNGS: Normal breath sounds bilaterally, no wheezing, rales,rhonchi or crepitation. No use of accessory muscles of respiration.  CARDIOVASCULAR: S1, S2 normal. No murmurs, rubs, or gallops.  ABDOMEN: Soft, nontender, nondistended. Bowel sounds present. No organomegaly or mass.  EXTREMITIES: No pedal edema, cyanosis, or clubbing.  NEUROLOGIC: Cranial nerves II through XII are intact. Muscle strength 5/5 in all extremities. Sensation intact. Gait not checked.  PSYCHIATRIC: The patient is alert and oriented x 3.  SKIN: No obvious rash, lesion, or ulcer.    LABORATORY PANEL:   CBC Recent Labs  Lab 05/11/17 0321  WBC 23.6*  HGB 15.1  HCT 46.5  PLT 234   ------------------------------------------------------------------------------------------------------------------  Chemistries  Recent Labs  Lab 05/10/17 1943 05/11/17 0321  NA 137 137  K 3.9 4.5  CL 100* 101  CO2 27 29  GLUCOSE 121* 152*  BUN 16 17  CREATININE 1.16 1.17  CALCIUM 9.2 9.0  AST 34  --   ALT 13*  --   ALKPHOS 75  --   BILITOT 0.9  --    ------------------------------------------------------------------------------------------------------------------  Cardiac Enzymes No results for input(s): TROPONINI in the last 168 hours. ------------------------------------------------------------------------------------------------------------------  RADIOLOGY:  Dg Abdomen 1 View  Result Date: 05/10/2017 CLINICAL DATA:  83 year old male with NG tube placement. EXAM: ABDOMEN - 1 VIEW COMPARISON:  Abdominal CT dated 05/10/2017 FINDINGS: An enteric tube is partially visualized with tip in the right lower hemiabdomen likely in the distal stomach. Dilated loops of small bowel in the left lower abdomen measure up to 3.8 cm. Excreted contrast in the renal collecting systems noted. Bilateral lower lung field nodularity and densities in keeping with known  metastatic disease. IMPRESSION: 1. Enteric tube with tip likely in the  distal stomach. 2. Dilated small-bowel loops in the left lower quadrant. 3. Bibasilar pulmonary densities/metastatic disease. Electronically Signed   By: Anner Crete M.D.   On: 05/10/2017 23:32   Ct Abdomen Pelvis W Contrast  Result Date: 05/10/2017 CLINICAL DATA:  Acute onset of umbilical abdominal pain. EXAM: CT ABDOMEN AND PELVIS WITH CONTRAST TECHNIQUE: Multidetector CT imaging of the abdomen and pelvis was performed using the standard protocol following bolus administration of intravenous contrast. CONTRAST:  5mL ISOVUE-370 IOPAMIDOL (ISOVUE-370) INJECTION 76% COMPARISON:  CT of the abdomen and pelvis from 08/02/2014, and CT of the chest performed 07/27/2015 FINDINGS: Lower chest: Innumerable nodules are noted scattered throughout both lung bases, compatible with metastatic disease to the lungs. Mild underlying emphysema is noted. Scattered coronary artery calcifications are seen. Hepatobiliary: Hypodensities within the liver measure up to 1.8 cm in size. These appear relatively stable from 2016 and likely reflect cysts. Stones are noted within the gallbladder. The gallbladder is partially decompressed and otherwise unremarkable. The common bile duct remains borderline normal in caliber for the patient's age. Pancreas: The pancreas is within normal limits. There appears to be two small 3 mm stones at the pancreatic duct at the level of the pancreatic head, without evidence of dilatation of the pancreatic duct. Spleen: The spleen is unremarkable in appearance. Adrenals/Urinary Tract: The adrenal glands are unremarkable in appearance. Scattered bilateral renal cysts are noted bilaterally, more prominent on the left. There is no evidence of hydronephrosis. No renal or ureteral stones are seen. There is no evidence of hydronephrosis. Mild nonspecific perinephric stranding is noted bilaterally. Stomach/Bowel: There is distention of small-bowel loops to 3.4 cm in maximal diameter, with gradual fecalization  at the mid ileum at the right lower quadrant. Trace associated free fluid and mesenteric inflammation are seen. The transition point appears just proximal to the herniated segment of small bowel at the small to moderate right inguinal hernia. A bowel suture line is noted at the herniated segment. Residual air is noted within the more distal small bowel, suggesting this reflects small bowel dysmotility rather than obstruction. Additional bowel suture lines are seen along the ascending colon and sigmoid colon. The colon is partially filled with stool and grossly unremarkable in appearance. The stomach is partially filled with fluid and grossly unremarkable. Vascular/Lymphatic: There is aneurysmal dilatation of the infrarenal abdominal aorta to 7.1 cm in transverse dimension and 6.3 cm in AP dimension, with mild associated thrombosis in the aneurysm sac. Scattered calcification is seen along the abdominal aorta and its branches. Aneurysmal dilatation resolves at the level of the aortic bifurcation. No retroperitoneal or pelvic sidewall lymphadenopathy is seen. Right mid abdominal mesenteric nodes measure up to 1.4 cm in short axis, raising concern for metastatic disease from the patient's known colon cancer. Reproductive: The bladder is mildly distended and grossly unremarkable. The prostate is mildly enlarged, measuring 5.1 cm in transverse dimension. Other: A small relatively broad-based anterior abdominal wall hernia is noted superior and to the left of the umbilicus. This contains only fat. Musculoskeletal: No acute osseous abnormalities are identified. There is mild grade 1 retrolisthesis of L2 on L3, and mild grade 1 anterolisthesis of L4 on L5, reflecting underlying facet disease. The visualized musculature is unremarkable in appearance. IMPRESSION: 1. Distention of small-bowel loops to 3.4 cm in maximal diameter, with gradual fecalization at the mid abdomen at the right lower quadrant. Trace associated free  fluid and mesenteric inflammation seen. The  transition point is just proximal to the herniated segment of small bowel at the right inguinal hernia. Residual air noted within distal small bowel, suggesting that this reflects small bowel dysmotility and possibly ileitis, rather than small bowel obstruction. 2. Herniation of a short segment of distal ileum into a small to moderate right inguinal hernia, with associated bowel suture line. No definite evidence for obstruction at this location. 3. Small left periumbilical hernia contains only fat, and is unremarkable in appearance. 4. Aneurysmal dilatation of the infrarenal abdominal aorta to 7.1 cm in transverse dimension and 6.3 cm in AP dimension, with mild associated thrombosis in the aneurysm sac. This has increased significantly in size from 5.5 cm and 4.9 cm, respectively, in 2016. Vascular surgery consultation recommended due to increased risk of rupture for AAA > 5.5 cm, if deemed clinically appropriate. This recommendation follows ACR consensus guidelines: White Paper of the ACR Incidental Findings Committee II on Vascular Findings. J Am Coll Radiol 2013; 10:789-794. 5. Innumerable nodules scattered throughout both lung bases, compatible with metastatic disease to the lungs. This is new from 2017. 6. Prominent mesenteric nodes at the right mid abdomen, measuring up to 1.4 cm in short axis, likely reflecting metastatic disease from the patient's known colon cancer. 7. Cholelithiasis.  Gallbladder otherwise grossly unremarkable. 8. Two small 3 mm stones at the pancreatic duct at the level of the pancreatic head, without evidence for pancreatic duct dilatation. 9. Stable appearing hepatic and renal cysts noted. 10. Mildly enlarged prostate. Aortic Atherosclerosis (ICD10-I70.0). Electronically Signed   By: Garald Balding M.D.   On: 05/10/2017 21:46    EKG:   Orders placed or performed during the hospital encounter of 05/10/17  . EKG 12-Lead  . EKG 12-Lead     ASSESSMENT AND PLAN:   #1 abdominal pain secondary to small bowel obstruction, recurrent colon cancer with metastases to lungs: No antibiotic treatment with NG tube to LIS, patient is at high risk for any surgical intervention and also family does not want to be nonsurgical measures.  Palliative care consult requested, continue morphine for pain control, continue NG tube to LIS, surgery is following.  Continue n.p.o. today, WBC is elevated, start empiric antibiotics for bowel infection. 2.  History of CAD, history of unstable angina.:  Patient is on aspirin, statins, 3.  History of COPD on oxygen 2 L, pulse up with pulmonary Dr. Ashby Dawes. 4.  For COPD patient is on Anoro Ellipta, Ventolin. Discussed with patient, patient's daughter at bedside. All the records are reviewed and case discussed with Care Management/Social Workerr. Management plans discussed with the patient, family and they are in agreement.  CODE STATUS: dnr TOTAL TIME TAKING CARE OF THIS PATIENT: 35 minutes.   POSSIBLE D/C IN 1-2 DAYS, DEPENDING ON CLINICAL CONDITION.   Epifanio Lesches M.D on 05/11/2017 at 9:43 AM  Between 7am to 6pm - Pager - [phone]  After 6pm go to [email or web address] - password [email or web address] [facility]  [facility]  Office  [phone]  CC: Primary care physician; Leone Haven, MD   Note: This dictation was prepared with Dragon dictation along with smaller phrase technology. Any transcriptional errors that result from this process are unintentional.

## 2017-05-11 NOTE — Progress Notes (Signed)
I spoke with Dr. Kathlene Cote about Mr. Custard.  Due to extensive pulmonary mets the patient is a bad candidate for sedation and therefore a bad candidate for venting gastrostomy tube.  I then conferred with Dr. Vianne Bulls and agree with her that it is worth a few days of conservative treatment with an N/G in place to see if he improves before going home with Hospice.     Florentina Jenny, PA-C Palliative Medicine Pager: [phone]

## 2017-05-11 NOTE — Consult Note (Signed)
[place]  [phone] [phone]  ID: Tollie Eth OB: 08-Dec-1918  MR#: [id number]  AXK#:[id number]  Patient Care Team: Leone Haven, MD as PCP - General (Family Medicine)  CHIEF COMPLAINT: Small bowel obstruction, stage IV colon cancer.  INTERVAL HISTORY: Patient is a 83 year old male who was last seen in the cancer center in April 2017.  At that time he was noted to have stage III colon cancer, but given his advanced age no adjuvant treatment was pursued.  He recently presented to the ER with worsening abdominal pain and noted to have a small bowel obstruction.  Imaging also revealed widely metastatic disease consistent with his known colon cancer.  Currently he feels improved since admission, but not back to his baseline.  He continues to have mild abdominal  bloating, but denies pain.  He has no neurologic complaints.  He denies any recent fevers or illnesses.  He denies any chest pain, shortness of breath, cough, or hemoptysis.  He denies any constipation or diarrhea.  He has no urinary complaints.  Patient otherwise feels well and offers no further specific complaints today.  REVIEW OF SYSTEMS:   Review of Systems  Constitutional: Negative.  Negative for fever and malaise/fatigue.  Respiratory: Negative.  Negative for cough and shortness of breath.   Cardiovascular: Negative.  Negative for chest pain and leg swelling.  Gastrointestinal: Positive for abdominal pain.  Genitourinary: Negative.   Skin: Negative.  Negative for rash.  Neurological: Negative.  Negative for weakness.  Psychiatric/Behavioral: Negative.  The patient is not nervous/anxious.     As per HPI. Otherwise, a complete review of systems is negative.  PAST MEDICAL HISTORY: Past Medical History:  Diagnosis Date  . Anemia   . CAD (coronary artery disease)    s/p MI and PCI  . CKD (chronic kidney disease)   . Colon cancer ([place])   . COPD (chronic obstructive  pulmonary disease) ([place])   . Hyperlipidemia   . Rhinitis     PAST SURGICAL HISTORY: Past Surgical History:  Procedure Laterality Date  . COLON SURGERY     ostomy (reversed in december)  . COLONOSCOPY  05/13/2004  . CORONARY ANGIOPLASTY  02/2007  . ESOPHAGOGASTRODUODENOSCOPY  05/11/2014  . FLEXIBLE SIGMOIDOSCOPY  05/14/2014    FAMILY HISTORY: Family History  Problem Relation Age of Onset  . Heart disease Father   . Heart disease Brother     ADVANCED DIRECTIVES (Y/N):  @ADVDIR @  HEALTH MAINTENANCE: Social History   Tobacco Use  . Smoking status: Never Smoker  . Smokeless tobacco: Never Used  Substance Use Topics  . Alcohol use: No  . Drug use: No     Colonoscopy:  PAP:  Bone density:  Lipid panel:  No Known Allergies  Current Facility-Administered Medications  Medication Dose Route Frequency Provider Last Rate Last Dose  . 0.9 %  sodium chloride infusion   Intravenous Continuous Max Sane, MD 75 mL/hr at 05/11/17 0247    . acetaminophen (TYLENOL) tablet 650 mg  650 mg Oral Q6H PRN Max Sane, MD       Or  . acetaminophen (TYLENOL) suppository 650 mg  650 mg Rectal Q6H PRN Max Sane, MD      . aspirin chewable tablet 81 mg  81 mg Oral Daily Max Sane, MD   Stopped at 05/11/17 [phone]  . bisacodyl (DULCOLAX) EC tablet 5 mg  5 mg Oral Daily PRN Max Sane, MD      . docusate sodium (COLACE)  capsule 100 mg  100 mg Oral BID Max Sane, MD   Stopped at 05/11/17 0311  . heparin injection 5,000 Units  5,000 Units Subcutaneous Q8H Max Sane, MD   5,000 Units at 05/11/17 0654  . ipratropium-albuterol (DUONEB) 0.5-2.5 (3) MG/3ML nebulizer solution 3 mL  3 mL Nebulization TID Max Sane, MD   3 mL at 05/11/17 0844  . isosorbide mononitrate (IMDUR) 24 hr tablet 30 mg  30 mg Oral Daily Max Sane, MD   Stopped at 05/11/17 [phone]  . morphine 2 MG/ML injection 1 mg  1 mg Intravenous Q4H PRN Saundra Shelling, MD   1 mg at 05/11/17 0245  . morphine CONCENTRATE 10 MG/0.5ML  oral solution 5 mg  5 mg Oral Q2H PRN Dellinger, Bobby Rumpf, PA-C      . nitroGLYCERIN (NITROSTAT) SL tablet 0.4 mg  0.4 mg Sublingual Q5 min PRN Max Sane, MD      . ondansetron (ZOFRAN) tablet 4 mg  4 mg Oral Q6H PRN Max Sane, MD       Or  . ondansetron (ZOFRAN) injection 4 mg  4 mg Intravenous Q6H PRN Max Sane, MD      . phenol (CHLORASEPTIC) mouth spray 1 spray  1 spray Mouth/Throat PRN Epifanio Lesches, MD   1 spray at 05/11/17 1208  . traZODone (DESYREL) tablet 25 mg  25 mg Oral QHS PRN Max Sane, MD      . umeclidinium-vilanterol (ANORO ELLIPTA) 62.5-25 MCG/INH 1 puff  1 puff Inhalation Daily Max Sane, MD   1 puff at 05/11/17 1208  . vitamin C (ASCORBIC ACID) tablet 500 mg  500 mg Oral Daily Max Sane, MD   Stopped at 05/11/17 0945    OBJECTIVE: Vitals:   05/11/17 0847 05/11/17 1100  BP:  105/65  Pulse:  95  Resp:  19  Temp:  98 F (36.7 C)  SpO2: 94% 95%     Body mass index is 22.48 kg/m.    ECOG FS:2 - Symptomatic, <50% confined to bed  General: Well-developed, well-nourished, no acute distress. Eyes: Pink conjunctiva, anicteric sclera. HEENT: NG tube in place lungs: Clear to auscultation bilaterally. Heart: Regular rate and rhythm. No rubs, murmurs, or gallops. Abdomen: Mildly distended, nontender.   Musculoskeletal: No edema, cyanosis, or clubbing. Neuro: Alert, answering all questions appropriately. Cranial nerves grossly intact. Skin: No rashes or petechiae noted. Psych: Normal affect.    LAB RESULTS:  Lab Results  Component Value Date   NA 137 05/11/2017   K 4.5 05/11/2017   CL 101 05/11/2017   CO2 29 05/11/2017   GLUCOSE 152 (H) 05/11/2017   BUN 17 05/11/2017   CREATININE 1.17 05/11/2017   CALCIUM 9.0 05/11/2017   PROT 7.3 05/10/2017   ALBUMIN 3.7 05/10/2017   AST 34 05/10/2017   ALT 13 (L) 05/10/2017   ALKPHOS 75 05/10/2017   BILITOT 0.9 05/10/2017   GFRNONAA 51 (L) 05/11/2017   GFRAA 59 (L) 05/11/2017    Lab Results    Component Value Date   WBC 23.6 (H) 05/11/2017   NEUTROABS 15.1 (H) 08/05/2015   HGB 15.1 05/11/2017   HCT 46.5 05/11/2017   MCV 85.7 05/11/2017   PLT 234 05/11/2017     STUDIES: Dg Abdomen 1 View  Result Date: 05/10/2017 CLINICAL DATA:  83 year old male with NG tube placement. EXAM: ABDOMEN - 1 VIEW COMPARISON:  Abdominal CT dated 05/10/2017 FINDINGS: An enteric tube is partially visualized with tip in the right lower hemiabdomen likely in the distal  stomach. Dilated loops of small bowel in the left lower abdomen measure up to 3.8 cm. Excreted contrast in the renal collecting systems noted. Bilateral lower lung field nodularity and densities in keeping with known metastatic disease. IMPRESSION: 1. Enteric tube with tip likely in the distal stomach. 2. Dilated small-bowel loops in the left lower quadrant. 3. Bibasilar pulmonary densities/metastatic disease. Electronically Signed   By: Anner Crete M.D.   On: 05/10/2017 23:32   Ct Abdomen Pelvis W Contrast  Result Date: 05/10/2017 CLINICAL DATA:  Acute onset of umbilical abdominal pain. EXAM: CT ABDOMEN AND PELVIS WITH CONTRAST TECHNIQUE: Multidetector CT imaging of the abdomen and pelvis was performed using the standard protocol following bolus administration of intravenous contrast. CONTRAST:  66mL ISOVUE-370 IOPAMIDOL (ISOVUE-370) INJECTION 76% COMPARISON:  CT of the abdomen and pelvis from 08/02/2014, and CT of the chest performed 07/27/2015 FINDINGS: Lower chest: Innumerable nodules are noted scattered throughout both lung bases, compatible with metastatic disease to the lungs. Mild underlying emphysema is noted. Scattered coronary artery calcifications are seen. Hepatobiliary: Hypodensities within the liver measure up to 1.8 cm in size. These appear relatively stable from 2016 and likely reflect cysts. Stones are noted within the gallbladder. The gallbladder is partially decompressed and otherwise unremarkable. The common bile duct  remains borderline normal in caliber for the patient's age. Pancreas: The pancreas is within normal limits. There appears to be two small 3 mm stones at the pancreatic duct at the level of the pancreatic head, without evidence of dilatation of the pancreatic duct. Spleen: The spleen is unremarkable in appearance. Adrenals/Urinary Tract: The adrenal glands are unremarkable in appearance. Scattered bilateral renal cysts are noted bilaterally, more prominent on the left. There is no evidence of hydronephrosis. No renal or ureteral stones are seen. There is no evidence of hydronephrosis. Mild nonspecific perinephric stranding is noted bilaterally. Stomach/Bowel: There is distention of small-bowel loops to 3.4 cm in maximal diameter, with gradual fecalization at the mid ileum at the right lower quadrant. Trace associated free fluid and mesenteric inflammation are seen. The transition point appears just proximal to the herniated segment of small bowel at the small to moderate right inguinal hernia. A bowel suture line is noted at the herniated segment. Residual air is noted within the more distal small bowel, suggesting this reflects small bowel dysmotility rather than obstruction. Additional bowel suture lines are seen along the ascending colon and sigmoid colon. The colon is partially filled with stool and grossly unremarkable in appearance. The stomach is partially filled with fluid and grossly unremarkable. Vascular/Lymphatic: There is aneurysmal dilatation of the infrarenal abdominal aorta to 7.1 cm in transverse dimension and 6.3 cm in AP dimension, with mild associated thrombosis in the aneurysm sac. Scattered calcification is seen along the abdominal aorta and its branches. Aneurysmal dilatation resolves at the level of the aortic bifurcation. No retroperitoneal or pelvic sidewall lymphadenopathy is seen. Right mid abdominal mesenteric nodes measure up to 1.4 cm in short axis, raising concern for metastatic  disease from the patient's known colon cancer. Reproductive: The bladder is mildly distended and grossly unremarkable. The prostate is mildly enlarged, measuring 5.1 cm in transverse dimension. Other: A small relatively broad-based anterior abdominal wall hernia is noted superior and to the left of the umbilicus. This contains only fat. Musculoskeletal: No acute osseous abnormalities are identified. There is mild grade 1 retrolisthesis of L2 on L3, and mild grade 1 anterolisthesis of L4 on L5, reflecting underlying facet disease. The visualized musculature is unremarkable in  appearance. IMPRESSION: 1. Distention of small-bowel loops to 3.4 cm in maximal diameter, with gradual fecalization at the mid abdomen at the right lower quadrant. Trace associated free fluid and mesenteric inflammation seen. The transition point is just proximal to the herniated segment of small bowel at the right inguinal hernia. Residual air noted within distal small bowel, suggesting that this reflects small bowel dysmotility and possibly ileitis, rather than small bowel obstruction. 2. Herniation of a short segment of distal ileum into a small to moderate right inguinal hernia, with associated bowel suture line. No definite evidence for obstruction at this location. 3. Small left periumbilical hernia contains only fat, and is unremarkable in appearance. 4. Aneurysmal dilatation of the infrarenal abdominal aorta to 7.1 cm in transverse dimension and 6.3 cm in AP dimension, with mild associated thrombosis in the aneurysm sac. This has increased significantly in size from 5.5 cm and 4.9 cm, respectively, in 2016. Vascular surgery consultation recommended due to increased risk of rupture for AAA > 5.5 cm, if deemed clinically appropriate. This recommendation follows ACR consensus guidelines: White Paper of the ACR Incidental Findings Committee II on Vascular Findings. J Am Coll Radiol 2013; 10:789-794. 5. Innumerable nodules scattered  throughout both lung bases, compatible with metastatic disease to the lungs. This is new from 2017. 6. Prominent mesenteric nodes at the right mid abdomen, measuring up to 1.4 cm in short axis, likely reflecting metastatic disease from the patient's known colon cancer. 7. Cholelithiasis.  Gallbladder otherwise grossly unremarkable. 8. Two small 3 mm stones at the pancreatic duct at the level of the pancreatic head, without evidence for pancreatic duct dilatation. 9. Stable appearing hepatic and renal cysts noted. 10. Mildly enlarged prostate. Aortic Atherosclerosis (ICD10-I70.0). Electronically Signed   By: Garald Balding M.D.   On: 05/10/2017 21:46    ASSESSMENT: Small bowel obstruction, stage IV colon cancer.  PLAN:    1.  Small bowel obstruction: CT scan results reviewed independently and report as above.  Appears to be related to his underlying abdominal hernia, but metastatic disease is not ruled out.  Irregardless of the etiology, patient and family have elected conservative management. NG tube is in place with improvement of his symptoms.  Appreciate surgical input.  No further interventions are needed at this time. 2.  Stage IV colon cancer.  CT scan results as above highly consistent with metastatic colon cancer.  Any further treatments with chemotherapy would likely not offer any benefit, plus patient has declined and is going home on hospice in 1-2 days once his symptoms resolved.  Appreciate palliative care input and management.  No follow-up is necessary in the cancer center. 3.  Leukocytosis: Likely reactive, monitor.  Appreciate consult, call with questions.   Lloyd Huger, MD   05/11/2017 2:31 PM

## 2017-05-11 NOTE — Progress Notes (Signed)
Chaplain received a consult for pt. who requested prayer. [place] met pt., wife, [name], and daughter, [name], and Palliative Care Physician Assistant at bedside. Pt talked about his first wife who passed away and how he cam to faith  In [place]; he talked about his accomplishment, his faith and values, his health challenges, and his new wife. Pt was animated and smiled broadly as he spoke about things he valued. Pt told [place] that he is tired of living an artificial life because he depends on hearing aids to hear and painkiller to kill arthritis pain in his legs. He also stated that he ready to spend eternity with the [facility]. [place] listened to pt, validated his feeling and offered prayer for pt to cope as pt had requested. Pt asked [place] to visit him again soon, which [place] agreed to do.    05/11/17 1100  Clinical Encounter Type  Visited With Patient;Patient and family together  Visit Type Initial;Spiritual support  Referral From Nurse  Consult/Referral To Chaplain  Spiritual Encounters  Spiritual Needs Prayer

## 2017-05-11 NOTE — H&P (Signed)
[facility] NAME: Douglas Ramsey    MR#:  [id number]  DATE OF BIRTH:  [birth year]  DATE OF ADMISSION:  05/10/2017  PRIMARY CARE PHYSICIAN: Leone Haven, MD   REQUESTING/REFERRING PHYSICIAN: Lavonia Drafts, MD  CHIEF COMPLAINT:   Chief Complaint  Patient presents with  . Abdominal Pain    HISTORY OF PRESENT ILLNESS:  Douglas Ramsey  is a 83 y.o. male with a known history of colon cancer, COPD, coronary artery disease status post stenting, CKD admitted for partial small bowel obstruction.  Patient presents with abdominal pain, nausea and vomiting.  Patient reports that approximately 3 PM today he developed central abdominal pain which has become gradually worse.  He has never had pain like this before. He reports that his pain is severe, colicky and intermittent. Pain is diffuse.  While in the ED he underwent CT scan of the abdomen and pelvis which shows metastatic disease.  Family and patient choose conservative management after discussion with surgery.  NG tube was placed. PAST MEDICAL HISTORY:   Past Medical History:  Diagnosis Date  . Anemia   . CAD (coronary artery disease)    s/p MI and PCI  . CKD (chronic kidney disease)   . Colon cancer ([place])   . COPD (chronic obstructive pulmonary disease) ([place])   . Hyperlipidemia   . Rhinitis    PAST SURGICAL HISTORY:   Past Surgical History:  Procedure Laterality Date  . COLON SURGERY     ostomy (reversed in december)  . COLONOSCOPY  05/13/2004  . CORONARY ANGIOPLASTY  02/2007  . ESOPHAGOGASTRODUODENOSCOPY  05/11/2014  . FLEXIBLE SIGMOIDOSCOPY  05/14/2014   SOCIAL HISTORY:   Social History   Tobacco Use  . Smoking status: Never Smoker  . Smokeless tobacco: Never Used  Substance Use Topics  . Alcohol use: No   FAMILY HISTORY:   Family History  Problem Relation Age of Onset  . Heart disease Father   . Heart disease Brother     DRUG ALLERGIES:  No Known  Allergies  REVIEW OF SYSTEMS:   Review of Systems  Constitutional: Positive for malaise/fatigue and weight loss. Negative for chills and fever.  HENT: Negative for nosebleeds and sore throat.   Eyes: Negative for blurred vision.  Respiratory: Negative for cough, shortness of breath and wheezing.   Cardiovascular: Negative for chest pain, orthopnea, leg swelling and PND.  Gastrointestinal: Positive for abdominal pain, nausea and vomiting. Negative for constipation, diarrhea and heartburn.  Genitourinary: Negative for dysuria and urgency.  Musculoskeletal: Negative for back pain.  Skin: Negative for rash.  Neurological: Positive for weakness. Negative for dizziness, speech change, focal weakness and headaches.  Endo/Heme/Allergies: Does not bruise/bleed easily.  Psychiatric/Behavioral: Negative for depression.    MEDICATIONS AT HOME:   Prior to Admission medications   Medication Sig Start Date End Date Taking? Authorizing Provider  ascorbic acid (VITAMIN C) 100 MG tablet Take 100 mg by mouth daily.   Yes [provider]  aspirin 81 MG tablet Take 81 mg by mouth daily.   Yes [provider]  Calcium Carbonate (CALCIUM 600 PO) Take 1 tablet by mouth daily.   Yes [provider]  Cyanocobalamin (VITAMIN B 12 PO) Take 1 tablet by mouth daily.   Yes [provider]  ferrous sulfate 325 (65 FE) MG tablet Take 325 mg by mouth daily.   Yes [provider]  glucosamine-chondroitin 500-400 MG tablet Take 1 tablet by  mouth 3 (three) times daily.   Yes [provider]  ipratropium-albuterol (DUONEB) 0.5-2.5 (3) MG/3ML SOLN Take 3 mLs by nebulization 3 (three) times daily. 03/07/17  Yes Laverle Hobby, MD  isosorbide mononitrate (IMDUR) 30 MG 24 hr tablet Take 30 mg by mouth daily.   Yes [provider]  nitroGLYCERIN (NITROSTAT) 0.4 MG SL tablet Place 1 tablet (0.4 mg total) under the tongue every 5 (five) minutes as needed for  chest pain. 10/10/15  Yes Cook, Jayce G, DO  Potassium 95 MG TABS Take 1 tablet by mouth daily.   Yes [provider]  simvastatin (ZOCOR) 20 MG tablet Take 1 tablet (20 mg total) by mouth daily. 07/09/16  Yes Coral Spikes, DO  Specialty Vitamins Products (MAGNESIUM, AMINO ACID CHELATE,) 133 MG tablet Take 1 tablet by mouth 2 (two) times daily.   Yes [provider]  umeclidinium-vilanterol (ANORO ELLIPTA) 62.5-25 MCG/INH AEPB Inhale 1 puff into the lungs daily.   Yes [provider]  VENTOLIN HFA 108 (90 Base) MCG/ACT inhaler Inhale 1-2 puffs into the lungs every 4 (four) hours as needed. 07/09/16  Yes Cook, Jayce G, DO  VITAMIN D, CHOLECALCIFEROL, PO Take 1 tablet by mouth daily.   Yes [provider]  Aluminum & Magnesium Hydroxide (MAGNESIUM-ALUMINUM PO) Take 1 tablet by mouth daily.    [provider]  NONFORMULARY OR COMPOUNDED ITEM [facility]:  Authorized Substitute Pain Cream - Ibuprofen 15%, Baclofen 1%, Gabapentin 3%, Lidocaine 2% apply 1-2 grams to affected area 3-4 times daily. 04/29/17   Edrick Kins, DPM      VITAL SIGNS:  Blood pressure 119/75, pulse 97, temperature 97.7 F (36.5 C), temperature source Oral, resp. rate 13, height 5\' 11"  (1.803 m), weight 68 kg (150 lb), SpO2 93 %.  PHYSICAL EXAMINATION:  Physical Exam  GENERAL:  83 y.o.-year-old patient lying in the bed with acute distress.  EYES: Pupils equal, round, reactive to light and accommodation. No scleral icterus. Extraocular muscles intact.  HEENT: Head atraumatic, normocephalic. Oropharynx and nasopharynx clear.  NECK:  Supple, no jugular venous distention. No thyroid enlargement, no tenderness.  LUNGS: Normal breath sounds bilaterally, no wheezing, rales,rhonchi or crepitation. No use of accessory muscles of respiration.  CARDIOVASCULAR: S1, S2 normal. No murmurs, rubs, or gallops.  ABDOMEN: The abdomen is soft, Reducible incisional hernia and right inguinal  hernia. diffuse tenderness to palpation. abd distension and decrease BS. EXTREMITIES: No pedal edema, cyanosis, or clubbing.  NEUROLOGIC: Cranial nerves II through XII are intact. Muscle strength 5/5 in all extremities. Sensation intact. Gait not checked.  PSYCHIATRIC: The patient is alert and oriented x 3.  SKIN: No obvious rash, lesion, or ulcer.  LABORATORY PANEL:   CBC Recent Labs  Lab 05/10/17 1943  WBC 14.8*  HGB 14.9  HCT 46.3  PLT 248   ------------------------------------------------------------------------------------------------------------------  Chemistries  Recent Labs  Lab 05/10/17 1943  NA 137  K 3.9  CL 100*  CO2 27  GLUCOSE 121*  BUN 16  CREATININE 1.16  CALCIUM 9.2  AST 34  ALT 13*  ALKPHOS 75  BILITOT 0.9   ------------------------------------------------------------------------------------------------------------------  Cardiac Enzymes No results for input(s): TROPONINI in the last 168 hours. ------------------------------------------------------------------------------------------------------------------  RADIOLOGY:  Dg Abdomen 1 View  Result Date: 05/10/2017 CLINICAL DATA:  83 year old male with NG tube placement. EXAM: ABDOMEN - 1 VIEW COMPARISON:  Abdominal CT dated 05/10/2017 FINDINGS: An enteric tube is partially visualized with tip in the right lower hemiabdomen likely in the distal  stomach. Dilated loops of small bowel in the left lower abdomen measure up to 3.8 cm. Excreted contrast in the renal collecting systems noted. Bilateral lower lung field nodularity and densities in keeping with known metastatic disease. IMPRESSION: 1. Enteric tube with tip likely in the distal stomach. 2. Dilated small-bowel loops in the left lower quadrant. 3. Bibasilar pulmonary densities/metastatic disease. Electronically Signed   By: Anner Crete M.D.   On: 05/10/2017 23:32   Ct Abdomen Pelvis W Contrast  Result Date: 05/10/2017 CLINICAL DATA:  Acute  onset of umbilical abdominal pain. EXAM: CT ABDOMEN AND PELVIS WITH CONTRAST TECHNIQUE: Multidetector CT imaging of the abdomen and pelvis was performed using the standard protocol following bolus administration of intravenous contrast. CONTRAST:  95mL ISOVUE-370 IOPAMIDOL (ISOVUE-370) INJECTION 76% COMPARISON:  CT of the abdomen and pelvis from 08/02/2014, and CT of the chest performed 07/27/2015 FINDINGS: Lower chest: Innumerable nodules are noted scattered throughout both lung bases, compatible with metastatic disease to the lungs. Mild underlying emphysema is noted. Scattered coronary artery calcifications are seen. Hepatobiliary: Hypodensities within the liver measure up to 1.8 cm in size. These appear relatively stable from 2016 and likely reflect cysts. Stones are noted within the gallbladder. The gallbladder is partially decompressed and otherwise unremarkable. The common bile duct remains borderline normal in caliber for the patient's age. Pancreas: The pancreas is within normal limits. There appears to be two small 3 mm stones at the pancreatic duct at the level of the pancreatic head, without evidence of dilatation of the pancreatic duct. Spleen: The spleen is unremarkable in appearance. Adrenals/Urinary Tract: The adrenal glands are unremarkable in appearance. Scattered bilateral renal cysts are noted bilaterally, more prominent on the left. There is no evidence of hydronephrosis. No renal or ureteral stones are seen. There is no evidence of hydronephrosis. Mild nonspecific perinephric stranding is noted bilaterally. Stomach/Bowel: There is distention of small-bowel loops to 3.4 cm in maximal diameter, with gradual fecalization at the mid ileum at the right lower quadrant. Trace associated free fluid and mesenteric inflammation are seen. The transition point appears just proximal to the herniated segment of small bowel at the small to moderate right inguinal hernia. A bowel suture line is noted at the  herniated segment. Residual air is noted within the more distal small bowel, suggesting this reflects small bowel dysmotility rather than obstruction. Additional bowel suture lines are seen along the ascending colon and sigmoid colon. The colon is partially filled with stool and grossly unremarkable in appearance. The stomach is partially filled with fluid and grossly unremarkable. Vascular/Lymphatic: There is aneurysmal dilatation of the infrarenal abdominal aorta to 7.1 cm in transverse dimension and 6.3 cm in AP dimension, with mild associated thrombosis in the aneurysm sac. Scattered calcification is seen along the abdominal aorta and its branches. Aneurysmal dilatation resolves at the level of the aortic bifurcation. No retroperitoneal or pelvic sidewall lymphadenopathy is seen. Right mid abdominal mesenteric nodes measure up to 1.4 cm in short axis, raising concern for metastatic disease from the patient's known colon cancer. Reproductive: The bladder is mildly distended and grossly unremarkable. The prostate is mildly enlarged, measuring 5.1 cm in transverse dimension. Other: A small relatively broad-based anterior abdominal wall hernia is noted superior and to the left of the umbilicus. This contains only fat. Musculoskeletal: No acute osseous abnormalities are identified. There is mild grade 1 retrolisthesis of L2 on L3, and mild grade 1 anterolisthesis of L4 on L5, reflecting underlying facet disease. The visualized musculature is unremarkable in  appearance. IMPRESSION: 1. Distention of small-bowel loops to 3.4 cm in maximal diameter, with gradual fecalization at the mid abdomen at the right lower quadrant. Trace associated free fluid and mesenteric inflammation seen. The transition point is just proximal to the herniated segment of small bowel at the right inguinal hernia. Residual air noted within distal small bowel, suggesting that this reflects small bowel dysmotility and possibly ileitis, rather than  small bowel obstruction. 2. Herniation of a short segment of distal ileum into a small to moderate right inguinal hernia, with associated bowel suture line. No definite evidence for obstruction at this location. 3. Small left periumbilical hernia contains only fat, and is unremarkable in appearance. 4. Aneurysmal dilatation of the infrarenal abdominal aorta to 7.1 cm in transverse dimension and 6.3 cm in AP dimension, with mild associated thrombosis in the aneurysm sac. This has increased significantly in size from 5.5 cm and 4.9 cm, respectively, in 2016. Vascular surgery consultation recommended due to increased risk of rupture for AAA > 5.5 cm, if deemed clinically appropriate. This recommendation follows ACR consensus guidelines: White Paper of the ACR Incidental Findings Committee II on Vascular Findings. J Am Coll Radiol 2013; 10:789-794. 5. Innumerable nodules scattered throughout both lung bases, compatible with metastatic disease to the lungs. This is new from 2017. 6. Prominent mesenteric nodes at the right mid abdomen, measuring up to 1.4 cm in short axis, likely reflecting metastatic disease from the patient's known colon cancer. 7. Cholelithiasis.  Gallbladder otherwise grossly unremarkable. 8. Two small 3 mm stones at the pancreatic duct at the level of the pancreatic head, without evidence for pancreatic duct dilatation. 9. Stable appearing hepatic and renal cysts noted. 10. Mildly enlarged prostate. Aortic Atherosclerosis (ICD10-I70.0). Electronically Signed   By: Garald Balding M.D.   On: 05/10/2017 21:46   IMPRESSION AND PLAN:  83 year old male being admitted for partial small bowel obstruction  *Partial small bowel obstruction -Conservative management -NG tube placed - decompression -Surgery to follow -Daily KUB  *Suspected metastatic cancer -Seen on CT scan -Oncology consult -Does have a known history of colon cancer diagnosed about 3 years ago  *Coronary artery disease -Continue  aspirin, Imdur  *Severe protein calorie malnutrition -Has been losing weight due to poor nutrition -At least 15-20 pounds over last 1 year per his daughter   Consult palliative care.  Family interested in hospice services at home.    All the records are reviewed and case discussed with ED provider. Management plans discussed with the patient, family (daughter at bedside) and they are in agreement.  CODE STATUS: DNR  TOTAL TIME TAKING CARE OF THIS PATIENT: 45 minutes.    Max Sane M.D on 05/11/2017 at 12:46 AM  Between 7am to 6pm - Pager - [phone]  After 6pm go to [email or web address] - [employer] [facility]  Office  [phone]  CC: Primary care physician; Leone Haven, MD   Note: This dictation was prepared with Dragon dictation along with smaller phrase technology. Any transcriptional errors that result from this process are unintentional.

## 2017-05-11 NOTE — ED Provider Notes (Signed)
[facility] Emergency Department Provider Note   ____________________________________________    I have reviewed the triage vital signs and the nursing notes.   HISTORY  Chief Complaint Abdominal Pain     HPI Douglas Ramsey is a 83 y.o. male who presents with abdominal pain.  Patient reports that approximately 3 PM today he developed central abdominal pain which has become gradually worse.  He has never had pain like this before.  He does note that he has an aortic aneurysm which is approximately 5 cm.  He denies nausea and vomiting.  He reports the pain is severe and sharp in nature.  It comes in waves.  He is not take anything for it.  No fevers or chills reported.  Normal bowel movements   Past Medical History:  Diagnosis Date  . Anemia   . CAD (coronary artery disease)    s/p MI and PCI  . CKD (chronic kidney disease)   . Colon cancer ([place])   . COPD (chronic obstructive pulmonary disease) ([place])   . Hyperlipidemia   . Rhinitis     Patient Active Problem List   Diagnosis Date Noted  . SBO (small bowel obstruction) ([place]) 05/11/2017  . Ileus, unspecified ([place])   . Nocturnal leg cramps 10/19/2016  . OAB (overactive bladder) 04/20/2016  . Pleural plaque 12/02/2015  . AAA (abdominal aortic aneurysm) without rupture ([place]) 10/23/2015  . CKD (chronic kidney disease) stage 3, GFR 30-59 ml/min ([facility]) 10/10/2015  . Vasomotor rhinitis 10/10/2015  . Incisional hernia 10/10/2015  . Right inguinal hernia 10/10/2015  . Malignant neoplasm of sigmoid colon ([place]) 11/19/2014  . MI (mitral incompetence) 09/09/2014  . Beat, premature ventricular 06/17/2014  . CAD (coronary artery disease) 05/09/2014  . Acid reflux 05/09/2014  . Mixed hyperlipidemia 05/09/2014  . Moderate COPD (chronic obstructive pulmonary disease) ([place]) 12/07/2013    Past Surgical History:  Procedure Laterality Date  . COLON SURGERY     ostomy (reversed in december)  . COLONOSCOPY   05/13/2004  . CORONARY ANGIOPLASTY  02/2007  . ESOPHAGOGASTRODUODENOSCOPY  05/11/2014  . FLEXIBLE SIGMOIDOSCOPY  05/14/2014    Prior to Admission medications   Medication Sig Start Date End Date Taking? Authorizing Provider  ascorbic acid (VITAMIN C) 100 MG tablet Take 100 mg by mouth daily.   Yes [provider]  aspirin 81 MG tablet Take 81 mg by mouth daily.   Yes [provider]  Calcium Carbonate (CALCIUM 600 PO) Take 1 tablet by mouth daily.   Yes [provider]  Cyanocobalamin (VITAMIN B 12 PO) Take 1 tablet by mouth daily.   Yes [provider]  ferrous sulfate 325 (65 FE) MG tablet Take 325 mg by mouth daily.   Yes [provider]  glucosamine-chondroitin 500-400 MG tablet Take 1 tablet by mouth 3 (three) times daily.   Yes [provider]  ipratropium-albuterol (DUONEB) 0.5-2.5 (3) MG/3ML SOLN Take 3 mLs by nebulization 3 (three) times daily. 03/07/17  Yes Laverle Hobby, MD  isosorbide mononitrate (IMDUR) 30 MG 24 hr tablet Take 30 mg by mouth daily.   Yes [provider]  nitroGLYCERIN (NITROSTAT) 0.4 MG SL tablet Place 1 tablet (0.4 mg total) under the tongue every 5 (five) minutes as needed for chest pain. 10/10/15  Yes Cook, Jayce G, DO  Potassium 95 MG TABS Take 1 tablet by mouth daily.   Yes [provider]  simvastatin (ZOCOR) 20 MG tablet Take 1 tablet (20 mg total)  by mouth daily. 07/09/16  Yes Coral Spikes, DO  Specialty Vitamins Products (MAGNESIUM, AMINO ACID CHELATE,) 133 MG tablet Take 1 tablet by mouth 2 (two) times daily.   Yes [provider]  umeclidinium-vilanterol (ANORO ELLIPTA) 62.5-25 MCG/INH AEPB Inhale 1 puff into the lungs daily.   Yes [provider]  VENTOLIN HFA 108 (90 Base) MCG/ACT inhaler Inhale 1-2 puffs into the lungs every 4 (four) hours as needed. 07/09/16  Yes Cook, Jayce G, DO  VITAMIN D, CHOLECALCIFEROL, PO Take 1 tablet by mouth daily.   Yes  [provider]  Aluminum & Magnesium Hydroxide (MAGNESIUM-ALUMINUM PO) Take 1 tablet by mouth daily.    [provider]  NONFORMULARY OR COMPOUNDED ITEM [facility]:  Authorized Substitute Pain Cream - Ibuprofen 15%, Baclofen 1%, Gabapentin 3%, Lidocaine 2% apply 1-2 grams to affected area 3-4 times daily. 04/29/17   Edrick Kins, DPM     Allergies Patient has no known allergies.  Family History  Problem Relation Age of Onset  . Heart disease Father   . Heart disease Brother     Social History Social History   Tobacco Use  . Smoking status: Never Smoker  . Smokeless tobacco: Never Used  Substance Use Topics  . Alcohol use: No  . Drug use: No    Review of Systems  Constitutional: No fever/chills Eyes: No visual changes.  ENT: No sore throat. Cardiovascular: Denies chest pain. Respiratory: Denies shortness of breath. Gastrointestinal: As above Genitourinary: Negative for dysuria. Musculoskeletal: Negative for back pain. Skin: Negative for rash. Neurological: Negative for headaches   ____________________________________________   PHYSICAL EXAM:  VITAL SIGNS: ED Triage Vitals  Enc Vitals Group     BP 05/10/17 1933 112/73     Pulse Rate 05/10/17 1933 84     Resp 05/10/17 1933 (!) 28     Temp 05/10/17 1933 97.7 F (36.5 C)     Temp Source 05/10/17 1933 Oral     SpO2 05/10/17 1933 93 %     Weight 05/10/17 1932 68 kg (150 lb)     Height 05/10/17 1932 1.803 m (5\' 11" )     Head Circumference --      Peak Flow --      Pain Score 05/10/17 1932 10     Pain Loc --      Pain Edu? --      Excl. in [place]? --     Constitutional: Alert and oriented.  Uncomfortable  eyes: Conjunctivae are normal.   Nose: No congestion/rhinnorhea. Mouth/Throat: Mucous membranes are moist.    Cardiovascular: Normal rate, regular rhythm. Grossly normal heart sounds.  Good peripheral circulation. Respiratory: Normal respiratory effort.  No retractions. Lungs  CTAB. Gastrointestinal: Ventral hernia, easily reducible.  Tenderness to palpation around the umbilicus.  No distention.  No CVA tenderness. Genitourinary: deferred Musculoskeletal: .  Warm and well perfused Neurologic:  Normal speech and language. No gross focal neurologic deficits are appreciated.  Skin:  Skin is warm, dry and intact. No rash noted. Psychiatric: Mood and affect are normal. Speech and behavior are normal.  ____________________________________________   LABS (all labs ordered are listed, but only abnormal results are displayed)  Labs Reviewed  CBC - Abnormal; Notable for the following components:      Result Value   WBC 14.8 (*)    RDW 16.4 (*)    All other components within normal limits  COMPREHENSIVE METABOLIC PANEL - Abnormal; Notable for the following components:   Chloride  100 (*)    Glucose, Bld 121 (*)    ALT 13 (*)    GFR calc non Af Amer 51 (*)    GFR calc Af Amer 59 (*)    All other components within normal limits  LIPASE, BLOOD  APTT  PROTIME-INR   ____________________________________________  EKG  ED ECG REPORT I, Lavonia Drafts, the attending physician, personally viewed and interpreted this ECG.  Date: 05/11/2017  Rhythm: normal sinus rhythm QRS Axis: normal Intervals: normal ST/T Wave abnormalities: normal Narrative Interpretation: no evidence of acute ischemia  ____________________________________________  RADIOLOGY  CT scan multiple findings, primary concern for possible small bowel obstruction.  AAA has increased to 7.1 cm.  Multiple nodules in the lungs, discussed with patient and family ____________________________________________   PROCEDURES  Procedure(s) performed: No  Procedures   Critical Care performed: No ____________________________________________   INITIAL IMPRESSION / ASSESSMENT AND PLAN / ED COURSE  Pertinent labs & imaging results that were available during my care of the patient were reviewed by me  and considered in my medical decision making (see chart for details).  Patient presents with severe abdominal pain.  IV analgesics given, labs demonstrate elevated white blood cell count but otherwise unremarkable.  CT scan demonstrates finding as above  Consulted surgery who recommends NG tube and admission to medicine      ____________________________________________   FINAL CLINICAL IMPRESSION(S) / ED DIAGNOSES  Final diagnoses:  Generalized abdominal pain  Other partial intestinal obstruction ([place])        Note:  This document was prepared using Dragon voice recognition software and may include unintentional dictation errors.    Lavonia Drafts, MD 05/11/17 [phone]

## 2017-05-11 NOTE — Consult Note (Signed)
Consultation Note Date: 05/11/2017   Patient Name: Douglas Ramsey  DOB: 05-19-1918  MRN: [id number]  Age / Sex: 83 y.o., male  PCP: Leone Haven, MD Referring Physician: Epifanio Lesches, MD  Reason for Consultation: Establishing goals of care  HPI/Patient Profile: 83 y.o. male  with past medical history of colon cancer s/p surgery 3 yrs ago, AAA, CAD s/p stent, CKD, COPD who was admitted on 05/10/2017 with SBO. Imaging showed widely metastatic cancer.  An N/G was placed and the patient elects conservative measures.  I spoke with Dr. [place] Pink of radiology about the CT results.  It appears this obstruction may be caused by hernia (rather than tumor).  The patient does not want to undergo a major surgery.    Clinical Assessment and Goals of Care:  I have reviewed medical records including EPIC notes, labs and imaging, received report from the care team, assessed the patient and then met at the bedside along with his wife [name] (who has mild dementia, and one of his step daughters, [name])  to discuss diagnosis prognosis, GOC, EOL wishes, disposition and options.  I introduced Palliative Medicine as specialized medical care for people living with serious illness. It focuses on providing relief from the symptoms and stress of a serious illness. The goal is to improve quality of life for both the patient and the family.  We discussed a brief life review of the patient. 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 He has been married to Demarest, his 2nd wife, for 20 years.  He did not have children but Peggy had 7 and they have adopted the patient as their family.   They appear to be very close knit.  Olson remains very vibrant still walking around the house, and fairly active, but now has significant difficulty with breathing.  As far as functional and nutritional status.  He is essentially independent  with ADLs and was eating fairly well prior to admission.  We discussed their current illness and what it means in the larger context of their on-going co-morbidities.  Natural disease trajectory and expectations at EOL were discussed.  The patient's family does not want him to suffer.  They do not want him to suffer.  The patient would rather die naturally than have significant medical intervention or any artificial means of support.  I attempted to elicit values and goals of care important to the patient.  He declines any major surgery but is considering a venting gastrostomy tube.  Hospice and Palliative Care services outpatient were explained and offered.  The patient would like to go home with Hospice services when his hospitalization is complete.  Questions and concerns were addressed.  The family was encouraged to call with questions or concerns.    Primary Decision Maker:  PATIENT.  However he defers to his daughter [name] at bedside.  His step son Patrick Jupiter is his HCPOA if needed.    SUMMARY OF RECOMMENDATIONS    Consider venting gastrostomy if appropriate for comfort.  Continue conservative  therapy.  Hospice Services in his home when it is time for discharge  D/C home with SL morphine concentrate for dyspnea and pain.  Code Status/Advance Care Planning:  DNR  Psycho-social/Spiritual:   Desire for further Chaplaincy support: yes, greatly appreciated  Prognosis:   If the obstruction can be relieved he likely has 6 months or less due to widely metastatic cancer and breathing difficulties.   If the obstruction can not be relieved he has days to weeks as he will not be able to take in nutrition.  Discharge Planning: Home with Hospice when ready for D/C      Primary Diagnoses: Present on Admission: . SBO (small bowel obstruction) ([place])   I have reviewed the medical record, interviewed the patient and family, and examined the patient. The following aspects are  pertinent.  Past Medical History:  Diagnosis Date  . Anemia   . CAD (coronary artery disease)    s/p MI and PCI  . CKD (chronic kidney disease)   . Colon cancer ([place])   . COPD (chronic obstructive pulmonary disease) ([place])   . Hyperlipidemia   . Rhinitis    Social History   Socioeconomic History  . Marital status: Married    Spouse name: None  . Number of children: None  . Years of education: None  . Highest education level: None  Social Needs  . Financial resource strain: None  . Food insecurity - worry: None  . Food insecurity - inability: None  . Transportation needs - medical: None  . Transportation needs - non-medical: None  Occupational History  . None  Tobacco Use  . Smoking status: Never Smoker  . Smokeless tobacco: Never Used  Substance and Sexual Activity  . Alcohol use: No  . Drug use: No  . Sexual activity: No  Other Topics Concern  . None  Social History Narrative  . None   Family History  Problem Relation Age of Onset  . Heart disease Father   . Heart disease Brother    Scheduled Meds: . aspirin  81 mg Oral Daily  . docusate sodium  100 mg Oral BID  . heparin  5,000 Units Subcutaneous Q8H  . ipratropium-albuterol  3 mL Nebulization TID  . isosorbide mononitrate  30 mg Oral Daily  . umeclidinium-vilanterol  1 puff Inhalation Daily  . ascorbic acid  500 mg Oral Daily   Continuous Infusions: . sodium chloride 75 mL/hr at 05/11/17 0247   PRN Meds:.acetaminophen **OR** acetaminophen, bisacodyl, morphine injection, morphine CONCENTRATE, nitroGLYCERIN, ondansetron **OR** ondansetron (ZOFRAN) IV, phenol, traZODone No Known Allergies Review of Systems having good bowel movements, abdominal pain and nausea much improved with N/G placement.  Complains of left knee pain/stiffness and right foot pain.  Physical Exam  Very pleasant elderly gentleman, A&O, clear speech, very HOH CV rrr resp no distress with oxygen in place. Abdomen - soft, nt, +BS,  large mass in LLQ  Vital Signs: BP 100/64 (BP Location: Right Arm)   Pulse 98   Temp 98.2 F (36.8 C) (Oral)   Resp 20   Ht 5' 11"  (1.803 m)   Wt 73.1 kg (161 lb 3.2 oz)   SpO2 94%   BMI 22.48 kg/m  Pain Assessment: 0-10 POSS *See Group Information*: 1-Acceptable,Awake and alert Pain Score: 2    SpO2: SpO2: 94 % O2 Device:SpO2: 94 % O2 Flow Rate: .O2 Flow Rate (L/min): 2 L/min  IO: Intake/output summary:   Intake/Output Summary (Last 24 hours) at 05/11/2017 1046 Last data  filed at 05/11/2017 0931 Gross per 24 hour  Intake 563 ml  Output 650 ml  Net -87 ml    LBM: Last BM Date: 05/10/17 Baseline Weight: Weight: 68 kg (150 lb) Most recent weight: Weight: 73.1 kg (161 lb 3.2 oz)     Palliative Assessment/Data: 50%     Time In: 10:00 Time Out: 11:11 Time Total: 71 min. Greater than 50%  of this time was spent counseling and coordinating care related to the above assessment and plan.  Signed by: Florentina Jenny, PA-C Palliative Medicine Pager: [phone]  Please contact Palliative Medicine Team phone at [phone] for questions and concerns.  For individual provider: See Shea Evans

## 2017-05-11 NOTE — Progress Notes (Signed)
CC: Small bowel obstruction Subjective: Patient admitted overnight with a small bowel obstruction.  His primary complaint this morning is of discomfort in the throat from the NG tube.  States his abdomen is feeling somewhat better.  Objective: Vital signs in last 24 hours: Temp:  [97.7 F (36.5 C)-98.2 F (36.8 C)] 98.2 F (36.8 C) (12/26 0557) Pulse Rate:  [72-98] 98 (12/26 0557) Resp:  [11-28] 20 (12/26 0557) BP: (100-136)/(64-85) 100/64 (12/26 0557) SpO2:  [91 %-95 %] 94 % (12/26 0847) Weight:  [68 kg (150 lb)-73.1 kg (161 lb 3.2 oz)] 73.1 kg (161 lb 3.2 oz) (12/26 0557) Last BM Date: 05/10/17  Intake/Output from previous day: 12/25 0701 - 12/26 0700 In: 374 [I.V.:374] Out: 500 [Emesis/NG output:500] Intake/Output this shift: No intake/output data recorded.  Physical exam:  General: No acute distress Chest: Clear to auscultation Heart: Regular rate and rhythm Abdomen: Soft, nontender, nondistended  Lab Results: CBC  Recent Labs    05/10/17 1943 05/11/17 0321  WBC 14.8* 23.6*  HGB 14.9 15.1  HCT 46.3 46.5  PLT 248 234   BMET Recent Labs    05/10/17 1943 05/11/17 0321  NA 137 137  K 3.9 4.5  CL 100* 101  CO2 27 29  GLUCOSE 121* 152*  BUN 16 17  CREATININE 1.16 1.17  CALCIUM 9.2 9.0   PT/INR Recent Labs    05/10/17 1943  LABPROT 12.4  INR 0.93   ABG No results for input(s): PHART, HCO3 in the last 72 hours.  Invalid input(s): PCO2, PO2  Studies/Results: Dg Abdomen 1 View  Result Date: 05/10/2017 CLINICAL DATA:  83 year old male with NG tube placement. EXAM: ABDOMEN - 1 VIEW COMPARISON:  Abdominal CT dated 05/10/2017 FINDINGS: An enteric tube is partially visualized with tip in the right lower hemiabdomen likely in the distal stomach. Dilated loops of small bowel in the left lower abdomen measure up to 3.8 cm. Excreted contrast in the renal collecting systems noted. Bilateral lower lung field nodularity and densities in keeping with known  metastatic disease. IMPRESSION: 1. Enteric tube with tip likely in the distal stomach. 2. Dilated small-bowel loops in the left lower quadrant. 3. Bibasilar pulmonary densities/metastatic disease. Electronically Signed   By: Anner Crete M.D.   On: 05/10/2017 23:32   Ct Abdomen Pelvis W Contrast  Result Date: 05/10/2017 CLINICAL DATA:  Acute onset of umbilical abdominal pain. EXAM: CT ABDOMEN AND PELVIS WITH CONTRAST TECHNIQUE: Multidetector CT imaging of the abdomen and pelvis was performed using the standard protocol following bolus administration of intravenous contrast. CONTRAST:  65mL ISOVUE-370 IOPAMIDOL (ISOVUE-370) INJECTION 76% COMPARISON:  CT of the abdomen and pelvis from 08/02/2014, and CT of the chest performed 07/27/2015 FINDINGS: Lower chest: Innumerable nodules are noted scattered throughout both lung bases, compatible with metastatic disease to the lungs. Mild underlying emphysema is noted. Scattered coronary artery calcifications are seen. Hepatobiliary: Hypodensities within the liver measure up to 1.8 cm in size. These appear relatively stable from 2016 and likely reflect cysts. Stones are noted within the gallbladder. The gallbladder is partially decompressed and otherwise unremarkable. The common bile duct remains borderline normal in caliber for the patient's age. Pancreas: The pancreas is within normal limits. There appears to be two small 3 mm stones at the pancreatic duct at the level of the pancreatic head, without evidence of dilatation of the pancreatic duct. Spleen: The spleen is unremarkable in appearance. Adrenals/Urinary Tract: The adrenal glands are unremarkable in appearance. Scattered bilateral renal cysts are noted bilaterally, more  prominent on the left. There is no evidence of hydronephrosis. No renal or ureteral stones are seen. There is no evidence of hydronephrosis. Mild nonspecific perinephric stranding is noted bilaterally. Stomach/Bowel: There is distention of  small-bowel loops to 3.4 cm in maximal diameter, with gradual fecalization at the mid ileum at the right lower quadrant. Trace associated free fluid and mesenteric inflammation are seen. The transition point appears just proximal to the herniated segment of small bowel at the small to moderate right inguinal hernia. A bowel suture line is noted at the herniated segment. Residual air is noted within the more distal small bowel, suggesting this reflects small bowel dysmotility rather than obstruction. Additional bowel suture lines are seen along the ascending colon and sigmoid colon. The colon is partially filled with stool and grossly unremarkable in appearance. The stomach is partially filled with fluid and grossly unremarkable. Vascular/Lymphatic: There is aneurysmal dilatation of the infrarenal abdominal aorta to 7.1 cm in transverse dimension and 6.3 cm in AP dimension, with mild associated thrombosis in the aneurysm sac. Scattered calcification is seen along the abdominal aorta and its branches. Aneurysmal dilatation resolves at the level of the aortic bifurcation. No retroperitoneal or pelvic sidewall lymphadenopathy is seen. Right mid abdominal mesenteric nodes measure up to 1.4 cm in short axis, raising concern for metastatic disease from the patient's known colon cancer. Reproductive: The bladder is mildly distended and grossly unremarkable. The prostate is mildly enlarged, measuring 5.1 cm in transverse dimension. Other: A small relatively broad-based anterior abdominal wall hernia is noted superior and to the left of the umbilicus. This contains only fat. Musculoskeletal: No acute osseous abnormalities are identified. There is mild grade 1 retrolisthesis of L2 on L3, and mild grade 1 anterolisthesis of L4 on L5, reflecting underlying facet disease. The visualized musculature is unremarkable in appearance. IMPRESSION: 1. Distention of small-bowel loops to 3.4 cm in maximal diameter, with gradual  fecalization at the mid abdomen at the right lower quadrant. Trace associated free fluid and mesenteric inflammation seen. The transition point is just proximal to the herniated segment of small bowel at the right inguinal hernia. Residual air noted within distal small bowel, suggesting that this reflects small bowel dysmotility and possibly ileitis, rather than small bowel obstruction. 2. Herniation of a short segment of distal ileum into a small to moderate right inguinal hernia, with associated bowel suture line. No definite evidence for obstruction at this location. 3. Small left periumbilical hernia contains only fat, and is unremarkable in appearance. 4. Aneurysmal dilatation of the infrarenal abdominal aorta to 7.1 cm in transverse dimension and 6.3 cm in AP dimension, with mild associated thrombosis in the aneurysm sac. This has increased significantly in size from 5.5 cm and 4.9 cm, respectively, in 2016. Vascular surgery consultation recommended due to increased risk of rupture for AAA > 5.5 cm, if deemed clinically appropriate. This recommendation follows ACR consensus guidelines: White Paper of the ACR Incidental Findings Committee II on Vascular Findings. J Am Coll Radiol 2013; 10:789-794. 5. Innumerable nodules scattered throughout both lung bases, compatible with metastatic disease to the lungs. This is new from 2017. 6. Prominent mesenteric nodes at the right mid abdomen, measuring up to 1.4 cm in short axis, likely reflecting metastatic disease from the patient's known colon cancer. 7. Cholelithiasis.  Gallbladder otherwise grossly unremarkable. 8. Two small 3 mm stones at the pancreatic duct at the level of the pancreatic head, without evidence for pancreatic duct dilatation. 9. Stable appearing hepatic and renal cysts noted.  10. Mildly enlarged prostate. Aortic Atherosclerosis (ICD10-I70.0). Electronically Signed   By: Garald Balding M.D.   On: 05/10/2017 21:46     Anti-infectives: Anti-infectives (From admission, onward)   None      Assessment/Plan:  83 year old male with multiple medical problems admitted with a small bowel obstruction and evidence of recurrent, metastatic colon cancer.  On exam his abdomen is actually improved from his documented exam on presentation.  However his leukocytosis has worsened without a clear etiology.  Recommend continuing NG tube decompression.  No indication for operative intervention at this time.  General surgery will continue to follow with you.  Hanif Radin T. Adonis Huguenin, MD, [facility] General Surgeon [facility]  Day [facility] [phone] Night [facility] [phone] 05/11/2017

## 2017-05-11 NOTE — Progress Notes (Signed)
Family Meeting Note  Advance Directive:yes  Today a meeting took place with the Patient and daughter at bedside.  The following clinical team members were present during this meeting:MD  The following were discussed:Patient's diagnosis:   83 y.o. male with a known history of colon cancer, COPD, coronary artery disease status post stenting, CKD admitted for partial small bowel obstruction  Patient's progosis: < 6 weeks and Goals for treatment: DNR  Additional follow-up to be provided: Palliative care c/s, Home with Hospice  Time spent during discussion:20 minutes  Max Sane, MD

## 2017-05-11 NOTE — Progress Notes (Signed)
Initial Nutrition Assessment  DOCUMENTATION CODES:   Underweight  INTERVENTION:   RD will monitor for goals of care vs diet advancement vs the need for nutrition support  NUTRITION DIAGNOSIS:   Inadequate oral intake related to altered GI function as evidenced by NPO status.  GOAL:   Other (Comment)(comfort)  MONITOR:   Diet advancement, Labs, Weight trends, I & O's  REASON FOR ASSESSMENT:   Malnutrition Screening Tool    ASSESSMENT:   83 y.o. male  with past medical history of colon cancer s/p surgery 3 yrs ago, AAA, CAD s/p stent, CKD, COPD who was admitted on 05/10/2017 with SBO. Imaging showed widely metastatic cancer.  An N/G was placed and the patient elects conservative measures.   Reviewed pt's chart. Pt seen by palliative care today. Pt does not wish to undergo surgery at this time but is considering venting G-tube. Pt would like to go home with hospice when able to discharge. RD will continue to monitor for any nutrition related needs.   Medications reviewed and include: aspirin, colace, heparin, vit C, NaCl @75ml /hr, morphine  Labs reviewed: wbc- 23.6(H)  Unable to complete Nutrition-Focused physical exam at this time.   Diet Order:  Diet NPO time specified Except for: Sips with Meds  EDUCATION NEEDS:   No education needs have been identified at this time  Skin:  Reviewed RN Assessment  Last BM:  12/25  Height:   Ht Readings from Last 1 Encounters:  05/10/17 5\' 11"  (1.803 m)    Weight:   Wt Readings from Last 1 Encounters:  05/11/17 161 lb 3.2 oz (73.1 kg)    Ideal Body Weight:  78.18 kg  BMI:  Body mass index is 22.48 kg/m.  Estimated Nutritional Needs:   Kcal:  1800-2100kcal/day   Protein:  80-95g/day   Fluid:  >1.8L/day   Koleen Distance MS, RD, LDN Pager #[phone] After Hours Pager: [phone]

## 2017-05-11 NOTE — Care Management (Signed)
Admitted from home with abdominal pain and found to have metastatic colon cancer and small bowel obstruction.  nasogastric tube. Will not pursue surgical intervention.  Palliative consult is pending. Patient is DNR. Family considering home hospice plan of care

## 2017-05-11 NOTE — Progress Notes (Addendum)
PHARMACIST - PHYSICIAN ORDER COMMUNICATION  CONCERNING: P&T Medication Policy on Herbal Medications  DESCRIPTION:  This patient's order for:  Glucosamine-chondroitin, potassium tabs 95 mg, magnesium (amino acid chelate)  has been noted.  This product(s) is classified as an "herbal" or natural product. Due to a lack of definitive safety studies or FDA approval, nonstandard manufacturing practices, plus the potential risk of unknown drug-drug interactions while on inpatient medications, the Pharmacy and Therapeutics Committee does not permit the use of "herbal" or natural products of this type within [facility].   ACTION TAKEN: The pharmacy department is unable to verify this order at this time. Please reevaluate patient's clinical condition at discharge and address if the herbal or natural product(s) should be resumed at that time.

## 2017-05-12 DIAGNOSIS — R1084 Generalized abdominal pain: Secondary | ICD-10-CM

## 2017-05-12 LAB — GLUCOSE, CAPILLARY
Glucose-Capillary: 110 mg/dL — ABNORMAL HIGH (ref 65–99)
Glucose-Capillary: 97 mg/dL (ref 65–99)

## 2017-05-12 MED ORDER — HALOPERIDOL 0.5 MG PO TABS
0.5000 mg | ORAL_TABLET | ORAL | Status: DC | PRN
  Filled 2017-05-12: qty 1

## 2017-05-12 MED ORDER — BIOTENE DRY MOUTH MT LIQD: 15.0000 mL | OROMUCOSAL | Status: DC | PRN

## 2017-05-12 MED ORDER — LORAZEPAM 1 MG PO TABS: 1.0000 mg | ORAL_TABLET | ORAL | Status: DC | PRN

## 2017-05-12 MED ORDER — SODIUM CHLORIDE 0.9% FLUSH
3.0000 mL | Freq: Two times a day (BID) | INTRAVENOUS | Status: DC
  Administered 2017-05-12 – 2017-05-13 (×2): 3 mL via INTRAVENOUS

## 2017-05-12 MED ORDER — HYDROMORPHONE HCL 1 MG/ML IJ SOLN
0.5000 mg | Freq: Two times a day (BID) | INTRAMUSCULAR | Status: DC
  Administered 2017-05-12 – 2017-05-13 (×2): 0.5 mg via INTRAVENOUS
  Filled 2017-05-12 (×2): qty 0.5

## 2017-05-12 MED ORDER — LORAZEPAM 2 MG/ML IJ SOLN
0.2500 mg | Freq: Two times a day (BID) | INTRAMUSCULAR | Status: DC
  Administered 2017-05-12: 0.25 mg via INTRAVENOUS
  Filled 2017-05-12 (×2): qty 1

## 2017-05-12 MED ORDER — SODIUM CHLORIDE 0.9% FLUSH: 3.0000 mL | INTRAVENOUS | Status: DC | PRN

## 2017-05-12 MED ORDER — GLYCOPYRROLATE 1 MG PO TABS
1.0000 mg | ORAL_TABLET | ORAL | Status: DC | PRN
  Filled 2017-05-12: qty 1

## 2017-05-12 MED ORDER — LORAZEPAM 2 MG/ML PO CONC: 1.0000 mg | ORAL | Status: DC | PRN

## 2017-05-12 MED ORDER — GLYCOPYRROLATE 0.2 MG/ML IJ SOLN
0.2000 mg | INTRAMUSCULAR | Status: DC | PRN
  Filled 2017-05-12: qty 1

## 2017-05-12 MED ORDER — HALOPERIDOL LACTATE 2 MG/ML PO CONC
0.5000 mg | ORAL | Status: DC | PRN
  Filled 2017-05-12: qty 0.3

## 2017-05-12 MED ORDER — ONDANSETRON HCL 4 MG/2ML IJ SOLN: 4.0000 mg | Freq: Four times a day (QID) | INTRAMUSCULAR | Status: DC | PRN

## 2017-05-12 MED ORDER — MORPHINE SULFATE (PF) 2 MG/ML IV SOLN
2.0000 mg | INTRAVENOUS | Status: DC | PRN
  Administered 2017-05-12 (×3): 2 mg via INTRAVENOUS
  Filled 2017-05-12 (×3): qty 1

## 2017-05-12 MED ORDER — LORAZEPAM 2 MG/ML IJ SOLN: 0.2500 mg | Freq: Two times a day (BID) | INTRAMUSCULAR | Status: DC

## 2017-05-12 MED ORDER — ONDANSETRON 4 MG PO TBDP: 4.0000 mg | ORAL_TABLET | Freq: Four times a day (QID) | ORAL | Status: DC | PRN

## 2017-05-12 MED ORDER — LORAZEPAM 2 MG/ML IJ SOLN: 0.5000 mg | Freq: Two times a day (BID) | INTRAMUSCULAR | Status: DC

## 2017-05-12 MED ORDER — HYDROMORPHONE HCL 1 MG/ML IJ SOLN
0.5000 mg | INTRAMUSCULAR | Status: DC | PRN
  Administered 2017-05-12: 0.5 mg via INTRAVENOUS
  Filled 2017-05-12 (×2): qty 1

## 2017-05-12 MED ORDER — HALOPERIDOL LACTATE 5 MG/ML IJ SOLN
0.5000 mg | INTRAMUSCULAR | Status: DC | PRN
  Filled 2017-05-12: qty 0.1

## 2017-05-12 MED ORDER — MORPHINE SULFATE (PF) 2 MG/ML IV SOLN: 2.0000 mg | INTRAVENOUS | Status: DC | PRN

## 2017-05-12 MED ORDER — POLYVINYL ALCOHOL 1.4 % OP SOLN
1.0000 [drp] | Freq: Four times a day (QID) | OPHTHALMIC | Status: DC | PRN
  Filled 2017-05-12: qty 15

## 2017-05-12 MED ORDER — SODIUM CHLORIDE 0.9 % IV SOLN: 250.0000 mL | INTRAVENOUS | Status: DC | PRN

## 2017-05-12 MED ORDER — LORAZEPAM 2 MG/ML IJ SOLN
1.0000 mg | INTRAMUSCULAR | Status: DC | PRN
  Administered 2017-05-13: 1 mg via INTRAVENOUS
  Filled 2017-05-12: qty 1

## 2017-05-12 NOTE — Progress Notes (Signed)
  Patient seen and examined this morning.  Resting comfortably. Minimal NG output overnight.  NG tube placed to clamp for a clamp trial.  If tolerates clamp trial throughout today possible discontinuation of NG later today.  Clayburn Pert, MD [facility]  Day [facility] [phone] Night [facility] [phone]

## 2017-05-12 NOTE — Progress Notes (Signed)
[facility] NAME: Douglas Ramsey    MR#:  [id number]  DATE OF BIRTH:  November 19, 1918  SUBJECTIVE: 83 year old male with history of colon cancer 3 years ago came in for abdominal pain, nausea, vomiting and found to have small bowel obstruction .  Patient NG tube is clamped since morning.  Less bilious drainage since last night.  But patient appears uncomfortable and asking for more pain medicine and also states that''' LET ME GO"'  CHIEF COMPLAINT:   Chief Complaint  Patient presents with  . Abdominal Pain    REVIEW OF SYSTEMS:   Review of Systems  Constitutional: Negative for chills and fever.  HENT: Positive for hearing loss.   Eyes: Negative for blurred vision, double vision and photophobia.  Respiratory: Negative for cough, hemoptysis and shortness of breath.   Cardiovascular: Negative for palpitations, orthopnea and leg swelling.  Gastrointestinal: Negative for abdominal pain, diarrhea and vomiting.  Genitourinary: Negative for dysuria and urgency.  Musculoskeletal: Negative for myalgias and neck pain.  Skin: Negative for rash.  Neurological: Negative for dizziness, focal weakness, seizures, weakness and headaches.  Psychiatric/Behavioral: Negative for memory loss. The patient does not have insomnia.     No Known Allergies  VITALS:  Blood pressure (!) 100/59, pulse 95, temperature 98.2 F (36.8 C), temperature source Oral, resp. rate 20, height 5\' 11"  (1.803 m), weight 73.1 kg (161 lb 3.2 oz), SpO2 93 %.  PHYSICAL EXAMINATION:  GENERAL:  83 y.o.-year-old patient lying in the bed with no acute distress.  EYES: Pupils equal, round, reactive to light . No scleral icterus. Extraocular muscles intact.  HEENT: Head atraumatic, normocephalic. Oropharynx and nasopharynx clear.  NG-tube in place, draining copious amount of bilious fluid, NG tube to LIS. NECK:  Supple, no jugular venous distention. No thyroid enlargement, no  tenderness.  LUNGS: Normal breath sounds bilaterally, no wheezing, rales,rhonchi or crepitation. No use of accessory muscles of respiration.  CARDIOVASCULAR: S1, S2 normal. No murmurs, rubs, or gallops.  ABDOMEN: Soft, nontender, nondistended. Bowel sounds present. No organomegaly or mass.  EXTREMITIES: No pedal edema, cyanosis, or clubbing.  NEUROLOGIC: Cranial nerves II through XII are intact. Muscle strength 5/5 in all extremities. Sensation intact. Gait not checked.  PSYCHIATRIC: The patient is alert and oriented x 3.  SKIN: No obvious rash, lesion, or ulcer.    LABORATORY PANEL:   CBC Recent Labs  Lab 05/11/17 0321  WBC 23.6*  HGB 15.1  HCT 46.5  PLT 234   ------------------------------------------------------------------------------------------------------------------  Chemistries  Recent Labs  Lab 05/10/17 1943 05/11/17 0321  NA 137 137  K 3.9 4.5  CL 100* 101  CO2 27 29  GLUCOSE 121* 152*  BUN 16 17  CREATININE 1.16 1.17  CALCIUM 9.2 9.0  AST 34  --   ALT 13*  --   ALKPHOS 75  --   BILITOT 0.9  --    ------------------------------------------------------------------------------------------------------------------  Cardiac Enzymes No results for input(s): TROPONINI in the last 168 hours. ------------------------------------------------------------------------------------------------------------------  RADIOLOGY:  Dg Abdomen 1 View  Result Date: 05/10/2017 CLINICAL DATA:  83 year old male with NG tube placement. EXAM: ABDOMEN - 1 VIEW COMPARISON:  Abdominal CT dated 05/10/2017 FINDINGS: An enteric tube is partially visualized with tip in the right lower hemiabdomen likely in the distal stomach. Dilated loops of small bowel in the left lower abdomen measure up to 3.8 cm. Excreted contrast in the renal collecting systems noted. Bilateral lower lung field nodularity and densities in keeping  with known metastatic disease. IMPRESSION: 1. Enteric tube with tip  likely in the distal stomach. 2. Dilated small-bowel loops in the left lower quadrant. 3. Bibasilar pulmonary densities/metastatic disease. Electronically Signed   By: Anner Crete M.D.   On: 05/10/2017 23:32   Ct Abdomen Pelvis W Contrast  Result Date: 05/10/2017 CLINICAL DATA:  Acute onset of umbilical abdominal pain. EXAM: CT ABDOMEN AND PELVIS WITH CONTRAST TECHNIQUE: Multidetector CT imaging of the abdomen and pelvis was performed using the standard protocol following bolus administration of intravenous contrast. CONTRAST:  25mL ISOVUE-370 IOPAMIDOL (ISOVUE-370) INJECTION 76% COMPARISON:  CT of the abdomen and pelvis from 08/02/2014, and CT of the chest performed 07/27/2015 FINDINGS: Lower chest: Innumerable nodules are noted scattered throughout both lung bases, compatible with metastatic disease to the lungs. Mild underlying emphysema is noted. Scattered coronary artery calcifications are seen. Hepatobiliary: Hypodensities within the liver measure up to 1.8 cm in size. These appear relatively stable from 2016 and likely reflect cysts. Stones are noted within the gallbladder. The gallbladder is partially decompressed and otherwise unremarkable. The common bile duct remains borderline normal in caliber for the patient's age. Pancreas: The pancreas is within normal limits. There appears to be two small 3 mm stones at the pancreatic duct at the level of the pancreatic head, without evidence of dilatation of the pancreatic duct. Spleen: The spleen is unremarkable in appearance. Adrenals/Urinary Tract: The adrenal glands are unremarkable in appearance. Scattered bilateral renal cysts are noted bilaterally, more prominent on the left. There is no evidence of hydronephrosis. No renal or ureteral stones are seen. There is no evidence of hydronephrosis. Mild nonspecific perinephric stranding is noted bilaterally. Stomach/Bowel: There is distention of small-bowel loops to 3.4 cm in maximal diameter, with  gradual fecalization at the mid ileum at the right lower quadrant. Trace associated free fluid and mesenteric inflammation are seen. The transition point appears just proximal to the herniated segment of small bowel at the small to moderate right inguinal hernia. A bowel suture line is noted at the herniated segment. Residual air is noted within the more distal small bowel, suggesting this reflects small bowel dysmotility rather than obstruction. Additional bowel suture lines are seen along the ascending colon and sigmoid colon. The colon is partially filled with stool and grossly unremarkable in appearance. The stomach is partially filled with fluid and grossly unremarkable. Vascular/Lymphatic: There is aneurysmal dilatation of the infrarenal abdominal aorta to 7.1 cm in transverse dimension and 6.3 cm in AP dimension, with mild associated thrombosis in the aneurysm sac. Scattered calcification is seen along the abdominal aorta and its branches. Aneurysmal dilatation resolves at the level of the aortic bifurcation. No retroperitoneal or pelvic sidewall lymphadenopathy is seen. Right mid abdominal mesenteric nodes measure up to 1.4 cm in short axis, raising concern for metastatic disease from the patient's known colon cancer. Reproductive: The bladder is mildly distended and grossly unremarkable. The prostate is mildly enlarged, measuring 5.1 cm in transverse dimension. Other: A small relatively broad-based anterior abdominal wall hernia is noted superior and to the left of the umbilicus. This contains only fat. Musculoskeletal: No acute osseous abnormalities are identified. There is mild grade 1 retrolisthesis of L2 on L3, and mild grade 1 anterolisthesis of L4 on L5, reflecting underlying facet disease. The visualized musculature is unremarkable in appearance. IMPRESSION: 1. Distention of small-bowel loops to 3.4 cm in maximal diameter, with gradual fecalization at the mid abdomen at the right lower quadrant.  Trace associated free fluid and mesenteric inflammation  seen. The transition point is just proximal to the herniated segment of small bowel at the right inguinal hernia. Residual air noted within distal small bowel, suggesting that this reflects small bowel dysmotility and possibly ileitis, rather than small bowel obstruction. 2. Herniation of a short segment of distal ileum into a small to moderate right inguinal hernia, with associated bowel suture line. No definite evidence for obstruction at this location. 3. Small left periumbilical hernia contains only fat, and is unremarkable in appearance. 4. Aneurysmal dilatation of the infrarenal abdominal aorta to 7.1 cm in transverse dimension and 6.3 cm in AP dimension, with mild associated thrombosis in the aneurysm sac. This has increased significantly in size from 5.5 cm and 4.9 cm, respectively, in 2016. Vascular surgery consultation recommended due to increased risk of rupture for AAA > 5.5 cm, if deemed clinically appropriate. This recommendation follows ACR consensus guidelines: White Paper of the ACR Incidental Findings Committee II on Vascular Findings. J Am Coll Radiol 2013; 10:789-794. 5. Innumerable nodules scattered throughout both lung bases, compatible with metastatic disease to the lungs. This is new from 2017. 6. Prominent mesenteric nodes at the right mid abdomen, measuring up to 1.4 cm in short axis, likely reflecting metastatic disease from the patient's known colon cancer. 7. Cholelithiasis.  Gallbladder otherwise grossly unremarkable. 8. Two small 3 mm stones at the pancreatic duct at the level of the pancreatic head, without evidence for pancreatic duct dilatation. 9. Stable appearing hepatic and renal cysts noted. 10. Mildly enlarged prostate. Aortic Atherosclerosis (ICD10-I70.0). Electronically Signed   By: Garald Balding M.D.   On: 05/10/2017 21:46    EKG:   Orders placed or performed during the hospital encounter of 05/10/17  . EKG  12-Lead  . EKG 12-Lead    ASSESSMENT AND PLAN:   #1 abdominal pain secondary to small bowel obstruction, ' NG tube to suctioning is done, less bilious drainage from NG tube today, NG tube is clamped as per surgery.  Spoke with Dr. Sanda Klein, he is going to follow-up with the evening and see if there is no drainage coming out of her NG tube no need for x-ray of her abdomen.  Patient has metastatic colon cancer, seen by Dr. Grayland Ormond from oncology, prognosis is fairly poor, family is making sure that he is comfortable, continue morphine for pain control, family  is talking about hospice home.    History of CAD, history of unstable angina.:  Patient is on aspirin, statins, 3.  History of COPD on oxygen 2 L, FOLLOWS up with pulmonary Dr. Ashby Dawes. 4.  For COPD patient is on Anoro Ellipta, Ventolin. Discussed with patient, patient's daughter at bedside. All the records are reviewed and case discussed with Care Management/Social Workerr. Management plans discussed with the patient, family and they are in agreement.  CODE STATUS: dnr TOTAL TIME TAKING CARE OF THIS PATIENT: 35 minutes.   POSSIBLE D/C IN 1-2 DAYS, DEPENDING ON CLINICAL CONDITION.   Epifanio Lesches M.D on 05/12/2017 at 3:17 PM  Between 7am to 6pm - Pager - [phone]  After 6pm go to [email or web address] - password [email or web address] [facility]  [facility]  Office  [phone]  CC: Primary care physician; Leone Haven, MD   Note: This dictation was prepared with Dragon dictation along with smaller phrase technology. Any transcriptional errors that result from this process are unintentional.

## 2017-05-12 NOTE — Progress Notes (Signed)
  Visited with patient this afternoon.  NG tube is been removed prior to my revisiting.  Patient has decided to proceed with hospice.  No plans or indications for further surgical intervention at this time.  Please call if general surgery can be of any further assistance with this patient's care.  Clayburn Pert, MD [facility]  Day [facility] [phone] Night [facility] [phone]

## 2017-05-12 NOTE — Care Management (Signed)
Received call from patient nurse that daughter, [name] wants to meet to discuss discharge options.  Met with daughter [name] regarding discharge planning. Discussed options for hospice and different agencies.  Daughter states she is interested in taking him to the hospice home. She would like [facility] and would like to speak with Santiago Glad from [facility] further. Referral sent o Santiago Glad with hospice to speak with daughter regarding hospice options.

## 2017-05-12 NOTE — Progress Notes (Signed)
New hospice home referral received from [facility] Lisa Jacobs following Palliative Medicine consult. Patient is a 83 year old man with a known history of colon cancer, COPD, AAA, CAD s/p stent and CKD who presented to the [facility] ED on 12/25 with severe abdominal pain and nausea. Abdominal CT revealed a small bowel obstruction, thought to be metastatic disease, patient and family chose conservative management. Surgery was consulted, but due to the patient's comorbidities he was not a surgical candidate.  Palliative medicine was consulted for symptom management and goals. Patient has chosen to focus on comfort and pain management with decision to discharge to the hospice home.  °Writer met in the room with patient, his daughter Douglas Ramsey and wife Douglas Ramsey to initiate education regarding hospice services, philosophy and team approach to care with good understanding voiced. Questions answered, patient requested that his daughter Douglas Ramsey sign consents. Patient information faxed to referral. Plan is for transport via EMS to the hospice home tomorrow. Hospital care team updated. Writer to follow up in the morning,  °Karen Robertson RN, BSN, CHPN °[facility], hospital Liaison °[phone] °

## 2017-05-12 NOTE — Progress Notes (Addendum)
Daily Progress Note   Patient Name: Douglas Ramsey       Date: 05/12/2017 DOB: 05/21/1918  Age: 83 y.o. MRN#: [id number] Attending Physician: Epifanio Lesches, MD Primary Care Physician: Leone Haven, MD Admit Date: 05/10/2017  Reason for Consultation/Follow-up: Establishing goals of care and symptom management  Subjective: Patient frustrated and complaining of increased pain.  "It feels like they are stuffing a balloon into my stomach and it wont fit"  Pain is a 6.    Patient states "Lets get this show on the road".    Patient stated very clearly - "between the hernia getting bigger and the cancer getting bigger there is no more room in my gut to become unblocked.  I'm ready get this tube out of my nose and go to [facility].  The longer I'm here, the longer I'm in pain".  Talked with bedside RN, dtr, wife and Hospice liasion at bedside.     Assessment: Blockage appears unchanged.  No bowel sounds, pain increased since this am.   Patient Profile/HPI:  83 y.o. male  with past medical history of colon cancer s/p surgery 3 yrs ago, AAA, CAD s/p stent, CKD, COPD who was admitted on 05/10/2017 with SBO. Imaging showed widely metastatic cancer.  An N/G was placed and the patient elects conservative measures.  Length of Stay: 1  Current Medications: Scheduled Meds:  . aspirin  81 mg Oral Daily  . docusate sodium  100 mg Oral BID  . heparin  5,000 Units Subcutaneous Q8H  . ipratropium-albuterol  3 mL Nebulization TID  . isosorbide mononitrate  30 mg Oral Daily  . umeclidinium-vilanterol  1 puff Inhalation Daily  . ascorbic acid  500 mg Oral Daily    Continuous Infusions: . sodium chloride 75 mL/hr at 05/12/17 0532    PRN Meds: acetaminophen **OR** acetaminophen,  bisacodyl, morphine injection, morphine CONCENTRATE, nitroGLYCERIN, ondansetron **OR** ondansetron (ZOFRAN) IV, phenol, traZODone  Physical Exam       Thin well developed elderly male, A&O, mild - mod distress cv rrr resp no distress Abdomen distended soft, no bowel sounds heard  Vital Signs: BP (!) 100/59 (BP Location: Right Arm)   Pulse 95   Temp 98.2 F (36.8 C) (Oral)   Resp 20   Ht 5\' 11"  (1.803 m)   Wt 73.1  kg (161 lb 3.2 oz)   SpO2 93%   BMI 22.48 kg/m  SpO2: SpO2: 93 % O2 Device: O2 Device: Nasal Cannula O2 Flow Rate: O2 Flow Rate (L/min): 2 L/min  Intake/output summary:   Intake/Output Summary (Last 24 hours) at 05/12/2017 1527 Last data filed at 05/12/2017 4580 Gross per 24 hour  Intake 1321 ml  Output 100 ml  Net 1221 ml   LBM: Last BM Date: 05/10/17 Baseline Weight: Weight: 68 kg (150 lb) Most recent weight: Weight: 73.1 kg (161 lb 3.2 oz)       Palliative Assessment/Data: 40%%      Patient Active Problem List   Diagnosis Date Noted  . SBO (small bowel obstruction) ([place]) 05/11/2017  . Metastatic cancer ([place])   . Palliative care encounter   . Encounter for hospice care discussion   . Ileus, unspecified ([place])   . Nocturnal leg cramps 10/19/2016  . OAB (overactive bladder) 04/20/2016  . Pleural plaque 12/02/2015  . AAA (abdominal aortic aneurysm) without rupture ([place]) 10/23/2015  . CKD (chronic kidney disease) stage 3, GFR 30-59 ml/min ([facility]) 10/10/2015  . Vasomotor rhinitis 10/10/2015  . Incisional hernia 10/10/2015  . Right inguinal hernia 10/10/2015  . Malignant neoplasm of sigmoid colon ([place]) 11/19/2014  . MI (mitral incompetence) 09/09/2014  . Beat, premature ventricular 06/17/2014  . CAD (coronary artery disease) 05/09/2014  . Acid reflux 05/09/2014  . Mixed hyperlipidemia 05/09/2014  . Moderate COPD (chronic obstructive pulmonary disease) ([place]) 12/07/2013    Palliative Care Plan    Recommendations/Plan:  Patient with bowel  obstruction.  Does not want further treatment.  He has shifted to full comfort.  Change orders to provide comfort only.  D/C interventions that do not contribute to comfort.  Transfer to [facility] when bed is available.  GOAL IS TO DECREASE PAIN.  PROVIDE COMFORT.  Goals of Care and Additional Recommendations:  Limitations on Scope of Treatment: Full Comfort Care  Code Status:  DNR  Prognosis:   < 2 weeks unable to take any PO.  Needs continuous pain medication due to bowel obstruction.   Discharge Planning:  Hospice facility  Care plan was discussed with Surgery, RN, family, patient.  Thank you for allowing the Palliative Medicine Team to assist in the care of this patient.  Total time spent:  35 min.     Greater than 50%  of this time was spent counseling and coordinating care related to the above assessment and plan.  Florentina Jenny, PA-C Palliative Medicine  Please contact Palliative MedicineTeam phone at [phone] for questions and concerns between 7 am - 7 pm.   Please see AMION for individual provider pager numbers.

## 2017-05-12 NOTE — Clinical Social Work Note (Signed)
CSW received consult that patient's family would like [facility], CSW spoke to hospice house and they can accept patient on Friday.  CSW to continue to follow patient's progress throughout discharge planning.  Jones Broom. Norval Morton, MSW, [place]  05/12/2017 6:41 PM

## 2017-05-12 NOTE — Progress Notes (Signed)
[place] responded to an OR for prayer. Pt is alert, awake, and in discomfort. Wife and daughter are beside. Pt will transfer to the hospice house on 05/13/17. Pt spoke at length about a bad experience he had several years ago involving his first wife and hospice. He hopes his experience will be better. Pt also spoke of his early years and the good influence his father had on him. Pt shared many stories and [place] encouraged him to find a way to record his stories. They are most interesting. CH offered prayer for a good experience on this next stage of his journey.    05/12/17 1700  Clinical Encounter Type  Visited With Patient;Patient and family together;Health care provider  Visit Type Initial;Spiritual support  Referral From Nurse  Consult/Referral To Chaplain  Spiritual Encounters  Spiritual Needs Prayer

## 2017-05-12 NOTE — Consult Note (Signed)
Note written  [facility] Inpatient Consult   05/12/2017  Douglas Ramsey 1918/09/18 [id number]   Patient screened for potential [place] Management services. Patient is on the [facility] registry as a benefit of their [employer] . Electronic medical record reveals patient's discharge plan is Hospice care. [facility] Care Management services not appropriate at this time. If patient's post hospital needs change please place a [facility] Care Management consult. For questions please contact:   Anchor Dwan RN, [place] [facility] Liaison  [phone]) Business Mobile [phone]) Toll free office

## 2017-05-13 DIAGNOSIS — Z9189 Other specified personal risk factors, not elsewhere classified: Secondary | ICD-10-CM

## 2017-05-13 MED ORDER — LORAZEPAM 2 MG/ML PO CONC: 1.0000 mg | ORAL | 0 refills | Status: AC | PRN

## 2017-05-13 MED ORDER — MORPHINE SULFATE (CONCENTRATE) 10 MG/0.5ML PO SOLN: 5.0000 mg | ORAL | 0 refills | Status: AC | PRN

## 2017-05-13 MED ORDER — LORAZEPAM 2 MG/ML IJ SOLN: 0.2500 mg | Freq: Two times a day (BID) | INTRAMUSCULAR | Status: DC

## 2017-05-13 MED ORDER — HALOPERIDOL LACTATE 2 MG/ML PO CONC: 0.5000 mg | ORAL | 0 refills | Status: AC | PRN

## 2017-05-13 MED ORDER — LORAZEPAM 2 MG/ML IJ SOLN
0.2500 mg | Freq: Once | INTRAMUSCULAR | Status: AC
  Administered 2017-05-13: 0.25 mg via INTRAVENOUS
  Filled 2017-05-13: qty 1

## 2017-05-13 NOTE — Progress Notes (Signed)
Discharge teaching given to patient family. Patient IV was left in per hospice request. Patient will be transported to hospice home via EMS. All patient belongings gathered prior to leaving.

## 2017-05-13 NOTE — Clinical Social Work Note (Signed)
Patient to be d/c'ed today to [facility].  Patient and family agreeable to plans will transport via ems RN to call report.  Evette Cristal, MSW, [place]

## 2017-05-13 NOTE — Care Management Important Message (Signed)
Important Message  Patient Details  Name: Douglas Ramsey MRN: [id number] Date of Birth: 1918/09/21   Medicare Important Message Given:  N/A - LOS <3 / Initial given by admissions    Beverly Sessions, RN 05/13/2017, 10:56 AM

## 2017-05-13 NOTE — Discharge Summary (Signed)
Douglas Ramsey, is a 83 y.o. male  DOB 1918-08-12  MRN [id number].  Admission date:  05/10/2017  Admitting Physician  Max Sane, MD  Discharge Date:  05/13/2017   Primary MD  Leone Haven, MD  Recommendations for primary care physician for things to follow:  Patient being discharged to hospice home today.   Admission Diagnosis  Generalized abdominal pain [R10.84] Other partial intestinal obstruction ([place]) [K56.690]   Discharge Diagnosis  Generalized abdominal pain [R10.84] Other partial intestinal obstruction ([place]) [K56.690]    Active Problems:   SBO (small bowel obstruction) ([place])   Metastatic cancer [facility])   Palliative care encounter   Encounter for hospice care discussion   Generalized abdominal pain      Past Medical History:  Diagnosis Date  . Anemia   . CAD (coronary artery disease)    s/p MI and PCI  . CKD (chronic kidney disease)   . Colon cancer ([place])   . COPD (chronic obstructive pulmonary disease) ([place])   . Hyperlipidemia   . Rhinitis     Past Surgical History:  Procedure Laterality Date  . COLON SURGERY     ostomy (reversed in december)  . COLONOSCOPY  05/13/2004  . CORONARY ANGIOPLASTY  02/2007  . ESOPHAGOGASTRODUODENOSCOPY  05/11/2014  . FLEXIBLE SIGMOIDOSCOPY  05/14/2014       History of present illness and  Hospital Course:     Kindly see H&P for history of present illness and admission details, please review complete Labs, Consult reports and Test reports for all details in brief  HPI  from the history and physical done on the day of admission  83 year old man with history of colon cancer, COPD, AAA, CAD status post stent, admitted on December 25 because of severe abdominal pain, nausea.  CAT scan of the abdomen in the emergency room showed small bowel obstruction and he  is admitted for the same.  Hospital Course  #1 abdominal pain, nausea secondary to small bowel obstruction: Patient has colon cancer with metastatic disease to lungs.  Patient received surgical consult, inserted NG tube to suctioning, patient had a lot of bilious drainage from NG tube suctioning.  Dr. Liam Graham followed the patient and initial plan was to clamp the NG tube and see how he does.  But because of comorbid conditions and being not surgical candidate family chose symptom management and comfort care.  And patient being evaluated by palliative care team and also hospice and daughter Douglas Ramsey and wife Douglas Ramsey chose to focus on comfort and pain management and decision  made to move him to hospice home today.  Patient will go to hospice home if the arrangements are made.  Patient will be on Ativan, Robinul, Haldol for comfort care.,    Discharge Condition: Stable   Follow UP    Discharge Instructions  and  Discharge Medications      Allergies as of 05/13/2017   No Known Allergies     Medication List    STOP taking these medications   ANORO ELLIPTA 62.5-25 MCG/INH Aepb Generic drug:  umeclidinium-vilanterol   ascorbic acid 100 MG tablet Commonly known as:  VITAMIN C   aspirin 81 MG tablet   CALCIUM 600 PO   ferrous sulfate 325 (65 FE) MG tablet   glucosamine-chondroitin 500-400 MG tablet   ipratropium-albuterol 0.5-2.5 (3) MG/3ML Soln Commonly known as:  DUONEB   isosorbide mononitrate 30 MG 24 hr tablet Commonly known as:  IMDUR   magnesium (amino acid chelate)  133 MG tablet   MAGNESIUM-ALUMINUM PO   nitroGLYCERIN 0.4 MG SL tablet Commonly known as:  NITROSTAT   NONFORMULARY OR COMPOUNDED ITEM   Potassium 95 MG Tabs   simvastatin 20 MG tablet Commonly known as:  ZOCOR   VENTOLIN HFA 108 (90 Base) MCG/ACT inhaler Generic drug:  albuterol   VITAMIN B 12 PO   VITAMIN D (CHOLECALCIFEROL) PO     TAKE these medications   haloperidol 2 MG/ML  solution Commonly known as:  HALDOL Place 0.3 mLs (0.6 mg total) under the tongue every 4 (four) hours as needed for agitation (or delirium).   LORazepam 2 MG/ML concentrated solution Commonly known as:  ATIVAN Place 0.5 mLs (1 mg total) under the tongue every 4 (four) hours as needed for anxiety.   morphine CONCENTRATE 10 MG/0.5ML Soln concentrated solution Take 0.25 mLs (5 mg total) by mouth every 2 (two) hours as needed for moderate pain or shortness of breath.         Diet and Activity recommendation: See Discharge Instructions above   Consults obtained -surgery, oncology, palliative care, hospice   Major procedures and Radiology Reports - PLEASE review detailed and final reports for all details, in brief -      Dg Abdomen 1 View  Result Date: 05/10/2017 CLINICAL DATA:  83 year old male with NG tube placement. EXAM: ABDOMEN - 1 VIEW COMPARISON:  Abdominal CT dated 05/10/2017 FINDINGS: An enteric tube is partially visualized with tip in the right lower hemiabdomen likely in the distal stomach. Dilated loops of small bowel in the left lower abdomen measure up to 3.8 cm. Excreted contrast in the renal collecting systems noted. Bilateral lower lung field nodularity and densities in keeping with known metastatic disease. IMPRESSION: 1. Enteric tube with tip likely in the distal stomach. 2. Dilated small-bowel loops in the left lower quadrant. 3. Bibasilar pulmonary densities/metastatic disease. Electronically Signed   By: Anner Crete M.D.   On: 05/10/2017 23:32   Ct Abdomen Pelvis W Contrast  Result Date: 05/10/2017 CLINICAL DATA:  Acute onset of umbilical abdominal pain. EXAM: CT ABDOMEN AND PELVIS WITH CONTRAST TECHNIQUE: Multidetector CT imaging of the abdomen and pelvis was performed using the standard protocol following bolus administration of intravenous contrast. CONTRAST:  48mL ISOVUE-370 IOPAMIDOL (ISOVUE-370) INJECTION 76% COMPARISON:  CT of the abdomen and pelvis  from 08/02/2014, and CT of the chest performed 07/27/2015 FINDINGS: Lower chest: Innumerable nodules are noted scattered throughout both lung bases, compatible with metastatic disease to the lungs. Mild underlying emphysema is noted. Scattered coronary artery calcifications are seen. Hepatobiliary: Hypodensities within the liver measure up to 1.8 cm in size. These appear relatively stable from 2016 and likely reflect cysts. Stones are noted within the gallbladder. The gallbladder is partially decompressed and otherwise unremarkable. The common bile duct remains borderline normal in caliber for the patient's age. Pancreas: The pancreas is within normal limits. There appears to be two small 3 mm stones at the pancreatic duct at the level of the pancreatic head, without evidence of dilatation of the pancreatic duct. Spleen: The spleen is unremarkable in appearance. Adrenals/Urinary Tract: The adrenal glands are unremarkable in appearance. Scattered bilateral renal cysts are noted bilaterally, more prominent on the left. There is no evidence of hydronephrosis. No renal or ureteral stones are seen. There is no evidence of hydronephrosis. Mild nonspecific perinephric stranding is noted bilaterally. Stomach/Bowel: There is distention of small-bowel loops to 3.4 cm in maximal diameter, with gradual fecalization at the mid ileum at the  right lower quadrant. Trace associated free fluid and mesenteric inflammation are seen. The transition point appears just proximal to the herniated segment of small bowel at the small to moderate right inguinal hernia. A bowel suture line is noted at the herniated segment. Residual air is noted within the more distal small bowel, suggesting this reflects small bowel dysmotility rather than obstruction. Additional bowel suture lines are seen along the ascending colon and sigmoid colon. The colon is partially filled with stool and grossly unremarkable in appearance. The stomach is partially  filled with fluid and grossly unremarkable. Vascular/Lymphatic: There is aneurysmal dilatation of the infrarenal abdominal aorta to 7.1 cm in transverse dimension and 6.3 cm in AP dimension, with mild associated thrombosis in the aneurysm sac. Scattered calcification is seen along the abdominal aorta and its branches. Aneurysmal dilatation resolves at the level of the aortic bifurcation. No retroperitoneal or pelvic sidewall lymphadenopathy is seen. Right mid abdominal mesenteric nodes measure up to 1.4 cm in short axis, raising concern for metastatic disease from the patient's known colon cancer. Reproductive: The bladder is mildly distended and grossly unremarkable. The prostate is mildly enlarged, measuring 5.1 cm in transverse dimension. Other: A small relatively broad-based anterior abdominal wall hernia is noted superior and to the left of the umbilicus. This contains only fat. Musculoskeletal: No acute osseous abnormalities are identified. There is mild grade 1 retrolisthesis of L2 on L3, and mild grade 1 anterolisthesis of L4 on L5, reflecting underlying facet disease. The visualized musculature is unremarkable in appearance. IMPRESSION: 1. Distention of small-bowel loops to 3.4 cm in maximal diameter, with gradual fecalization at the mid abdomen at the right lower quadrant. Trace associated free fluid and mesenteric inflammation seen. The transition point is just proximal to the herniated segment of small bowel at the right inguinal hernia. Residual air noted within distal small bowel, suggesting that this reflects small bowel dysmotility and possibly ileitis, rather than small bowel obstruction. 2. Herniation of a short segment of distal ileum into a small to moderate right inguinal hernia, with associated bowel suture line. No definite evidence for obstruction at this location. 3. Small left periumbilical hernia contains only fat, and is unremarkable in appearance. 4. Aneurysmal dilatation of the  infrarenal abdominal aorta to 7.1 cm in transverse dimension and 6.3 cm in AP dimension, with mild associated thrombosis in the aneurysm sac. This has increased significantly in size from 5.5 cm and 4.9 cm, respectively, in 2016. Vascular surgery consultation recommended due to increased risk of rupture for AAA > 5.5 cm, if deemed clinically appropriate. This recommendation follows ACR consensus guidelines: White Paper of the ACR Incidental Findings Committee II on Vascular Findings. J Am Coll Radiol 2013; 10:789-794. 5. Innumerable nodules scattered throughout both lung bases, compatible with metastatic disease to the lungs. This is new from 2017. 6. Prominent mesenteric nodes at the right mid abdomen, measuring up to 1.4 cm in short axis, likely reflecting metastatic disease from the patient's known colon cancer. 7. Cholelithiasis.  Gallbladder otherwise grossly unremarkable. 8. Two small 3 mm stones at the pancreatic duct at the level of the pancreatic head, without evidence for pancreatic duct dilatation. 9. Stable appearing hepatic and renal cysts noted. 10. Mildly enlarged prostate. Aortic Atherosclerosis (ICD10-I70.0). Electronically Signed   By: Garald Balding M.D.   On: 05/10/2017 21:46    Micro Results     No results found for this or any previous visit (from the past 240 hour(s)).     Today   Subjective:  Kelle Darting today he is stable for discharge to hospice home. Objective:   Blood pressure 113/80, pulse 99, temperature 98.1 F (36.7 C), temperature source Oral, resp. rate 20, height 5\' 11"  (1.803 m), weight 73.1 kg (161 lb 3.2 oz), SpO2 92 %.   Intake/Output Summary (Last 24 hours) at 05/13/2017 0812 Last data filed at 05/13/2017 0440 Gross per 24 hour  Intake 1609 ml  Output 0 ml  Net 1609 ml    Exam  Supple Neck,No JVD, No cervical lymphadenopathy appriciated.  Symmetrical Chest wall movement, decreased air entry at the bases. RRR,No Gallops,Rubs or new Murmurs,  No Parasternal Heave +ve B.Sounds, Abd distended., No organomegaly appriciated, No rebound -guarding or rigidity. No Cyanosis, Clubbing or edema, No new Rash or bruise  Data Review   CBC w Diff:  Lab Results  Component Value Date   WBC 23.6 (H) 05/11/2017   HGB 15.1 05/11/2017   HGB 9.4 (L) 08/07/2014   HCT 46.5 05/11/2017   HCT 30.2 (L) 08/07/2014   PLT 234 05/11/2017   PLT 272 08/10/2014   LYMPHOPCT 9 08/05/2015   LYMPHOPCT 16.0 06/04/2014   MONOPCT 12 08/05/2015   MONOPCT 8 08/07/2014   MONOPCT 13.6 06/04/2014   EOSPCT 1 08/05/2015   EOSPCT 2.7 06/04/2014   BASOPCT 0 08/05/2015   BASOPCT 1.4 06/04/2014    CMP:  Lab Results  Component Value Date   NA 137 05/11/2017   NA 143 08/12/2014   K 4.5 05/11/2017   K 4.2 08/12/2014   CL 101 05/11/2017   CL 101 08/12/2014   CO2 29 05/11/2017   CO2 38 (H) 08/12/2014   BUN 17 05/11/2017   BUN 18 08/12/2014   CREATININE 1.17 05/11/2017   CREATININE 0.82 08/12/2014   PROT 7.3 05/10/2017   PROT 5.5 (L) 05/21/2014   ALBUMIN 3.7 05/10/2017   ALBUMIN 2.1 (L) 05/21/2014   BILITOT 0.9 05/10/2017   BILITOT 0.6 05/21/2014   ALKPHOS 75 05/10/2017   ALKPHOS 66 05/21/2014   AST 34 05/10/2017   AST 34 05/21/2014   ALT 13 (L) 05/10/2017   ALT 22 05/21/2014  .   Total Time in preparing paper work, data evaluation and todays exam - 35 minutes  Epifanio Lesches M.D on 05/13/2017 at 8:12 AM    Note: This dictation was prepared with Dragon dictation along with smaller phrase technology. Any transcriptional errors that result from this process are unintentional.

## 2017-05-13 NOTE — Progress Notes (Signed)
Nutrition Brief Note  Chart reviewed. Pt now transitioning to comfort care.  No further nutrition interventions warranted at this time.  Please re-consult as needed.   Doylene Splinter MS, RD, LDN Pager #- [phone] After Hours Pager: [phone]    

## 2017-05-13 NOTE — Progress Notes (Signed)
Daily Progress Note   Patient Name: Douglas Ramsey       Date: 05/13/2017 DOB: Mar 16, 1919  Age: 83 y.o. MRN#: [id number] Attending Physician: Epifanio Lesches, MD Primary Care Physician: Leone Haven, MD Admit Date: 05/10/2017  Reason for Consultation/Follow-up: Establishing goals of care, Pain control and Psychosocial/spiritual support  Subjective: Patient awake, melancholy.  Talking about the cancer.  Talking about whether or not he is living up to [facility] expectations.  I attempted to redirect his thinking away from the cancer - to think about things he enjoys and loves, but in his confusion he remains focused on the cancer.  He is accepting of going to hospice house today.  Appears much more comfortable than he did last night.   Assessment: Metastatic cancer, hernia, SBO - SBO did not respond to 2 days of conservative measures - patient opted to stop treatment and focus on full comfort.   Patient Profile/HPI:  83 y.o. male  with past medical history of colon cancer s/p surgery 3 yrs ago, AAA, CAD s/p stent, CKD, COPD who was admitted on 05/10/2017 with SBO. Imaging showed widely metastatic cancer.  An N/G was placed and the patient elects conservative measures.     Length of Stay: 2  Current Medications: Scheduled Meds:  .  HYDROmorphone (DILAUDID) injection  0.5 mg Intravenous BID  . ipratropium-albuterol  3 mL Nebulization TID  . LORazepam  0.25 mg Intravenous BID  . sodium chloride flush  3 mL Intravenous Q12H  . umeclidinium-vilanterol  1 puff Inhalation Daily    Continuous Infusions: . sodium chloride 75 mL/hr at 05/12/17 2051  . sodium chloride      PRN Meds: sodium chloride, acetaminophen **OR** acetaminophen, antiseptic oral rinse, glycopyrrolate **OR**  glycopyrrolate **OR** glycopyrrolate, haloperidol **OR** haloperidol **OR** haloperidol lactate, HYDROmorphone (DILAUDID) injection, LORazepam **OR** LORazepam **OR** LORazepam, morphine CONCENTRATE, nitroGLYCERIN, ondansetron **OR** ondansetron (ZOFRAN) IV, phenol, polyvinyl alcohol, sodium chloride flush  Physical Exam        Well developed very pleasant electrician, appears comfortable, conversant but confused CV rrr resp no distress Abdomen softer, multiple masses   Vital Signs: BP 113/80 (BP Location: Right Arm)   Pulse 99   Temp 98.1 F (36.7 C) (Oral)   Resp 20   Ht 5\' 11"  (1.803 m)   Wt 73.1 kg (161 lb  3.2 oz)   SpO2 92%   BMI 22.48 kg/m  SpO2: SpO2: 92 % O2 Device: O2 Device: Nasal Cannula O2 Flow Rate: O2 Flow Rate (L/min): 2 L/min  Intake/output summary:   Intake/Output Summary (Last 24 hours) at 05/13/2017 1610 Last data filed at 05/13/2017 0440 Gross per 24 hour  Intake 1232 ml  Output 0 ml  Net 1232 ml   LBM: Last BM Date: 05/10/17 Baseline Weight: Weight: 68 kg (150 lb) Most recent weight: Weight: 73.1 kg (161 lb 3.2 oz)       Palliative Assessment/Data: 20%      Patient Active Problem List   Diagnosis Date Noted  . Generalized abdominal pain   . SBO (small bowel obstruction) ([place]) 05/11/2017  . Metastatic cancer ([place])   . Palliative care encounter   . Encounter for hospice care discussion   . Ileus, unspecified ([place])   . Nocturnal leg cramps 10/19/2016  . OAB (overactive bladder) 04/20/2016  . Pleural plaque 12/02/2015  . AAA (abdominal aortic aneurysm) without rupture ([place]) 10/23/2015  . CKD (chronic kidney disease) stage 3, GFR 30-59 ml/min ([facility]) 10/10/2015  . Vasomotor rhinitis 10/10/2015  . Incisional hernia 10/10/2015  . Right inguinal hernia 10/10/2015  . Malignant neoplasm of sigmoid colon ([place]) 11/19/2014  . MI (mitral incompetence) 09/09/2014  . Beat, premature ventricular 06/17/2014  . CAD (coronary artery disease) 05/09/2014  .  Acid reflux 05/09/2014  . Mixed hyperlipidemia 05/09/2014  . Moderate COPD (chronic obstructive pulmonary disease) ([place]) 12/07/2013    Palliative Care Plan    Recommendations/Plan:  Full comfort Sips and chips [facility] when bed [facility]. Continue current scheduled pain meds and benzodiazepines, as well as PRNs.   Goals of Care and Additional Recommendations:  Limitations on Scope of Treatment: Full Comfort Care  Code Status:  DNR  Prognosis:   < 2 weeks - unable to take PO, requires continuous pain medications for SBO   Discharge Planning:  Hospice facility  Care plan was discussed with patient and attending physician   Thank you for allowing the Palliative Medicine Team to assist in the care of this patient.  Total time spent:  25 min     Greater than 50%  of this time was spent counseling and coordinating care related to the above assessment and plan.  Florentina Jenny, PA-C Palliative Medicine  Please contact Palliative MedicineTeam phone at [phone] for questions and concerns between 7 am - 7 pm.   Please see AMION for individual provider pager numbers.

## 2017-05-13 NOTE — Progress Notes (Signed)
Follow up visit made to new hospice home referral. Patient seen lying in bd, asleep, family at bedside. Plan remains for transfer to the hospice home this morning. Report called to the hospice home, EMS notified for transport. Hospital care team updated and aware. Signed DNR in place in discharge packet, discharge summary faxed to referral.  Flo Shanks RN, BSN, [place] [facility], hospital Liaison [phone]

## 2017-05-17 DEATH — deceased

## 2017-05-18 ENCOUNTER — Telehealth: Payer: Self-pay | Admitting: *Deleted

## 2017-05-18 ENCOUNTER — Telehealth: Payer: Self-pay

## 2017-05-18 NOTE — Telephone Encounter (Signed)
Copied from [place] [phone]. Topic: General - Deceased Patient >> 2017-05-26  3:53 PM Vernona Rieger wrote: Reason for CRM: [facility] called and stated that the patient passed away 05-25-2023 at 9:23am   Route to department's PEC Pool.

## 2017-05-18 NOTE — Telephone Encounter (Signed)
Noted. Patient should be changed to deceased in the system.

## 2017-05-18 NOTE — Telephone Encounter (Signed)
Please change to deceased in system

## 2017-05-18 NOTE — Telephone Encounter (Signed)
Already received. Please make sure he is changed to deceased in the system. Thanks.

## 2017-05-18 NOTE — Telephone Encounter (Signed)
Copied from [place] [phone]. Topic: General - Deceased Patient >> 2017/05/26  3:53 PM Vernona Rieger wrote: Reason for CRM: [facility] called and stated that the patient passed away May 25, 2023 at 9:23am   Route to department's PEC Pool.

## 2017-05-19 NOTE — Telephone Encounter (Signed)
Patient has been changed to deceased in the system.

## 2017-06-06 ENCOUNTER — Ambulatory Visit: Payer: Medicare HMO | Admitting: Internal Medicine

## 2017-06-29 ENCOUNTER — Ambulatory Visit: Payer: Medicare HMO | Admitting: Family Medicine

## 2017-07-11 ENCOUNTER — Ambulatory Visit: Payer: Medicare HMO

## 2017-07-12 ENCOUNTER — Ambulatory Visit: Payer: Commercial Managed Care - HMO

## 2017-07-15 ENCOUNTER — Ambulatory Visit: Payer: Medicare HMO

## 2017-07-18 ENCOUNTER — Ambulatory Visit: Payer: Medicare HMO
